# Patient Record
Sex: Female | Born: 1960 | Race: White | Hispanic: No | Marital: Married | Smoking: Never smoker
Health system: Southern US, Community
[De-identification: ages and names within clinical notes are randomized; demographics above are authoritative.]

## PROBLEM LIST (undated history)

## (undated) DIAGNOSIS — F419 Anxiety disorder, unspecified: Secondary | ICD-10-CM

## (undated) DIAGNOSIS — Z8249 Family history of ischemic heart disease and other diseases of the circulatory system: Secondary | ICD-10-CM

## (undated) DIAGNOSIS — M1990 Unspecified osteoarthritis, unspecified site: Secondary | ICD-10-CM

## (undated) DIAGNOSIS — E785 Hyperlipidemia, unspecified: Secondary | ICD-10-CM

## (undated) DIAGNOSIS — I10 Essential (primary) hypertension: Secondary | ICD-10-CM

## (undated) DIAGNOSIS — T7840XA Allergy, unspecified, initial encounter: Secondary | ICD-10-CM

## (undated) DIAGNOSIS — E119 Type 2 diabetes mellitus without complications: Secondary | ICD-10-CM

## (undated) DIAGNOSIS — J45909 Unspecified asthma, uncomplicated: Secondary | ICD-10-CM

## (undated) DIAGNOSIS — J302 Other seasonal allergic rhinitis: Secondary | ICD-10-CM

## (undated) HISTORY — DX: Essential (primary) hypertension: I10

## (undated) HISTORY — DX: Unspecified osteoarthritis, unspecified site: M19.90

## (undated) HISTORY — DX: Hyperlipidemia, unspecified: E78.5

## (undated) HISTORY — DX: Allergy, unspecified, initial encounter: T78.40XA

## (undated) HISTORY — DX: Family history of ischemic heart disease and other diseases of the circulatory system: Z82.49

## (undated) HISTORY — PX: CHOLECYSTECTOMY: SHX55

## (undated) HISTORY — PX: EYE SURGERY: SHX253

## (undated) HISTORY — DX: Type 2 diabetes mellitus without complications: E11.9

## (undated) HISTORY — DX: Unspecified asthma, uncomplicated: J45.909

## (undated) HISTORY — DX: Other seasonal allergic rhinitis: J30.2

## (undated) HISTORY — DX: Anxiety disorder, unspecified: F41.9

---

## 2001-09-22 ENCOUNTER — Encounter: Admission: RE | Admit: 2001-09-22 | Discharge: 2001-09-22 | Payer: Self-pay

## 2001-09-22 ENCOUNTER — Encounter: Payer: Self-pay | Admitting: Family Medicine

## 2003-10-22 ENCOUNTER — Encounter: Admission: RE | Admit: 2003-10-22 | Discharge: 2004-01-20 | Payer: Self-pay | Admitting: Family Medicine

## 2004-11-18 ENCOUNTER — Encounter: Admission: RE | Admit: 2004-11-18 | Discharge: 2004-11-18 | Payer: Self-pay | Admitting: Family Medicine

## 2005-12-07 ENCOUNTER — Encounter: Admission: RE | Admit: 2005-12-07 | Discharge: 2005-12-07 | Payer: Self-pay | Admitting: Family Medicine

## 2007-01-12 ENCOUNTER — Encounter: Admission: RE | Admit: 2007-01-12 | Discharge: 2007-01-12 | Payer: Self-pay | Admitting: Family Medicine

## 2008-01-18 ENCOUNTER — Ambulatory Visit: Payer: Self-pay | Admitting: Pulmonary Disease

## 2008-01-18 DIAGNOSIS — J309 Allergic rhinitis, unspecified: Secondary | ICD-10-CM | POA: Insufficient documentation

## 2008-01-18 DIAGNOSIS — J45909 Unspecified asthma, uncomplicated: Secondary | ICD-10-CM | POA: Insufficient documentation

## 2008-01-18 DIAGNOSIS — R05 Cough: Secondary | ICD-10-CM

## 2008-01-18 DIAGNOSIS — Z9089 Acquired absence of other organs: Secondary | ICD-10-CM

## 2008-02-06 ENCOUNTER — Telehealth (INDEPENDENT_AMBULATORY_CARE_PROVIDER_SITE_OTHER): Payer: Self-pay | Admitting: *Deleted

## 2008-02-09 ENCOUNTER — Telehealth: Payer: Self-pay | Admitting: Pulmonary Disease

## 2008-02-23 ENCOUNTER — Encounter: Admission: RE | Admit: 2008-02-23 | Discharge: 2008-02-23 | Payer: Self-pay | Admitting: Family Medicine

## 2012-10-04 HISTORY — PX: COLONOSCOPY: SHX174

## 2013-07-24 ENCOUNTER — Ambulatory Visit: Payer: Self-pay | Admitting: *Deleted

## 2014-10-28 ENCOUNTER — Other Ambulatory Visit: Payer: Self-pay

## 2014-10-28 DIAGNOSIS — Z1231 Encounter for screening mammogram for malignant neoplasm of breast: Secondary | ICD-10-CM

## 2014-11-05 ENCOUNTER — Ambulatory Visit
Admission: RE | Admit: 2014-11-05 | Discharge: 2014-11-05 | Disposition: A | Discharge status: Home / Self Care | Payer: 59 | Source: Ambulatory Visit

## 2014-11-05 DIAGNOSIS — Z1231 Encounter for screening mammogram for malignant neoplasm of breast: Secondary | ICD-10-CM

## 2016-01-02 ENCOUNTER — Other Ambulatory Visit: Payer: Self-pay | Admitting: Nurse Practitioner

## 2016-01-02 ENCOUNTER — Ambulatory Visit
Admission: RE | Admit: 2016-01-02 | Discharge: 2016-01-02 | Disposition: A | Discharge status: Home / Self Care | Payer: Self-pay | Source: Ambulatory Visit | Attending: Internal Medicine | Admitting: Internal Medicine

## 2016-01-02 DIAGNOSIS — R05 Cough: Secondary | ICD-10-CM

## 2016-01-02 DIAGNOSIS — R053 Chronic cough: Secondary | ICD-10-CM

## 2016-03-25 ENCOUNTER — Other Ambulatory Visit: Payer: Self-pay | Admitting: Internal Medicine

## 2016-03-25 DIAGNOSIS — Z1231 Encounter for screening mammogram for malignant neoplasm of breast: Secondary | ICD-10-CM

## 2016-03-29 ENCOUNTER — Ambulatory Visit
Admission: RE | Admit: 2016-03-29 | Discharge: 2016-03-29 | Disposition: A | Discharge status: Home / Self Care | Payer: 59 | Source: Ambulatory Visit | Attending: Internal Medicine | Admitting: Internal Medicine

## 2016-03-29 DIAGNOSIS — Z1231 Encounter for screening mammogram for malignant neoplasm of breast: Secondary | ICD-10-CM

## 2017-03-17 ENCOUNTER — Telehealth: Payer: Self-pay | Admitting: General Practice

## 2017-03-17 NOTE — Telephone Encounter (Signed)
Please call patient with paper referral for new patient with dr Elvera Lennoxgherghe.

## 2017-03-21 NOTE — Telephone Encounter (Signed)
[facility] called to check the status of the referral for the patient. Please call [facility] at [phone] to advise and ask for Katie (overhead page)

## 2017-03-21 NOTE — Telephone Encounter (Signed)
Please advise thank you

## 2017-03-30 NOTE — Telephone Encounter (Signed)
Katie (med sec) [facility] calling checking on [facility]o referral. When calling her, please request to have her Overhead Paged. On lunch 12pm to 1pm.  She is re-faxing the referral a 3rd time.  Thank you,  -LL

## 2017-03-31 NOTE — Telephone Encounter (Signed)
Pt has been scheduled.  °

## 2017-04-10 NOTE — Progress Notes (Signed)
Cardiology Office Note   Date:  04/11/2017   ID:  Charlotte Horn, DOB [birth year], MRN [id number]  PCP:  Charlotte RanchSchoenhoff, Deborah D, MD  Cardiologist:   Charlotte Siiffany Morley, MD   Chief Complaint  Patient presents with  . New Evaluation    HIGH CHOLESTEROL AND FAMILY HISTORY OF HEART DISEASE      History of Present Illness: Charlotte Horn is a 56 y.o. female with diabetes, asthma and hyperlipidemia who presents for an evaluation of CAD risk.  Charlotte Horn last saw Dr. Constance GoltzSchoenhoff 04/05/17 and was referred to cardiology due to hyperlipidemia with statin intolerance.She has tried simvastatin, atorvastatin, rosuvastatin, pravastatin, and pitavastatin and been unable to tolerate them due to myalgias. She has has a strong family history of CAD.  Her father Was first heart attack at age 56. He subsequently had coronary artery bypass grafting and multiple stents. Last year her brother also died of a cardiac arrest. Both her father and brother had hyperlipidemia and diabetes.  She denies chest pain or shortness of breath.  She has occasional mild lower extremity edema that improves with elevation.  She notes mild orthostasis but no syncope.  Charlotte Horn does some exercise on the treadmill and weight machine at home but is not faithful.   She has no exertional symptoms.  She reports that she was diagnosed with diabetes in 2004 and endorses numbness and tingling in bilateral hands and feet.  Past Medical History:  Diagnosis Date  . Family history of early CAD 04/11/2017  . Hyperlipidemia 04/11/2017    No past surgical history on file.   Current Outpatient Prescriptions  Medication Sig Dispense Refill  . aspirin EC 81 MG tablet Take 81 mg by mouth daily.    . Alpha-Lipoic Acid 100 MG CAPS Take 1 capsule by mouth daily.    Marland Kitchen. b complex vitamins capsule Take 1 capsule by mouth daily.    . benzonatate (TESSALON) 200 MG capsule Take 1 capsule by mouth daily as needed.  0  . cholecalciferol (VITAMIN Horn)  1000 units tablet Take 5,000 Units by mouth daily.    . Cranberry 125 MG TABS Take 1 tablet by mouth daily.    Marland Kitchen. ezetimibe (ZETIA) 10 MG tablet Take 1 tablet (10 mg total) by mouth daily. 30 tablet 5  . fexofenadine (ALLEGRA) 180 MG tablet Take 1 tablet by mouth daily.    Marland Kitchen. glipiZIDE (GLUCOTROL XL) 5 MG 24 hr tablet Take 1 tablet by mouth daily.    Marland Kitchen. HUMALOG 100 UNIT/ML injection See admin instructions. per sliding scale  0  . ipratropium (ATROVENT) 0.06 % nasal spray Place 1 spray into both nostrils.    Marland Kitchen. losartan (COZAAR) 100 MG tablet Take 1 tablet by mouth daily.    . metFORMIN (GLUCOPHAGE) 500 MG tablet Take 1,000 mg by mouth 2 (two) times daily.    . Multiple Vitamin (THERA) TABS Take 1 tablet by mouth daily.    . Omega-3 1000 MG CAPS Take 1 capsule by mouth daily.    . Omega-3 Fatty Acids (FISH OIL) 1000 MG CAPS Take 1 capsule by mouth daily.    Marland Kitchen. PROAIR HFA 108 (90 Base) MCG/ACT inhaler Inhale 2 puffs into the lungs as needed.  0  . sertraline (ZOLOFT) 100 MG tablet Take 1 tablet by mouth daily.    . traZODone (DESYREL) 100 MG tablet Take 1 tablet by mouth at bedtime.     No current facility-administered medications for this visit.  Allergies:   Prednisone    Social History:  The patient  reports that she has never smoked. She has never used smokeless tobacco.   Family History:  The patient's family history includes Heart attack in her father; Heart failure in her paternal grandmother; Kidney failure in her father; Ovarian cancer in her maternal grandmother and mother; Parkinson's disease in her maternal grandfather; Sudden Cardiac Death in her brother.    ROS:  Please see the history of present illness.   Otherwise, review of systems are positive for none.   All other systems are reviewed and negative.    PHYSICAL EXAM: VS:  BP 103/70 (BP Location: Right Arm)   Pulse 67   Ht 5\' 9"  (1.753 m)   Wt 77.1 kg (170 lb)   BMI 25.10 kg/m  , BMI Body mass index is 25.1  kg/m. GENERAL:  Well appearing HEENT:  Pupils equal round and reactive, fundi not visualized, oral mucosa unremarkable NECK:  No jugular venous distention, waveform within normal limits, carotid upstroke brisk and symmetric, no bruits, no thyromegaly LYMPHATICS:  No cervical adenopathy LUNGS:  Clear to auscultation bilaterally HEART:  RRR.  PMI not displaced or sustained,S1 and S2 within normal limits, no S3, no S4, no clicks, no rubs, no murmurs ABD:  Flat, positive bowel sounds normal in frequency in pitch, no bruits, no rebound, no guarding, no midline pulsatile mass, no hepatomegaly, no splenomegaly EXT:  2 plus pulses throughout, no edema, no cyanosis no clubbing SKIN:  No rashes no nodules NEURO:  Cranial nerves II through XII grossly intact, motor grossly intact throughout PSYCH:  Cognitively intact, oriented to person place and time   EKG:  EKG is ordered today. The ekg ordered today demonstrates sinus rhythm.  Rate 67 bpm.  First degree AV block    Recent Labs: No results found for requested labs within last 8760 hours.   02/01/17:  Sodium 141, potassium 4.8, BUN 14, creatinine 0.73 AST 16, ALT 17 Total cholesterol 253, triglycerides 166, HDL 59, LDL 161 Hemoglobin A1c 7.0% TSH 4.31 WBC 4.7, hemoglobin 11.5, hematocrit 34.9, platelets 248   Lipid Panel No results found for: CHOL, TRIG, HDL, CHOLHDL, VLDL, LDLCALC, LDLDIRECT    Wt Readings from Last 3 Encounters:  04/11/17 77.1 kg (170 lb)  01/18/08 85.8 kg (189 lb 4 oz)      ASSESSMENT AND PLAN:  # Hyperlipidemia: # Family history of CAD: Ms. Dot Been has Hyperlipidemia and has been intolerant to several statins. We will try Zetia 10 mg daily. She does not have any evidence of familial hyperlipidemia. If she does not tolerate Zetia or her lipids do not get to goal we will obtain a coronary calcium score to determine if she has any coronary artery disease.   # CV Risk prevention: Continue aspirin 81mg  daily.   Consider calcium score as above.   Current medicines are reviewed at length with the patient today.  The patient does not have concerns regarding medicines.  The following changes have been made:  Start Zetia 10 mg daily   Labs/ tests ordered today include:   Orders Placed This Encounter  Procedures  . Lipid panel     Disposition:   FU with Ronnette Rump C. Duke Salvia, MD, [facility] in 2 months.    This note was written with the assistance of speech recognition software.  Please excuse any transcriptional errors.  Signed, Toua Stites C. Duke Salvia, MD, [facility]  04/11/2017 4:38 PM    [facility]

## 2017-04-11 ENCOUNTER — Encounter: Payer: Self-pay | Admitting: Cardiovascular Disease

## 2017-04-11 ENCOUNTER — Encounter (INDEPENDENT_AMBULATORY_CARE_PROVIDER_SITE_OTHER): Payer: Self-pay

## 2017-04-11 ENCOUNTER — Ambulatory Visit (INDEPENDENT_AMBULATORY_CARE_PROVIDER_SITE_OTHER): Payer: 59 | Admitting: Cardiovascular Disease

## 2017-04-11 VITALS — BP 103/70 | HR 67 | Ht 69.0 in | Wt 170.0 lb

## 2017-04-11 DIAGNOSIS — E785 Hyperlipidemia, unspecified: Secondary | ICD-10-CM

## 2017-04-11 DIAGNOSIS — E1169 Type 2 diabetes mellitus with other specified complication: Secondary | ICD-10-CM | POA: Insufficient documentation

## 2017-04-11 DIAGNOSIS — Z8249 Family history of ischemic heart disease and other diseases of the circulatory system: Secondary | ICD-10-CM

## 2017-04-11 DIAGNOSIS — E7800 Pure hypercholesterolemia, unspecified: Secondary | ICD-10-CM

## 2017-04-11 DIAGNOSIS — E782 Mixed hyperlipidemia: Secondary | ICD-10-CM | POA: Insufficient documentation

## 2017-04-11 HISTORY — DX: Family history of ischemic heart disease and other diseases of the circulatory system: Z82.49

## 2017-04-11 HISTORY — DX: Hyperlipidemia, unspecified: E78.5

## 2017-04-11 MED ORDER — EZETIMIBE 10 MG PO TABS: 10.0000 mg | ORAL_TABLET | Freq: Every day | ORAL | 5 refills | Status: DC

## 2017-04-11 NOTE — Patient Instructions (Signed)
Medication Instructions:  START ZETIA 10 MG DAILY  Labwork: FASTING LIPID 6 WEEKS AFTER STARTING ZETIA   Testing/Procedures: NONE  Follow-Up: Your physician recommends that you schedule a follow-up appointment in: 2 MONTH OV  If you need a refill on your cardiac medications before your next appointment, please call your pharmacy.

## 2017-04-11 NOTE — Addendum Note (Signed)
Addended by: Regis BillPRATT, Allison Silva B on: 04/11/2017 06:21 PM   Modules accepted: Orders

## 2017-05-05 ENCOUNTER — Other Ambulatory Visit: Payer: Self-pay | Admitting: Internal Medicine

## 2017-05-05 DIAGNOSIS — Z1231 Encounter for screening mammogram for malignant neoplasm of breast: Secondary | ICD-10-CM

## 2017-05-11 ENCOUNTER — Ambulatory Visit: Payer: 59

## 2017-05-12 ENCOUNTER — Ambulatory Visit
Admission: RE | Admit: 2017-05-12 | Discharge: 2017-05-12 | Disposition: A | Discharge status: Home / Self Care | Payer: 59 | Source: Ambulatory Visit | Attending: Internal Medicine | Admitting: Internal Medicine

## 2017-05-12 DIAGNOSIS — Z1231 Encounter for screening mammogram for malignant neoplasm of breast: Secondary | ICD-10-CM

## 2017-06-01 LAB — LIPID PANEL
CHOL/HDL RATIO: 3.5 ratio (ref 0.0–4.4)
Cholesterol, Total: 195 mg/dL (ref 100–199)
HDL: 55 mg/dL (ref >39)
LDL Calculated: 105 mg/dL — ABNORMAL HIGH (ref 0–99)
Triglycerides: 173 mg/dL — ABNORMAL HIGH (ref 0–149)
VLDL Cholesterol Cal: 35 mg/dL (ref 5–40)

## 2017-06-10 ENCOUNTER — Ambulatory Visit (INDEPENDENT_AMBULATORY_CARE_PROVIDER_SITE_OTHER): Payer: 59 | Admitting: Internal Medicine

## 2017-06-10 ENCOUNTER — Encounter: Payer: Self-pay | Admitting: Internal Medicine

## 2017-06-10 VITALS — BP 118/64 | HR 74 | Ht 69.0 in | Wt 165.8 lb

## 2017-06-10 DIAGNOSIS — E1165 Type 2 diabetes mellitus with hyperglycemia: Secondary | ICD-10-CM | POA: Diagnosis not present

## 2017-06-10 DIAGNOSIS — E1142 Type 2 diabetes mellitus with diabetic polyneuropathy: Secondary | ICD-10-CM | POA: Diagnosis not present

## 2017-06-10 LAB — POCT GLYCOSYLATED HEMOGLOBIN (HGB A1C): Hemoglobin A1C: 7.8

## 2017-06-10 MED ORDER — FREESTYLE LIBRE SENSOR SYSTEM MISC
1.0000 | 11 refills | Status: DC
Start: 2017-06-10 — End: 2018-02-16

## 2017-06-10 MED ORDER — GLIPIZIDE ER 5 MG PO TB24: 5.0000 mg | ORAL_TABLET | Freq: Every day | ORAL | 3 refills | Status: DC

## 2017-06-10 MED ORDER — METFORMIN HCL ER 500 MG PO TB24: 1000.0000 mg | ORAL_TABLET | Freq: Two times a day (BID) | ORAL | 3 refills | Status: DC

## 2017-06-10 MED ORDER — FREESTYLE LIBRE READER DEVI: 1.0000 | Freq: Three times a day (TID) | 1 refills | Status: DC

## 2017-06-10 NOTE — Addendum Note (Signed)
Addended by: Lear NgMARTIN, MISTY L on: 06/10/2017 09:15 AM   Modules accepted: Orders

## 2017-06-10 NOTE — Patient Instructions (Addendum)
Please move all Metformin ER at dinnertime (2000 mg).  Please continue: - Glipizide 5 mg with dinner  Please let me know if the sugars are consistently <80 or >200.  Please return in 1.5 months with your sugar log.   PATIENT INSTRUCTIONS FOR TYPE 2 DIABETES:  **Please join MyChart!** - see attached instructions about how to join if you have not done so already.  DIET AND EXERCISE Diet and exercise is an important part of diabetic treatment.  We recommended aerobic exercise in the form of brisk walking (working between 40-60% of maximal aerobic capacity, similar to brisk walking) for 150 minutes per week (such as 30 minutes five days per week) along with 3 times per week performing 'resistance' training (using various gauge rubber tubes with handles) 5-10 exercises involving the major muscle groups (upper body, lower body and core) performing 10-15 repetitions (or near fatigue) each exercise. Start at half the above goal but build slowly to reach the above goals. If limited by weight, joint pain, or disability, we recommend daily walking in a swimming pool with water up to waist to reduce pressure from joints while allow for adequate exercise.    BLOOD GLUCOSES Monitoring your blood glucoses is important for continued management of your diabetes. Please check your blood glucoses 2-4 times a day: fasting, before meals and at bedtime (you can rotate these measurements - e.g. one day check before the 3 meals, the next day check before 2 of the meals and before bedtime, etc.).   HYPOGLYCEMIA (low blood sugar) Hypoglycemia is usually a reaction to not eating, exercising, or taking too much insulin/ other diabetes drugs.  Symptoms include tremors, sweating, hunger, confusion, headache, etc. Treat IMMEDIATELY with 15 grams of Carbs: . 4 glucose tablets .  cup regular juice/soda . 2 tablespoons raisins . 4 teaspoons sugar . 1 tablespoon honey Recheck blood glucose in 15 mins and repeat above if  still symptomatic/blood glucose <100.  RECOMMENDATIONS TO REDUCE YOUR RISK OF DIABETIC COMPLICATIONS: * Take your prescribed MEDICATION(S) * Follow a DIABETIC diet: Complex carbs, fiber rich foods, (monounsaturated and polyunsaturated) fats * AVOID saturated/trans fats, high fat foods, >2,300 mg salt per day. * EXERCISE at least 5 times a week for 30 minutes or preferably daily.  * DO NOT SMOKE OR DRINK more than 1 drink a day. * Check your FEET every day. Do not wear tightfitting shoes. Contact us if you develop an ulcer * See your EYE doctor once a year or more if needed * Get a FLU shot once a year * Get a PNEUMONIA vaccine once before and once after age 59 years  GOALS:  * Your Hemoglobin A1c of <7%  * fasting sugars need to be <130 * after meals sugars need to be <180 (2h after you start eating) * Your Systolic BP should be 140 or lower  * Your Diastolic BP should be 80 or lower  * Your HDL (Good Cholesterol) should be 40 or higher  * Your LDL (Bad Cholesterol) should be 100 or lower. * Your Triglycerides should be 150 or lower  * Your Urine microalbumin (kidney function) should be <30 * Your Body Mass Index should be 25 or lower    Please consider the following ways to cut down carbs and fat and increase fiber and micronutrients in your diet: - substitute whole grain for white bread or pasta - substitute brown rice for white rice - substitute 90-calorie flat bread pieces for slices of bread when possible -  substitute sweet potatoes or yams for white potatoes - substitute humus for margarine - substitute tofu for cheese when possible - substitute almond or rice milk for regular milk (would not drink soy milk daily due to concern for soy estrogen influence on breast cancer risk) - substitute dark chocolate for other sweets when possible - substitute water - can add lemon or orange slices for taste - for diet sodas (artificial sweeteners will trick your body that you can eat  sweets without getting calories and will lead you to overeating and weight gain in the long run) - do not skip breakfast or other meals (this will slow down the metabolism and will result in more weight gain over time)  - can try smoothies made from fruit and almond/rice milk in am instead of regular breakfast - can also try old-fashioned (not instant) oatmeal made with almond/rice milk in am - order the dressing on the side when eating salad at a restaurant (pour less than half of the dressing on the salad) - eat as little meat as possible - can try juicing, but should not forget that juicing will get rid of the fiber, so would alternate with eating raw veg./fruits or drinking smoothies - use as little oil as possible, even when using olive oil - can dress a salad with a mix of balsamic vinegar and lemon juice, for e.g. - use agave nectar, stevia sugar, or regular sugar rather than artificial sweateners - steam or broil/roast veggies  - snack on veggies/fruit/nuts (unsalted, preferably) when possible, rather than processed foods - reduce or eliminate aspartame in diet (it is in diet sodas, chewing gum, etc) Read the labels!  Try to read Dr. Katherina RightNeal Barnard's book: "Program for Reversing Diabetes" for other ideas for healthy eating.

## 2017-06-10 NOTE — Progress Notes (Signed)
Patient ID: Charlotte Horn, female   DOB: [birth year], 56 y.o.   MRN: [id number]   HPI: Charlotte Horn is a 56 y.o.-year-old female, referred by her PCP, Schoenhoff, Harrington Challengereborah D, MD for management of DM2, dx in 2004, non-insulin-dependent, uncontrolled, without complications.  In 02/2017 >> she had bronchitis >> Prednisone po and inhaled >> sugars higher in 05 and 03/2017.  Last hemoglobin A1c was: 05/12/2017: HbA1c 9.1% 02/01/2017: HbA1c 7.0% No results found for: HGBA1C  Pt is on a regimen of: - Metformin ER 1000 mg 2x a day, with meals - Glipizide XL 5 mg with dinner She tried Byetta, Bydureon (1 year) - but had diarrhea. She had very low CBGs with regular Glipizide.  Pt checks her sugars 1x a day and they are: - am: 139-180, 237 (sweets) - 2h after b'fast: n/c - before lunch: n/c - 2h after lunch: 150-160 - before dinner: n/c - 2h after dinner: n/c - bedtime: (late dinner): 150-160s - nighttime: n/c No lows. Lowest sugar was 45 (lowest ever) >> last 6 mo: 110 (Bydureon - stopped this in 12/2016); she has hypoglycemia awareness at 60-90s.  Highest sugar was 237 (but 385 with prednisone). Normally <200.  Glucometer: One Touch Ultra mini  Pt's meals are: - Breakfast: b'fast bar (<15g carbs), almonds, cereal - Lunch: sandwich, veggies - Dinner: meat + veggies + rarely starch - Snacks:  Diet sodas usually lower her sugar.  - no CKD, last BUN/creatinine:  02/01/2017: Glucose 185, BUN/creatinine 14/0.73, GFR 93/107. No results found for: BUN, CREATININE  On losartan.  - She does have hyperlipidemia; last set of lipids: Lab Results  Component Value Date   CHOL 195 06/01/2017   HDL 55 06/01/2017   LDLCALC 105 (H) 06/01/2017   TRIG 173 (H) 06/01/2017   CHOLHDL 3.5 06/01/2017  Previously: 02/01/2017: 253/166/59/161  On ezetimibe (started 04/2017), fish/flax oil, co-Q10. Managed by cardiology..   - last eye exam was in 07/2017 >> No DR. Dr. Audie BoxFontaine.  - + numbness  and tingling in her feet. On alpha lipoic acid 300 mg twice a day, B complex.   On ASA 81 mg daily.   Pt has FH of DM in father, PGM, 2 brothers, nephews  Latest TSH: 02/01/2017: 4.310   ROS: Constitutional: no weight gain/loss, no fatigue, no subjective hyperthermia/hypothermia Eyes: no blurry vision, no xerophthalmia ENT: no sore throat, no nodules palpated in throat, no dysphagia/odynophagia, no hoarseness Cardiovascular: no CP/SOB/palpitations/leg swelling Respiratory: no cough/SOB Gastrointestinal: no N/V/D/C Musculoskeletal: no muscle/joint aches Skin: no rashes Neurological: no tremors/+ numbness/+ tingling/dizziness Psychiatric: no depression/+ anxiety  Past Medical History:  Diagnosis Date  . Family history of early CAD 04/11/2017  . Hyperlipidemia 04/11/2017   PSx H: - multiple eye Sx's: [id number] - C-section 1996 - Gall Bladder Sx 1984  Social History   Social History  . Marital status: Married    Spouse name: N/A  . Number of children: 1   Occupational History  . Reimbursement analyst   Social History Main Topics  . Smoking status: Never Smoker  . Smokeless tobacco: Never Used  . Alcohol use No  . Drug use: No   Current Outpatient Prescriptions on File Prior to Visit  Medication Sig Dispense Refill  . Alpha-Lipoic Acid 100 MG CAPS Take 1 capsule by mouth daily.    Marland Kitchen. aspirin EC 81 MG tablet Take 81 mg by mouth daily.    Marland Kitchen. b complex vitamins capsule Take 1 capsule by mouth daily.    .Marland Kitchen  benzonatate (TESSALON) 200 MG capsule Take 1 capsule by mouth daily as needed.  0  . cholecalciferol (VITAMIN D) 1000 units tablet Take 5,000 Units by mouth daily.    . Cranberry 125 MG TABS Take 1 tablet by mouth daily.    Marland Kitchen ezetimibe (ZETIA) 10 MG tablet Take 1 tablet (10 mg total) by mouth daily. 30 tablet 5  . fexofenadine (ALLEGRA) 180 MG tablet Take 1 tablet by mouth daily.    Marland Kitchen glipiZIDE (GLUCOTROL XL) 5 MG 24 hr tablet Take 1 tablet by mouth daily.    Marland Kitchen ipratropium  (ATROVENT) 0.06 % nasal spray Place 1 spray into both nostrils.    Marland Kitchen losartan (COZAAR) 100 MG tablet Take 1 tablet by mouth daily.    . Multiple Vitamin (THERA) TABS Take 1 tablet by mouth daily.    . Omega-3 Fatty Acids (FISH OIL) 1000 MG CAPS Take 2 capsules by mouth daily.     . sertraline (ZOLOFT) 100 MG tablet Take 1 tablet by mouth daily.    . traZODone (DESYREL) 100 MG tablet Take 1 tablet by mouth at bedtime.    Marland Kitchen PROAIR HFA 108 (90 Base) MCG/ACT inhaler Inhale 2 puffs into the lungs as needed.  0   No current facility-administered medications on file prior to visit.    Allergies  Allergen Reactions  . Statins Other (See Comments)    MUSCLE ACHES  . Prednisone    Family History  Problem Relation Age of Onset  . Ovarian cancer Mother   . Heart attack Father   . Kidney failure Father   . Sudden Cardiac Death Brother   . Ovarian cancer Maternal Grandmother   . Parkinson's disease Maternal Grandfather   . Heart failure Paternal Grandmother   . Breast cancer Neg Hx     PE: BP 118/64   Pulse 74   Ht  (1.753 m)   Wt 165 lb 12.8 oz (75.2 kg)   SpO2 96%   BMI 24.48 kg/m  Wt Readings from Last 3 Encounters:  06/10/17 165 lb 12.8 oz (75.2 kg)  04/11/17 170 lb (77.1 kg)  01/18/08 189 lb 4 oz (85.8 kg)   Constitutional: normal weight, in NAD Eyes: PERRLA, EOMI, no exophthalmos, + B palpebral ptosis ENT: moist mucous membranes, no thyromegaly, no cervical lymphadenopathy Cardiovascular: RRR, No MRG Respiratory: CTA B Gastrointestinal: abdomen soft, NT, ND, BS+ Musculoskeletal: no deformities, strength intact in all 4 Skin: moist, warm, no rashes Neurological: no tremor with outstretched hands, DTR normal in all 4  ASSESSMENT: 1. 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 Patient with long-standing, uncontrolled diabetes, on oral antidiabetic regimen, with worse control in last 4 mo 2/2 steroids and coming  off GLP1 R agonist. After her steroids were stopped, she saw a decrease in CBGs but they are still quite high in am. Later in the day, she usually checks after meals, and they are in the 150-160s, his close to target.  - We discussed about improving her evening meals but also I advised her to move all the metformin ER to dinnertime since practice have been shown to be able to decrease fasting CBGs by up to 30 points - She is also telling me that she frequently forgets glipizide XL so we discussed about the importance of getting this in before dinner, also  - we also discussed about the possibility of adding Trulicity or an SGLT2 inhibitor. We may need to do  this at next visit, if sugars not improving. - I suggested to:  Patient Instructions  Please move all Metformin ER at dinnertime (2000 mg).  Please continue: - Glipizide 5 mg with dinner  Please let me know if the sugars are consistently <80 or >200.  Please return in 1.5 months with your sugar log.   - advised her to start checking sugars at different times of the day - check once a day, rotating checks - given sugar log and advised how to fill it and to bring it at next appt  - given foot care handout and explained the principles  - given instructions for hypoglycemia management "15-15 rule"  - advised for yearly eye exams >> she is UTD - Return to clinic in 1.5 mo with sugar l og   2. 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  - We will continue with the above supplements  Carlus Pavlov, MD PhD [facility]

## 2017-06-14 ENCOUNTER — Ambulatory Visit (INDEPENDENT_AMBULATORY_CARE_PROVIDER_SITE_OTHER): Payer: 59 | Admitting: Cardiovascular Disease

## 2017-06-14 ENCOUNTER — Encounter: Payer: Self-pay | Admitting: Cardiovascular Disease

## 2017-06-14 VITALS — BP 100/64 | HR 62 | Ht 69.0 in | Wt 167.0 lb

## 2017-06-14 DIAGNOSIS — E7800 Pure hypercholesterolemia, unspecified: Secondary | ICD-10-CM | POA: Diagnosis not present

## 2017-06-14 DIAGNOSIS — Z8249 Family history of ischemic heart disease and other diseases of the circulatory system: Secondary | ICD-10-CM | POA: Diagnosis not present

## 2017-06-14 NOTE — Progress Notes (Signed)
Cardiology Office Note   Date:  06/14/2017   ID:  Charlotte Horn, DOB May 07, 1961, MRN [id number]  PCP:  Kendrick Ranch, MD  Cardiologist:   Chilton Si, MD   Chief Complaint  Patient presents with  . Follow-up    foot tingling and numbness, cramping, wonders if side affect os zetia       History of Present Illness: Charlotte Horn is a 56 y.o. female with diabetes, asthma and hyperlipidemia who presents for follow up.  She was initially seen 04/2017 for an evaluation of CAD risk.  Charlotte Horn last saw Dr. Constance Goltz 04/05/17 and was referred to cardiology due to hyperlipidemia with statin intolerance.She has tried simvastatin, atorvastatin, rosuvastatin, pravastatin, and pitavastatin and been unable to tolerate them due to myalgias. She has has a strong family history of CAD.  Her father had an MI at age 66 and her brother died of cardiac arrest.  At her last appointment she was started on Zetia. Her LDL decreased from 161 to 469.  Since starting Zetia she has noted some muscle cramps.  She also notes increased numbness and tingling in her fingers and toes.  The symptoms are not as bad as when she was taking statins but still bothersome.  She has been working on her blood glucose levels and has been more physically active.  She notes some pain in her back but no chest pain or shortness of breath.  She has some mild lower extremity edema but no orthopnea or PND.    Past Medical History:  Diagnosis Date  . Family history of early CAD 04/11/2017  . Hyperlipidemia 04/11/2017    No past surgical history on file.   Current Outpatient Prescriptions  Medication Sig Dispense Refill  . Alpha-Lipoic Acid 100 MG CAPS Take 1 capsule by mouth daily.    Marland Kitchen aspirin EC 81 MG tablet Take 81 mg by mouth daily.    Marland Kitchen b complex vitamins capsule Take 1 capsule by mouth daily.    . benzonatate (TESSALON) 200 MG capsule Take 1 capsule by mouth daily as needed.  0  . cholecalciferol  (VITAMIN D) 1000 units tablet Take 5,000 Units by mouth daily.    Marland Kitchen co-enzyme Q-10 50 MG capsule Take 100 mg by mouth daily.    . Continuous Blood Gluc Receiver (FREESTYLE LIBRE READER) DEVI 1 Device by Does not apply route 3 (three) times daily. 1 Device 1  . Continuous Blood Gluc Sensor (FREESTYLE LIBRE SENSOR SYSTEM) MISC 1 Device by Does not apply route every 30 (thirty) days. 3 each 11  . Cranberry 125 MG TABS Take 1 tablet by mouth daily.    . cyclobenzaprine (FLEXERIL) 10 MG tablet Take 10 mg by mouth 3 (three) times daily as needed for muscle spasms.    Marland Kitchen ezetimibe (ZETIA) 10 MG tablet Take 1 tablet (10 mg total) by mouth daily. 30 tablet 5  . fexofenadine (ALLEGRA) 180 MG tablet Take 1 tablet by mouth daily.    . Flaxseed, Linseed, (FLAXSEED OIL) 1000 MG CAPS Take 1 capsule by mouth daily.    Marland Kitchen glipiZIDE (GLUCOTROL XL) 5 MG 24 hr tablet Take 1 tablet (5 mg total) by mouth daily. 90 tablet 3  . ipratropium (ATROVENT) 0.06 % nasal spray Place 1 spray into both nostrils.    Marland Kitchen levocetirizine (XYZAL) 5 MG tablet Take 5 mg by mouth every evening.    Marland Kitchen losartan (COZAAR) 100 MG tablet Take 1 tablet by mouth daily.    Marland Kitchen  Magnesium Oxide (MAG-200 PO) Take 1 tablet by mouth daily.    . metFORMIN (GLUCOPHAGE-XR) 500 MG 24 hr tablet Take 2 tablets (1,000 mg total) by mouth 2 (two) times daily. 360 tablet 3  . methocarbamol (ROBAXIN) 500 MG tablet Take 500 mg by mouth every 8 (eight) hours as needed for muscle spasms.    . Multiple Vitamin (THERA) TABS Take 1 tablet by mouth daily.    . Omega-3 Fatty Acids (FISH OIL) 1000 MG CAPS Take 2 capsules by mouth daily.     Marland Kitchen PROAIR HFA 108 (90 Base) MCG/ACT inhaler Inhale 2 puffs into the lungs as needed.  0  . sertraline (ZOLOFT) 100 MG tablet Take 1 tablet by mouth daily.    . traZODone (DESYREL) 100 MG tablet Take 1 tablet by mouth at bedtime.     No current facility-administered medications for this visit.     Allergies:   Statins and Prednisone     Social History:  The patient  reports that she has never smoked. She has never used smokeless tobacco.   Family History:  The patient's family history includes Heart attack in her father; Heart failure in her paternal grandmother; Kidney failure in her father; Ovarian cancer in her maternal grandmother and mother; Parkinson's disease in her maternal grandfather; Sudden Cardiac Death in her brother.    ROS:  Please see the history of present illness.   Otherwise, review of systems are positive for none.   All other systems are reviewed and negative.    PHYSICAL EXAM: VS:  BP 100/64   Pulse 62   Ht  (1.753 m)   Wt 75.8 kg (167 lb)   BMI 24.66 kg/m  , BMI Body mass index is 24.66 kg/m. GENERAL:  Well appearing.  No acute distress HEENT: Pupils equal round and reactive, fundi not visualized, oral mucosa unremarkable NECK:  No jugular venous distention, waveform within normal limits, carotid upstroke brisk and symmetric, no bruits, no thyromegaly LUNGS:  Clear to auscultation bilaterally HEART:  RRR.  PMI not displaced or sustained,S1 and S2 within normal limits, no S3, no S4, no clicks, no rubs, no murmurs ABD:  Flat, positive bowel sounds normal in frequency in pitch, no bruits, no rebound, no guarding, no midline pulsatile mass, no hepatomegaly, no splenomegaly EXT:  2 plus pulses throughout, no edema, no cyanosis no clubbing SKIN:  No rashes no nodules NEURO:  Cranial nerves II through XII grossly intact, motor grossly intact throughout PSYCH:  Cognitively intact, oriented to person place and time   EKG:  EKG is not ordered today. The ekg ordered today demonstrates sinus rhythm.  Rate 67 bpm.  First degree AV block    Recent Labs: No results found for requested labs within last 8760 hours.   02/01/17:  Sodium 141, potassium 4.8, BUN 14, creatinine 0.73 AST 16, ALT 17 Total cholesterol 253, triglycerides 166, HDL 59, LDL 161 Hemoglobin A1c 7.0% TSH 4.31 WBC 4.7,  hemoglobin 11.5, hematocrit 34.9, platelets 248   Lipid Panel    Component Value Date/Time   CHOL 195 06/01/2017 0804   TRIG 173 (H) 06/01/2017 0804   HDL 55 06/01/2017 0804   CHOLHDL 3.5 06/01/2017 0804   LDLCALC 105 (H) 06/01/2017 0804      Wt Readings from Last 3 Encounters:  06/14/17 75.8 kg (167 lb)  06/10/17 75.2 kg (165 lb 12.8 oz)  04/11/17 77.1 kg (170 lb)      ASSESSMENT AND PLAN:  # Hyperlipidemia: #  Family history of CAD: Charlotte Horn's LDL improved significantly. Zetia. However she has some mild muscle aches. She reports that these are tolerable for now. We will get a coronary calcium score to better understand her risk.  Based on this will determine whether she wants to continue on Zetia or consider a PCSK9 inhibitor.   # CV Risk prevention: Continue aspirin 81mg  daily.  Calcium score as above.   Current medicines are reviewed at length with the patient today.  The patient does not have concerns regarding medicines.  The following changes have been made:  None  Labs/ tests ordered today include:   Orders Placed This Encounter  Procedures  . CT CARDIAC SCORING     Disposition:   FU with Akela Pocius C. Duke Salviaandolph, MD, [facility] in 3 months.    This note was written with the assistance of speech recognition software.  Please excuse any transcriptional errors.  Signed, Adalai Perl C. Duke Salviaandolph, MD, [facility]  06/14/2017 8:17 AM     [facility]

## 2017-06-14 NOTE — Patient Instructions (Addendum)
Medication Instructions:  Your physician recommends that you continue on your current medications as directed. Please refer to the Current Medication list given to you today.  Labwork: NONE  Testing/Procedures: CORONARY CALCIUM SCORE, OUT OF POCKET COST $150  Follow-Up: Your physician recommends that you schedule a follow-up appointment in: 3 MONTH OV   If you need a refill on your cardiac medications before your next appointment, please call your pharmacy.

## 2017-06-28 ENCOUNTER — Ambulatory Visit (INDEPENDENT_AMBULATORY_CARE_PROVIDER_SITE_OTHER)
Admission: RE | Admit: 2017-06-28 | Discharge: 2017-06-28 | Disposition: A | Discharge status: Home / Self Care | Payer: Self-pay | Source: Ambulatory Visit | Attending: Cardiovascular Disease | Admitting: Cardiovascular Disease

## 2017-06-28 DIAGNOSIS — Z8249 Family history of ischemic heart disease and other diseases of the circulatory system: Secondary | ICD-10-CM

## 2017-07-01 ENCOUNTER — Telehealth: Payer: Self-pay | Admitting: Internal Medicine

## 2017-07-01 ENCOUNTER — Other Ambulatory Visit: Payer: Self-pay

## 2017-07-01 MED ORDER — FREESTYLE LIBRE READER DEVI: 1.0000 | Freq: Three times a day (TID) | 1 refills | Status: DC

## 2017-07-01 NOTE — Telephone Encounter (Signed)
Called patient and let her know that I sent over another script for the [place] device to read the sensors. She stated that she was able to get her sensors, just not the reader.

## 2017-07-01 NOTE — Telephone Encounter (Signed)
MEDICATION: Freestyle Libre reader/meter  PHARMACY: [id number] [place], [place] - [address] [phone] ([phone] (Fax)   IS THIS A 90 DAY SUPPLY : no  IS PATIENT OUT OF MEDICATION: yes  IF NOT; HOW MUCH IS LEFT:   LAST APPOINTMENT DATE: /14/2018  NEXT APPOINTMENT DATE:@Visit  date not found  OTHER COMMENTS: Patient already has the sensors   **Let patient know to contact pharmacy at the end of the day to make sure medication is ready. **  ** Please notify patient to allow 48-72 hours to process**  **Encourage patient to contact the pharmacy for refills or they can request refills through [facility]**

## 2017-07-01 NOTE — Telephone Encounter (Signed)
Please review

## 2017-07-05 ENCOUNTER — Telehealth: Payer: Self-pay | Admitting: *Deleted

## 2017-07-05 MED ORDER — EZETIMIBE 10 MG PO TABS: 10.0000 mg | ORAL_TABLET | Freq: Every day | ORAL | 3 refills | Status: DC

## 2017-07-05 NOTE — Telephone Encounter (Signed)
-----   Message from Chilton Si, MD sent at 06/30/2017  3:42 PM EDT ----- Coronary calcium score is 0. This is great news. She has no CAD.  Based on this I would not start a PCSK9 inhibitor.  Reasonable to start Zetia 10 mg daily.

## 2017-07-05 NOTE — Telephone Encounter (Signed)
Advised patient of results and refilled Zetia as requested

## 2017-07-25 ENCOUNTER — Other Ambulatory Visit: Payer: Self-pay | Admitting: Internal Medicine

## 2017-07-25 ENCOUNTER — Ambulatory Visit
Admission: RE | Admit: 2017-07-25 | Discharge: 2017-07-25 | Disposition: A | Discharge status: Home / Self Care | Payer: 59 | Source: Ambulatory Visit | Attending: Internal Medicine | Admitting: Internal Medicine

## 2017-07-25 DIAGNOSIS — R52 Pain, unspecified: Secondary | ICD-10-CM

## 2017-07-25 DIAGNOSIS — T1490XA Injury, unspecified, initial encounter: Secondary | ICD-10-CM

## 2017-08-08 ENCOUNTER — Telehealth: Payer: Self-pay | Admitting: Internal Medicine

## 2017-08-08 ENCOUNTER — Telehealth: Payer: Self-pay

## 2017-08-08 NOTE — Telephone Encounter (Signed)
Called to see if patient would agree to take Trulicity medication, will send in once she agrees. Left message and advised patient to call back to let me know.

## 2017-08-08 NOTE — Telephone Encounter (Signed)
Called patient she states that her blood sugars have been staying in the 200's throughout the day.  Today: 11:57 am- 194 (fasting) 1:35 pm- 263  Yesterday: 12:00 pm- 214 5:10 pm- 244 8:42 pm- 275  November 3rd: 11:37 am- 151 2:33 pm- 211 7:05 pm- 237 Patient fasting that morning was 219.  Please advise, patient had to move her appointment with you, and does not see you until December now. Thank you!

## 2017-08-08 NOTE — Telephone Encounter (Signed)
Called to see if patient would agree to take Trulicity medication, will send in once she agrees. Left message and advised patient to call back to let me know.  

## 2017-08-08 NOTE — Telephone Encounter (Signed)
Patient stated that she will take the trulicity, and to let her know when to send it.

## 2017-08-08 NOTE — Telephone Encounter (Signed)
Would she agree to try Trulicity 0.75 mg once a week?  This is similar to the [place] that she tried in the past, but usually better tolerated. If so, let send one box of 4 pens but with 1 refill, but let us know before she refills it as may be we can use the higher dose at that time.

## 2017-08-08 NOTE — Telephone Encounter (Signed)
Raynelle FanningJulie, can you please send this?

## 2017-08-08 NOTE — Telephone Encounter (Signed)
Patient asked you to give her a call, she stated her b/s has been very high. Call her on her work [phone]

## 2017-08-09 ENCOUNTER — Other Ambulatory Visit: Payer: Self-pay

## 2017-08-09 MED ORDER — DULAGLUTIDE 0.75 MG/0.5ML ~~LOC~~ SOAJ: SUBCUTANEOUS | 2 refills | Status: DC

## 2017-08-09 NOTE — Telephone Encounter (Signed)
Submitted

## 2017-08-11 ENCOUNTER — Ambulatory Visit: Payer: 59 | Admitting: Internal Medicine

## 2017-08-12 ENCOUNTER — Telehealth: Payer: Self-pay | Admitting: Internal Medicine

## 2017-08-12 NOTE — Telephone Encounter (Signed)
Charlotte Horn  [facility] [address] is calling on the status of PA for trulicity P# [phone]  Please advise

## 2017-08-12 NOTE — Telephone Encounter (Signed)
Patient's prescription for Trulicity needs pre-authorization for her insurance company. [facility] [address] told patient they sent pre-authorization over for Dr-but have not received a response yet. Please send pre-authorization to [facility].

## 2017-08-16 NOTE — Telephone Encounter (Signed)
OK for PA

## 2017-08-16 NOTE — Telephone Encounter (Signed)
Did we decide to do a PA? I can do one if needed, just did not know Thanks!

## 2017-08-17 NOTE — Telephone Encounter (Signed)
Submitted PA via Covermymeds today.

## 2017-08-18 ENCOUNTER — Other Ambulatory Visit: Payer: Self-pay

## 2017-08-18 MED ORDER — DULAGLUTIDE 0.75 MG/0.5ML ~~LOC~~ SOAJ: SUBCUTANEOUS | 2 refills | Status: DC

## 2017-09-20 ENCOUNTER — Encounter: Payer: Self-pay | Admitting: Cardiovascular Disease

## 2017-09-20 ENCOUNTER — Encounter (INDEPENDENT_AMBULATORY_CARE_PROVIDER_SITE_OTHER): Payer: Self-pay

## 2017-09-20 ENCOUNTER — Ambulatory Visit: Payer: 59 | Admitting: Cardiovascular Disease

## 2017-09-20 VITALS — BP 110/56 | HR 82 | Ht 69.0 in | Wt 168.0 lb

## 2017-09-20 DIAGNOSIS — Z8249 Family history of ischemic heart disease and other diseases of the circulatory system: Secondary | ICD-10-CM | POA: Diagnosis not present

## 2017-09-20 DIAGNOSIS — E7800 Pure hypercholesterolemia, unspecified: Secondary | ICD-10-CM

## 2017-09-20 NOTE — Progress Notes (Signed)
Cardiology Office Note   Date:  09/20/2017   ID:  MIONNA ADVINCULA, DOB 03-07-1961, MRN [id number]  PCP:  Kendrick Ranch, MD  Cardiologist:   Chilton Si, MD   Chief Complaint  Horn presents with  . Follow-up      History of Present Illness: Charlotte Horn is a 56 y.o. female with diabetes, hypertension, asthma and hyperlipidemia who presents for follow up.  Charlotte Horn was initially seen 04/2017 for an evaluation of CAD risk.  Charlotte Horn last saw Dr. Constance Goltz 04/05/17 and was referred to cardiology due to hyperlipidemia with statin intolerance.  Charlotte Horn has tried simvastatin, atorvastatin, rosuvastatin, pravastatin, and pitavastatin and been unable to tolerate them due to myalgias. Charlotte Horn has has a strong family history of CAD.  Her father had an MI at age 66 and her brother died of cardiac arrest.  Charlotte Horn was started on Zetia and her LDL decreased from 161 to 409.  However Charlotte Horn developed muscle cramps.  After her last appointment Charlotte Horn was referred for a coronary calcium score 06/28/17 that showed a score of 0.  Since her last appointment Charlotte Horn has been feeling well.  Charlotte Horn restarted Zetia and has mild cramps in Charlotte left leg but they are tolerable.  Charlotte Horn has not noted any chest pain or shortness of breath.  Charlotte Horn is been very physically active because Charlotte Horn is currently remodeling her home.  Charlotte Horn has no exertional symptoms but feels very fatigued at Charlotte end of Charlotte weekend after working on Charlotte home for several hours each day.  Charlotte Horn had a fall in October and has some swelling in her knee.  Charlotte Horn has been participating in physical therapy for this.  Charlotte Horn also continues to have neuropathy that bothers her.  Charlotte Horn is otherwise well and without complaint today.  Charlotte Horn has not noted any lower extremity edema, orthopnea, or PND.  Her blood pressure has been well-controlled at home and Charlotte Horn denies any lightheadedness or dizziness.   Past Medical History:  Diagnosis Date  . Family history of early  CAD 04/11/2017  . Hyperlipidemia 04/11/2017    History reviewed. No pertinent surgical history.   Current Outpatient Medications  Medication Sig Dispense Refill  . Alpha-Lipoic Acid 100 MG CAPS Take 1 capsule by mouth daily.    Marland Kitchen aspirin EC 81 MG tablet Take 81 mg by mouth daily.    Marland Kitchen b complex vitamins capsule Take 1 capsule by mouth daily.    . benzonatate (TESSALON) 200 MG capsule Take 1 capsule by mouth daily as needed.  0  . cholecalciferol (VITAMIN D) 1000 units tablet Take 5,000 Units by mouth daily.    Marland Kitchen co-enzyme Q-10 50 MG capsule Take 100 mg by mouth daily.    . Continuous Blood Gluc Receiver (FREESTYLE LIBRE READER) DEVI 1 Device by Does not apply route 3 (three) times daily. 1 Device 1  . Continuous Blood Gluc Sensor (FREESTYLE LIBRE SENSOR SYSTEM) MISC 1 Device by Does not apply route every 30 (thirty) days. 3 each 11  . Cranberry 125 MG TABS Take 1 tablet by mouth daily.    . cyclobenzaprine (FLEXERIL) 10 MG tablet Take 10 mg by mouth 3 (three) times daily as needed for muscle spasms.    . Dulaglutide (TRULICITY) 0.75 MG/0.5ML SOPN Inject 0.75 mg weekly 4 pen 2  . ezetimibe (ZETIA) 10 MG tablet Take 1 tablet (10 mg total) by mouth daily. 90 tablet 3  . fexofenadine (ALLEGRA) 180 MG tablet Take 1  tablet by mouth daily.    . Flaxseed, Linseed, (FLAXSEED OIL) 1000 MG CAPS Take 1 capsule by mouth daily.    Marland Kitchen. glipiZIDE (GLUCOTROL XL) 5 MG 24 hr tablet Take 1 tablet (5 mg total) by mouth daily. 90 tablet 3  . ipratropium (ATROVENT) 0.06 % nasal spray Place 1 spray into both nostrils.    Marland Kitchen. losartan (COZAAR) 100 MG tablet Take 1 tablet by mouth daily.    . Magnesium Oxide (MAG-200 PO) Take 1 tablet by mouth daily.    . metFORMIN (GLUCOPHAGE-XR) 500 MG 24 hr tablet Take 2 tablets (1,000 mg total) by mouth 2 (two) times daily. 360 tablet 3  . methocarbamol (ROBAXIN) 500 MG tablet Take 500 mg by mouth every 8 (eight) hours as needed for muscle spasms.    . Multiple Vitamin (THERA) TABS  Take 1 tablet by mouth daily.    . Omega-3 Fatty Acids (FISH OIL) 1000 MG CAPS Take 2 capsules by mouth daily.     Marland Kitchen. PROAIR HFA 108 (90 Base) MCG/ACT inhaler Inhale 2 puffs into Charlotte lungs as needed.  0  . sertraline (ZOLOFT) 100 MG tablet Take 1 tablet by mouth daily.    . traZODone (DESYREL) 100 MG tablet Take 1 tablet by mouth at bedtime.    Marland Kitchen. levocetirizine (XYZAL) 5 MG tablet Take 5 mg by mouth every evening.     No current facility-administered medications for this visit.     Allergies:   Statins and Prednisone    Social History:  Charlotte Horn  reports that  has never smoked. Charlotte Horn has never used smokeless tobacco.   Family History:  Charlotte Horn's family history includes Heart attack in her father; Heart failure in her paternal grandmother; Kidney failure in her father; Ovarian cancer in her maternal grandmother and mother; Parkinson's disease in her maternal grandfather; Sudden Cardiac Death in her brother.    ROS:  Please see Charlotte history of present illness.   Otherwise, review of systems are positive for none.   All other systems are reviewed and negative.    PHYSICAL EXAM: VS:  BP (!) 110/56   Pulse 82   Ht 5\' 9"  (1.753 m)   Wt 168 lb (76.2 kg)   BMI 24.81 kg/m  , BMI Body mass index is 24.81 kg/m. GENERAL:  Well appearing HEENT: Pupils equal round and reactive, fundi not visualized, oral mucosa unremarkable NECK:  No jugular venous distention, waveform within normal limits, carotid upstroke brisk and symmetric, no bruits, no thyromegaly LUNGS:  Clear to auscultation bilaterally HEART:  RRR.  PMI not displaced or sustained,S1 and S2 within normal limits, no S3, no S4, no clicks, no rubs, no murmurs ABD:  Flat, positive bowel sounds normal in frequency in pitch, no bruits, no rebound, no guarding, no midline pulsatile mass, no hepatomegaly, no splenomegaly EXT:  2 plus pulses throughout, no edema, no cyanosis no clubbing SKIN:  No rashes no nodules NEURO:  Cranial nerves II  through XII grossly intact, motor grossly intact throughout PSYCH:  Cognitively intact, oriented to person place and time    EKG:  EKG is not ordered today. Charlotte ekg ordered today demonstrates sinus rhythm.  Rate 67 bpm.  First degree AV block    Recent Labs: No results found for requested labs within last 8760 hours.   02/01/17:  Sodium 141, potassium 4.8, BUN 14, creatinine 0.73 AST 16, ALT 17 Total cholesterol 253, triglycerides 166, HDL 59, LDL 161 Hemoglobin A1c 7.0% TSH 4.31 WBC  4.7, hemoglobin 11.5, hematocrit 34.9, platelets 248   Lipid Panel    Component Value Date/Time   CHOL 195 06/01/2017 0804   TRIG 173 (H) 06/01/2017 0804   HDL 55 06/01/2017 0804   CHOLHDL 3.5 06/01/2017 0804   LDLCALC 105 (H) 06/01/2017 0804      Wt Readings from Last 3 Encounters:  09/20/17 168 lb (76.2 kg)  06/14/17 167 lb (75.8 kg)  06/10/17 165 lb 12.8 oz (75.2 kg)      ASSESSMENT AND PLAN:  # Hyperlipidemia: # Family history of CAD: Charlotte Horn's LDL improved significantly on Zetia.  Charlotte Horn initially stopped it but has been tolerating it after restarting.  Charlotte Horn had a coronary calcium score of 0.  Therefore, if Charlotte Horn develops worsening symptoms it would be reasonable to stop it.  # CV Risk prevention: Continue aspirin 81mg  daily.  Continue exercise.   Current medicines are reviewed at length with Charlotte Horn today.  Charlotte Horn does not have concerns regarding medicines.  Charlotte following changes have been made:  None  Labs/ tests ordered today include:   No orders of Charlotte defined types were placed in this encounter.    Disposition:   FU with Keiji Melland C. Duke Salviaandolph, MD, [facility] in 2 years.   This note was written with Charlotte assistance of speech recognition software.  Please excuse any transcriptional errors.  Signed, Yulisa Chirico C. Duke Salviaandolph, MD, [facility]  09/20/2017 8:16 AM    [place]

## 2017-09-20 NOTE — Patient Instructions (Signed)
Medication Instructions:  Your physician recommends that you continue on your current medications as directed. Please refer to the Current Medication list given to you today.  Labwork: none  Testing/Procedures: none  Follow-Up: Your physician wants you to follow-up in: 2 year ov  You will receive a reminder letter in the mail two months in advance. If you don't receive a letter, please call our office to schedule the follow-up appointment.  If you need a refill on your cardiac medications before your next appointment, please call your pharmacy.  

## 2017-09-29 ENCOUNTER — Ambulatory Visit: Payer: 59 | Admitting: Internal Medicine

## 2017-12-02 ENCOUNTER — Encounter: Payer: Self-pay | Admitting: Internal Medicine

## 2017-12-02 ENCOUNTER — Ambulatory Visit: Payer: 59 | Admitting: Internal Medicine

## 2017-12-02 VITALS — BP 105/59 | HR 81 | Temp 97.4°F | Resp 16 | Ht 70.0 in | Wt 170.0 lb

## 2017-12-02 DIAGNOSIS — E1165 Type 2 diabetes mellitus with hyperglycemia: Secondary | ICD-10-CM

## 2017-12-02 DIAGNOSIS — E1142 Type 2 diabetes mellitus with diabetic polyneuropathy: Secondary | ICD-10-CM

## 2017-12-02 LAB — POCT GLYCOSYLATED HEMOGLOBIN (HGB A1C): Hemoglobin A1C: 7.6

## 2017-12-02 MED ORDER — LOSARTAN POTASSIUM 100 MG PO TABS: 100.0000 mg | ORAL_TABLET | Freq: Every day | ORAL | 3 refills | Status: DC

## 2017-12-02 MED ORDER — DULAGLUTIDE 1.5 MG/0.5ML ~~LOC~~ SOAJ: SUBCUTANEOUS | 11 refills | Status: DC

## 2017-12-02 NOTE — Progress Notes (Signed)
Patient ID: Charlotte Horn, female   DOB: Oct 23, 1960, 57 y.o.   MRN: [id number]   HPI: Charlotte Horn is a 57 y.o.-year-old female,  returning for follow-up for DM2, dx in 2004, non-insulin-dependent, uncontrolled, without long-term complications.  Last visit 6 months ago.  She had a URI >> steroids few weeks ago.  Last hemoglobin A1c was: Lab Results  Component Value Date   HGBA1C 7.8 06/10/2017  05/12/2017: HbA1c 9.1% 02/01/2017: HbA1c 7.0%  Pt is on a regimen of: - Metformin ER 1000 mg 2x a day, with meals >> 2000 mg with dinner - Glipizide 5 XL mg with dinner  - moved from bedtime - Trulicity 0.75 mg weekly - started 08/2017 She tried Byetta, Bydureon (1 year) - but had diarrhea. She had very low CBGs with regular Glipizide.  Pt checks her sugars many times a day - on Freestyle Libre CGM 10 days: - am: 139-180, 237 (sweets)  >> 120-180 (more 150-180s), 250 - 2h after b'fast: n/c - before lunch: n/c >> 150s - 2h after lunch: 150-160 >> n/c - before dinner: n/c >> 150s - 2h after dinner: n/c - bedtime: (late dinner): 150-160s >> 150-180 - nighttime: n/c Lowest sugar was 45 (lowest ever) >> 110 >> 70; she has hypoglycemia awareness in the 60s-90s. Highest sugar was 237 (but 385 with prednisone) >> 337.  Glucometer: One Touch Ultra mini  Pt's meals are: - Breakfast: b'fast bar (<15g carbs), almonds, cereal - Lunch: sandwich, veggies - Dinner: meat + veggies + rarely starch - Snacks:  Diet sodas usually lower her sugar.  -No CKD, last BUN/creatinine:  02/01/2017: Glucose 185, BUN/creatinine 14/0.73, GFR 93/107. No results found for: BUN, CREATININE  On losartan.  -+ Hyperlipidemia; last set of lipids: Lab Results  Component Value Date   CHOL 195 06/01/2017   HDL 55 06/01/2017   LDLCALC 105 (H) 06/01/2017   TRIG 173 (H) 06/01/2017   CHOLHDL 3.5 06/01/2017  Previously: 02/01/2017: 253/166/59/161  On ezetimibe (started 04/2017), fish/flax oil, co-Q10.   Managed by cardiology.  - last eye exam was in 07/2017: No DR R. Dr. Audie Box.  -+ Numbness and tingling in her feet.  On alpha lipoic acid, B complex.  On ASA 81  Pt has FH of DM in father, PGM, 2 brothers, nephews  Latest TSH: 02/01/2017: 4.310   ROS: Constitutional: no weight gain/no weight loss, no fatigue, no subjective hyperthermia, no subjective hypothermia Eyes: no blurry vision, no xerophthalmia ENT: no sore throat, no nodules palpated in throat, no dysphagia, no odynophagia, no hoarseness Cardiovascular: no CP/no SOB/no palpitations/no leg swelling Respiratory: no cough/no SOB/no wheezing Gastrointestinal: no N/no V/no D/no C/no acid reflux Musculoskeletal: no muscle aches/no joint aches Skin: no rashes, no hair loss Neurological: no tremors/+ numbness/+ tingling/no dizziness  I reviewed pt's medications, allergies, PMH, social hx, family hx, and changes were documented in the history of present illness. Otherwise, unchanged from my initial visit note.  Past Medical History:  Diagnosis Date  . Family history of early CAD 04/11/2017  . Hyperlipidemia 04/11/2017   PSx H: - multiple eye Sx's: [id number] - C-section 1996 - Gall Bladder Sx 1984  Social History   Social History  . Marital status: Married    Spouse name: N/A  . Number of children: 1   Occupational History  . Reimbursement analyst   Social History Main Topics  . Smoking status: Never Smoker  . Smokeless tobacco: Never Used  . Alcohol use No  . Drug use: No  Current Outpatient Medications on File Prior to Visit  Medication Sig Dispense Refill  . Alpha-Lipoic Acid 100 MG CAPS Take 1 capsule by mouth daily.    Marland Kitchen. aspirin EC 81 MG tablet Take 81 mg by mouth daily.    Marland Kitchen. b complex vitamins capsule Take 1 capsule by mouth daily.    . benzonatate (TESSALON) 200 MG capsule Take 1 capsule by mouth daily as needed.  0  . cholecalciferol (VITAMIN D) 1000 units tablet Take 5,000 Units by mouth daily.    Marland Kitchen.  co-enzyme Q-10 50 MG capsule Take 100 mg by mouth daily.    . Continuous Blood Gluc Receiver (FREESTYLE LIBRE READER) DEVI 1 Device by Does not apply route 3 (three) times daily. 1 Device 1  . Continuous Blood Gluc Sensor (FREESTYLE LIBRE SENSOR SYSTEM) MISC 1 Device by Does not apply route every 30 (thirty) days. 3 each 11  . Cranberry 125 MG TABS Take 1 tablet by mouth daily.    . cyclobenzaprine (FLEXERIL) 10 MG tablet Take 10 mg by mouth 3 (three) times daily as needed for muscle spasms.    . Dulaglutide (TRULICITY) 0.75 MG/0.5ML SOPN Inject 0.75 mg weekly 4 pen 2  . fexofenadine (ALLEGRA) 180 MG tablet Take 1 tablet by mouth daily.    . Flaxseed, Linseed, (FLAXSEED OIL) 1000 MG CAPS Take 1 capsule by mouth daily.    Marland Kitchen. glipiZIDE (GLUCOTROL XL) 5 MG 24 hr tablet Take 1 tablet (5 mg total) by mouth daily. 90 tablet 3  . ipratropium (ATROVENT) 0.06 % nasal spray Place 1 spray into both nostrils.    Marland Kitchen. losartan (COZAAR) 100 MG tablet Take 1 tablet by mouth daily.    . Magnesium Oxide (MAG-200 PO) Take 1 tablet by mouth daily.    . metFORMIN (GLUCOPHAGE-XR) 500 MG 24 hr tablet Take 2 tablets (1,000 mg total) by mouth 2 (two) times daily. 360 tablet 3  . methocarbamol (ROBAXIN) 500 MG tablet Take 500 mg by mouth every 8 (eight) hours as needed for muscle spasms.    . Multiple Vitamin (THERA) TABS Take 1 tablet by mouth daily.    . Omega-3 Fatty Acids (FISH OIL) 1000 MG CAPS Take 2 capsules by mouth daily.     Marland Kitchen. PROAIR HFA 108 (90 Base) MCG/ACT inhaler Inhale 2 puffs into the lungs as needed.  0  . sertraline (ZOLOFT) 100 MG tablet Take 1 tablet by mouth daily.    . traZODone (DESYREL) 100 MG tablet Take 1 tablet by mouth at bedtime.    Marland Kitchen. ezetimibe (ZETIA) 10 MG tablet Take 1 tablet (10 mg total) by mouth daily. 90 tablet 3   No current facility-administered medications on file prior to visit.    Allergies  Allergen Reactions  . Statins Other (See Comments)    MUSCLE ACHES  . Prednisone     Family History  Problem Relation Age of Onset  . Ovarian cancer Mother   . Heart attack Father   . Kidney failure Father   . Sudden Cardiac Death Brother   . Ovarian cancer Maternal Grandmother   . Parkinson's disease Maternal Grandfather   . Heart failure Paternal Grandmother   . Breast cancer Neg Hx     PE: BP (!) 105/59 (BP Location: Left Arm, Patient Position: Sitting, Cuff Size: Small)   Pulse 81   Temp (!) 97.4 F (36.3 C)   Resp 16   Ht 5\' 10"  (1.778 m)   Wt 170 lb (77.1 kg)  SpO2 97%   BMI 24.39 kg/m  Wt Readings from Last 3 Encounters:  12/02/17 170 lb (77.1 kg)  09/20/17 168 lb (76.2 kg)  06/14/17 167 lb (75.8 kg)   Constitutional: overweight, in NAD Eyes: PERRLA, EOMI, no exophthalmos, + B palpebral ptosis ENT: moist mucous membranes, no thyromegaly, no cervical lymphadenopathy Cardiovascular: RRR, No MRG Respiratory: CTA B Gastrointestinal: abdomen soft, NT, ND, BS+ Musculoskeletal: no deformities, strength intact in all 4 Skin: moist, warm, no rashes Neurological: no tremor with outstretched hands, DTR normal in all 4  ASSESSMENT: 1. 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 Patient with long-standing, uncontrolled, diabetes, on oral antidiabetic regimen, with worse control at last visit due to steroids and coming off GLP-1 receptor agonist.  At last visit, we discussed about improving her evening meals, and I also advised her to move all the metformin to dinnertime s to decrease her a.m. sugars.  She was forgetting the glipizide XL frequently, so we discussed about the need to try to be consistent with taking this. Since last visit, as her sugars worsened, we started Trulicity >> she is still on the low dose.  - sugars are almost all above goal >> will need to increase Trulicity to 1.5 mg weekly - I suggested to:  Patient Instructions  Please continue: - Metformin ER 2000 mg with dinner  -  Glipizide XL 5 mg before dinner  Please increase Trulicity to 1.5 mg weekly.  Please return in 3 months with your sugar log.   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

## 2017-12-02 NOTE — Patient Instructions (Addendum)
Please continue: - Metformin ER 2000 mg with dinner  - Glipizide XL 5 mg before dinner  Please increase Trulicity to 1.5 mg weekly.  Please return in 3 months with your sugar log.

## 2018-02-09 ENCOUNTER — Other Ambulatory Visit: Payer: Self-pay | Admitting: *Deleted

## 2018-02-09 MED ORDER — DULAGLUTIDE 1.5 MG/0.5ML ~~LOC~~ SOAJ: SUBCUTANEOUS | 11 refills | Status: DC

## 2018-02-16 ENCOUNTER — Telehealth: Payer: Self-pay | Admitting: Internal Medicine

## 2018-02-16 MED ORDER — FREESTYLE LIBRE 14 DAY READER DEVI: 1.0000 | 0 refills | Status: DC

## 2018-02-16 MED ORDER — FREESTYLE LIBRE 14 DAY SENSOR MISC: 1.0000 | 5 refills | Status: DC

## 2018-02-16 NOTE — Telephone Encounter (Signed)
Dulaglutide (TRULICITY) 1.5 MG/0.5ML SOPN  Patient needs new prescription sent into [facility] stating 90 day supply  Freestyle Libre meter She is currently on 10 day prescription and needs it sent as a 14 day  [id number] - [place], [place] - [address]  Please advise

## 2018-02-16 NOTE — Telephone Encounter (Signed)
Trulicity refill was sent to [facility] on 02/09/18. See meds. New Rxs for [facility] 14 day reader and sensors sent. See meds.

## 2018-02-20 ENCOUNTER — Encounter: Payer: Self-pay | Admitting: Internal Medicine

## 2018-02-20 NOTE — Progress Notes (Signed)
Received labs from PCP from 02/15/2018: HbA1c 7.0%, improved from: Lab Results  Component Value Date   HGBA1C 7.6 12/02/2017  Normal CBC with differential, except slightly high eosinophiles CMP normal with the exception of glucose 162, BUN/creatinine 10/0.66, GFR 99 Vitamin D 74 Lipids: 207/174/58/114 Hep C virus antibody less than 0.1 we will scan the results

## 2018-03-10 ENCOUNTER — Encounter: Payer: Self-pay | Admitting: Internal Medicine

## 2018-03-10 ENCOUNTER — Ambulatory Visit: Payer: Managed Care, Other (non HMO) | Admitting: Internal Medicine

## 2018-03-10 VITALS — BP 100/58 | Ht 69.0 in | Wt 168.7 lb

## 2018-03-10 DIAGNOSIS — E1165 Type 2 diabetes mellitus with hyperglycemia: Secondary | ICD-10-CM

## 2018-03-10 DIAGNOSIS — E7800 Pure hypercholesterolemia, unspecified: Secondary | ICD-10-CM | POA: Diagnosis not present

## 2018-03-10 DIAGNOSIS — E1142 Type 2 diabetes mellitus with diabetic polyneuropathy: Secondary | ICD-10-CM

## 2018-03-10 MED ORDER — GLIPIZIDE 5 MG PO TABS: 5.0000 mg | ORAL_TABLET | Freq: Two times a day (BID) | ORAL | 3 refills | Status: DC

## 2018-03-10 NOTE — Progress Notes (Signed)
Patient ID: Charlotte Horn, female   DOB: [birth year], 57 y.o.   MRN: [id number]   HPI: Charlotte Horn is a 57 y.o.-year-old female,  returning for follow-up for DM2, dx in 2004, non-insulin-dependent, uncontrolled, without long-term complications.  Last visit 3 months ago.  Last hemoglobin A1c was:  02/15/2018: HbA1c 7.0% Lab Results  Component Value Date   HGBA1C 7.6 12/02/2017   HGBA1C 7.8 06/10/2017  05/12/2017: HbA1c 9.1% 02/01/2017: HbA1c 7.0%  Pt is on a regimen of: - Metformin ER 1000 mg 2x a day, with meals >> 2000 mg with dinner - Glipizide 5 XL mg before dinner >> at bedtime (tried different timings >> this worked best) - Trulicity 1.5 mg weekly- started 08/2017 She tried Byetta, Bydureon (1 year) - but had diarrhea. She had very low CBGs with regular Glipizide.  Pt checks her sugars many times a day - on Freestyle Libre CGM 10 days: - am: 139-180, 237 (sweets)  >> 120-180 (more 150-180s), 250 - 2h after b'fast: n/c - before lunch: n/c >> 150s - 2h after lunch: 150-160 >> n/c - before dinner: n/c >> 150s - 2h after dinner: n/c - bedtime: (late dinner): 150-160s >> 150-180 - nighttime: n/c Lowest sugar was 45 (lowest ever) >> 110 >> 70 >> 94; she has hypoglycemia awareness in the 60s-90s. Highest sugar was 337 >> 300.  Freestyle Libre CGM parameters: - average: 143 +/- 38.6  - coefficient of variation: 27% (19-25%) - time in range:  - low (<70): 1% - normal (70-180): 85% - high (>180): 14%  Glucometer: One Touch Ultra mini  Pt's meals are: - Breakfast: b'fast bar (<15g carbs), almonds, cereal - Lunch:  veggies, popcorn - Dinner: meat + veggies + rarely starch - Snacks:  Diet sodas usually lower her sugar.  Received labs from PCP from 02/15/2018: Normal CBC with differential, except slightly high eosinophiles CMP normal with the exception of glucose 162, BUN/creatinine 10/0.66, GFR 99 Vitamin D 74 Lipids: 207/174/58/114 Hep C virus antibody less than  0.1 we will scan the results  -No CKD, last BUN/creatinine:  02/01/2017: Glucose 185, BUN/creatinine 14/0.73, GFR 93/107. No results found for: BUN, CREATININE  On losartan.  -+ HL a; last set of lipids: Lab Results  Component Value Date   CHOL 195 06/01/2017   HDL 55 06/01/2017   LDLCALC 105 (H) 06/01/2017   TRIG 173 (H) 06/01/2017   CHOLHDL 3.5 06/01/2017  Previously: 02/01/2017: 253/166/59/161  She is off ezetimibe due to muscle aches, but continues fish oil, CoQ10.  This is managed by cardiology.  - last eye exam was in 07/2017: No DR . Dr. Audie Box.  - + Numbness and tingling in her feet.  On alpha lipoic acid and B complex  On ASA 81.  Pt has FH of DM in father, PGM, 2 brothers, nephews  Latest TSH: 02/01/2017: Normal, 4.3  ROS: Constitutional: no weight gain/no weight loss, no fatigue, no subjective hyperthermia, no subjective hypothermia Eyes: no blurry vision, no xerophthalmia ENT: no sore throat, no nodules palpated in throat, no dysphagia, no odynophagia, no hoarseness Cardiovascular: no CP/no SOB/no palpitations/no leg swelling Respiratory: no cough/no SOB/no wheezing Gastrointestinal: no N/no V/no D/no C/no acid reflux Musculoskeletal: no muscle aches/no joint aches Skin: no rashes, no hair loss Neurological: no tremors/+ numbness/+ tingling/no dizziness  I reviewed pt's medications, allergies, PMH, social hx, family hx, and changes were documented in the history of present illness. Otherwise, unchanged from my initial visit note.  Past Medical History:  Diagnosis Date  . Family history of early CAD 04/11/2017  . Hyperlipidemia 04/11/2017   PSx H: - multiple eye Sx's: 1610-9604 - C-section 1996 - Gall Bladder Sx 1984  Social History   Social History  . Marital status: Married    Spouse name: N/A  . Number of children: 1   Occupational History  . Reimbursement analyst   Social History Main Topics  . Smoking status: Never Smoker  . Smokeless  tobacco: Never Used  . Alcohol use No  . Drug use: No   Current Outpatient Medications on File Prior to Visit  Medication Sig Dispense Refill  . Alpha-Lipoic Acid 100 MG CAPS Take 1 capsule by mouth daily.    Marland Kitchen aspirin EC 81 MG tablet Take 81 mg by mouth daily.    Marland Kitchen b complex vitamins capsule Take 1 capsule by mouth daily.    . benzonatate (TESSALON) 200 MG capsule Take 1 capsule by mouth daily as needed.  0  . cholecalciferol (VITAMIN D) 1000 units tablet Take 5,000 Units by mouth daily.    Marland Kitchen co-enzyme Q-10 50 MG capsule Take 100 mg by mouth daily.    . Continuous Blood Gluc Receiver (FREESTYLE LIBRE 14 DAY READER) DEVI 1 each by Does not apply route as directed. 1 Device 0  . Continuous Blood Gluc Sensor (FREESTYLE LIBRE 14 DAY SENSOR) MISC 1 each by Does not apply route as directed. 3 each 5  . Cranberry 125 MG TABS Take 1 tablet by mouth daily.    . cyclobenzaprine (FLEXERIL) 10 MG tablet Take 10 mg by mouth 3 (three) times daily as needed for muscle spasms.    . Dulaglutide (TRULICITY) 1.5 MG/0.5ML SOPN Inject 1.5 mg weekly under skin 4 pen 11  . ezetimibe (ZETIA) 10 MG tablet Take 1 tablet (10 mg total) by mouth daily. 90 tablet 3  . fexofenadine (ALLEGRA) 180 MG tablet Take 1 tablet by mouth daily.    . Flaxseed, Linseed, (FLAXSEED OIL) 1000 MG CAPS Take 1 capsule by mouth daily.    Marland Kitchen glipiZIDE (GLUCOTROL XL) 5 MG 24 hr tablet Take 1 tablet (5 mg total) by mouth daily. 90 tablet 3  . ipratropium (ATROVENT) 0.06 % nasal spray Place 1 spray into both nostrils.    Marland Kitchen losartan (COZAAR) 100 MG tablet Take 1 tablet (100 mg total) by mouth daily. 90 tablet 3  . Magnesium Oxide (MAG-200 PO) Take 1 tablet by mouth daily.    . metFORMIN (GLUCOPHAGE-XR) 500 MG 24 hr tablet Take 2 tablets (1,000 mg total) by mouth 2 (two) times daily. 360 tablet 3  . methocarbamol (ROBAXIN) 500 MG tablet Take 500 mg by mouth every 8 (eight) hours as needed for muscle spasms.    . Multiple Vitamin (THERA) TABS  Take 1 tablet by mouth daily.    . Omega-3 Fatty Acids (FISH OIL) 1000 MG CAPS Take 2 capsules by mouth daily.     Marland Kitchen PROAIR HFA 108 (90 Base) MCG/ACT inhaler Inhale 2 puffs into the lungs as needed.  0  . sertraline (ZOLOFT) 100 MG tablet Take 1 tablet by mouth daily.    . traZODone (DESYREL) 100 MG tablet Take 1 tablet by mouth at bedtime.     No current facility-administered medications on file prior to visit.    Allergies  Allergen Reactions  . Statins Other (See Comments)    MUSCLE ACHES  . Prednisone    Family History  Problem Relation Age of Onset  . Ovarian cancer  Mother   . Heart attack Father   . Kidney failure Father   . Sudden Cardiac Death Brother   . Ovarian cancer Maternal Grandmother   . Parkinson's disease Maternal Grandfather   . Heart failure Paternal Grandmother   . Breast cancer Neg Hx     PE: BP (!) 100/58   Ht 5\' 9"  (1.753 m)   Wt 168 lb 11.2 oz (76.5 kg)   BMI 24.91 kg/m  O2 Sat 98% Wt Readings from Last 3 Encounters:  03/10/18 168 lb 11.2 oz (76.5 kg)  12/02/17 170 lb (77.1 kg)  09/20/17 168 lb (76.2 kg)   Constitutional: overweight, in NAD Eyes: PERRLA, EOMI, no exophthalmos, + bilateral palpebral ptosis ENT: moist mucous membranes, no thyromegaly, no cervical lymphadenopathy Cardiovascular: RRR, No MRG Respiratory: CTA B Gastrointestinal: abdomen soft, NT, ND, BS+ Musculoskeletal: no deformities, strength intact in all 4 Skin: moist, warm, no rashes Neurological: no tremor with outstretched hands, DTR normal in all 4  ASSESSMENT: 1. 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 HL  PLAN:  1. Patient with long-standing, uncontrolled, type 2 diabetes, on oral antidiabetic regimen to which we added GLP-1 receptor agonist  last year.  At last visit, she was doing slightly better, but sugars were still above goal so we increased Trulicity to 1.5 mg weekly.  She was also missing doses of glipizide,  so I strongly advised her to not forget this. - We reviewed her latest HbA1c from last month and this has improved, to a target of 7%. -At this visit, reviewing her CGM download, her sugars are increasing after almost every meal especially around dinnertime.  She would like to experiment with glipizide but I advised her to try regular glipizide instead of the extended release.  I will send the prescription to her pharmacy and she will try 2.5 to 5 mg glipizide before a more carb loaded meal.  We will continue with the glipizide XL 5 mg at bedtime for now since she does not have any lows and she feels best on this regimen.  We will also continue the metformin and Trulicity. - I suggested to:  Patient Instructions  Please continue: - Metformin ER 2000 mg with dinner  - Glipizide XL 5 mg at bedtime - Trulicity 1.5 mg weekly   Try to add 2.5-5 mg Glipizide before a carb-loaded meal.  Please return in 3-4 months with your sugar log.   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  She is on alpha lipoic acid and B complex.   - Symptoms are uncontrolled, but she is not taking enough alpha lipoic acid.  I advised her to increase to 600 mg twice a day  3. HL - Reviewed latest lipid panel from 05/2017: LDL close to goal and triglycerides elevated - Continues fish oil, CoQ10 - without side effects. Off Zetia b/cmm aches  Carlus Pavlovristina Trejon Duford, MD PhD [facility]

## 2018-03-10 NOTE — Patient Instructions (Signed)
Please continue: - Metformin ER 2000 mg with dinner  - Glipizide XL 5 mg at bedtime - Trulicity 1.5 mg weekly   Try to add 2.5-5 mg Glipizide before a carb-loaded meal.  Please return in 3-4 months with your sugar log.

## 2018-03-21 ENCOUNTER — Other Ambulatory Visit: Payer: Self-pay | Admitting: Internal Medicine

## 2018-03-21 DIAGNOSIS — R7989 Other specified abnormal findings of blood chemistry: Secondary | ICD-10-CM

## 2018-03-30 ENCOUNTER — Ambulatory Visit
Admission: RE | Admit: 2018-03-30 | Discharge: 2018-03-30 | Disposition: A | Discharge status: Home / Self Care | Payer: Managed Care, Other (non HMO) | Source: Ambulatory Visit | Attending: Internal Medicine | Admitting: Internal Medicine

## 2018-03-30 DIAGNOSIS — R7989 Other specified abnormal findings of blood chemistry: Secondary | ICD-10-CM

## 2018-04-22 ENCOUNTER — Other Ambulatory Visit: Payer: Self-pay | Admitting: Internal Medicine

## 2018-04-29 ENCOUNTER — Other Ambulatory Visit: Payer: Self-pay | Admitting: Internal Medicine

## 2018-05-15 ENCOUNTER — Other Ambulatory Visit: Payer: Self-pay

## 2018-05-15 MED ORDER — GLIPIZIDE 5 MG PO TABS: 5.0000 mg | ORAL_TABLET | Freq: Two times a day (BID) | ORAL | 3 refills | Status: DC

## 2018-07-09 IMAGING — CT CT HEART SCORING
2 series · 16 of 20 positions shown, 18 images · non-contrast
Comparison: Chest radiograph 01/02/2016

CLINICAL DATA: Risk stratification

EXAM:
Coronary Calcium Score
TECHNIQUE: The patient was scanned on a Siemens Somatom 64 slice scanner. Axial
non-contrast 3 mm slices were carried out through the heart. The
data set was analyzed on a dedicated work station and scored using
the Agatson method.

[Series 3: casc 3.0 i36f 2 bestdiast 71 % · axial · 0.32mm/px · z∈[-238,-133]mm · 8 of 46 slices shown, 10 images]
[im 6/46  vessel]
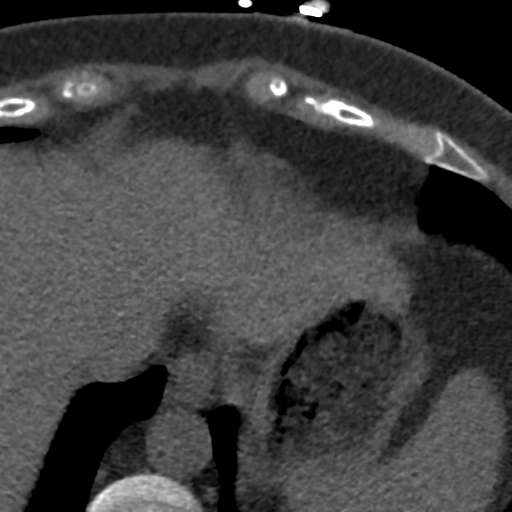
[im 6/46  lung]
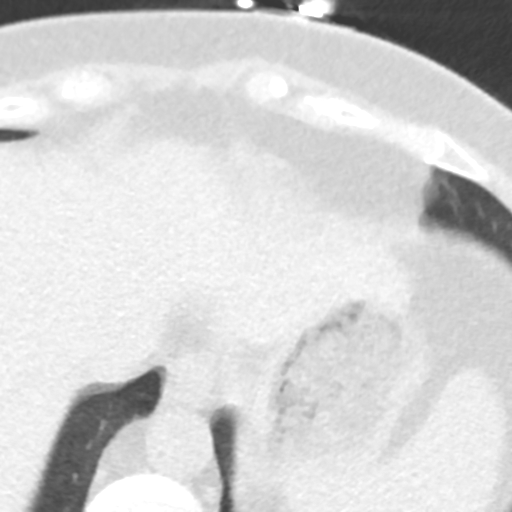
[im 11/46  vessel]
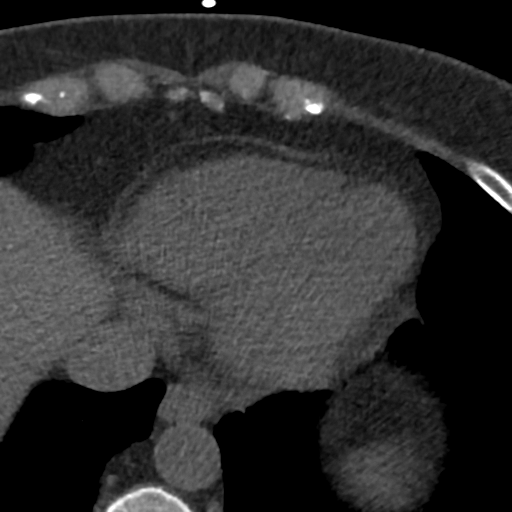
[im 16/46  vessel]
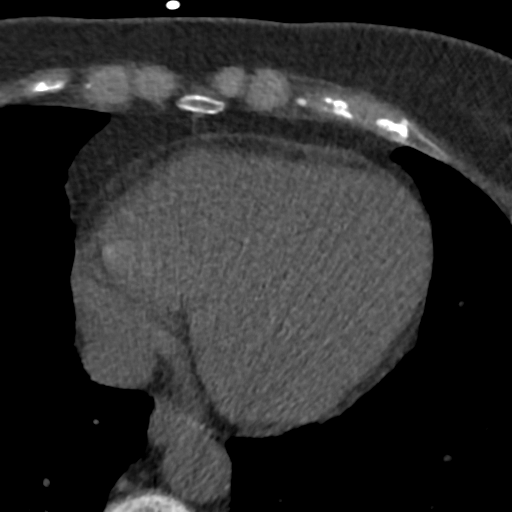
[im 21/46  vessel]
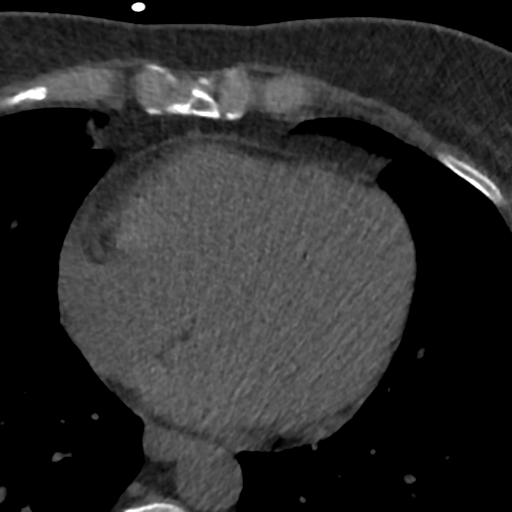
[im 26/46  vessel]
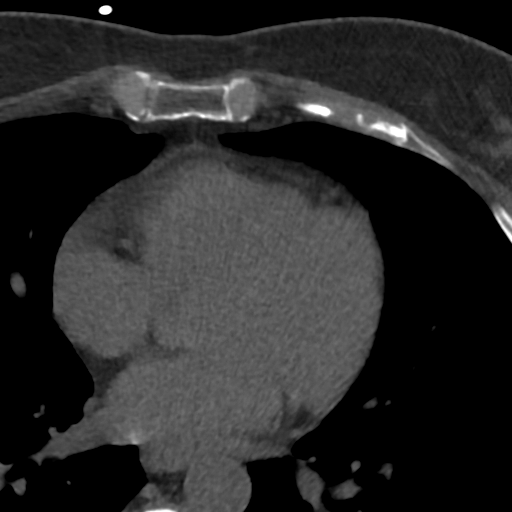
[im 26/46  lung]
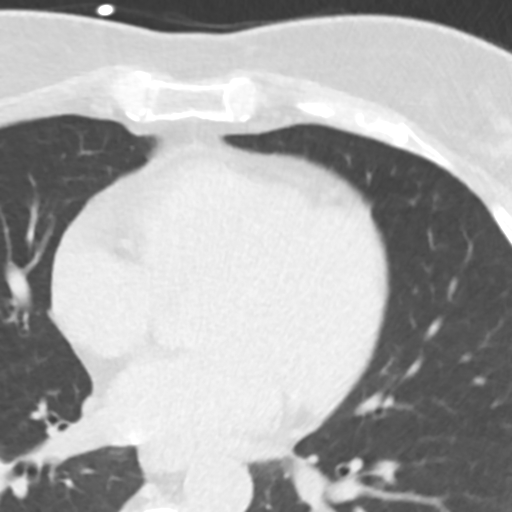
[im 31/46  vessel]
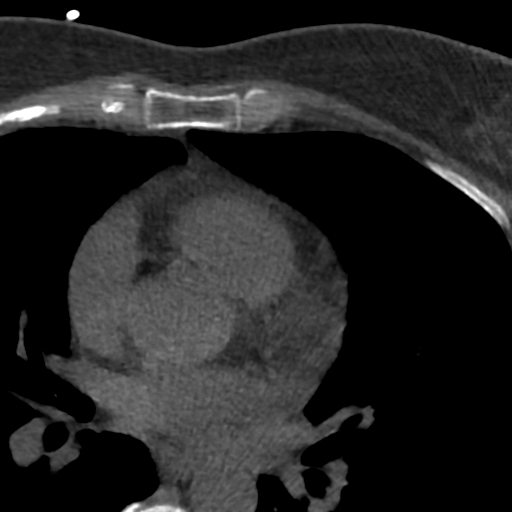
[im 36/46  vessel]
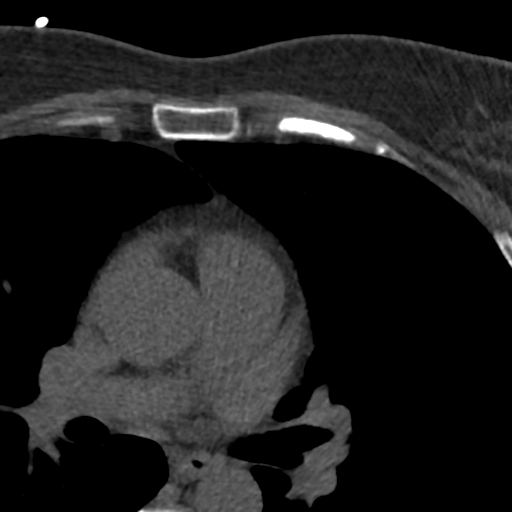
[im 41/46  vessel]
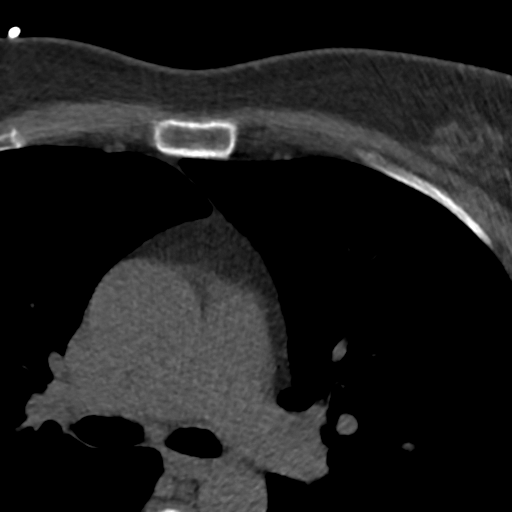

[Series 5: lung st 71 % · axial · 0.61mm/px · z∈[-238,-133]mm · 8 of 46 slices shown]
[im 6/46  lung]
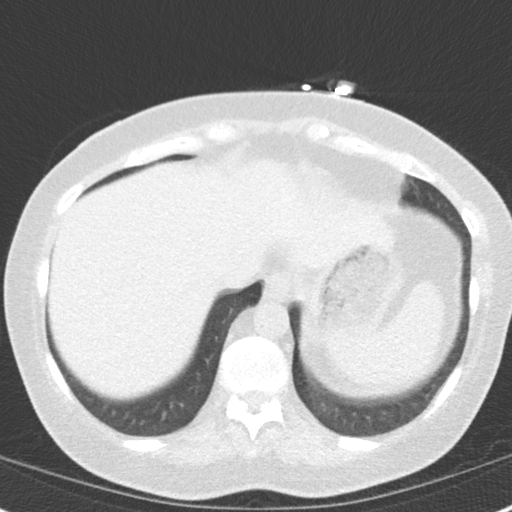
[im 11/46  lung]
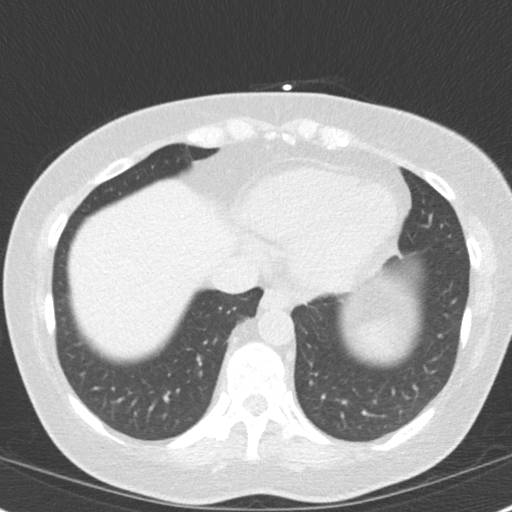
[im 16/46  lung]
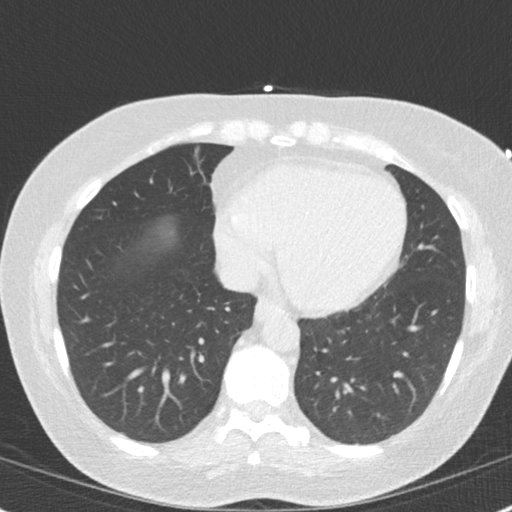
[im 21/46  lung]
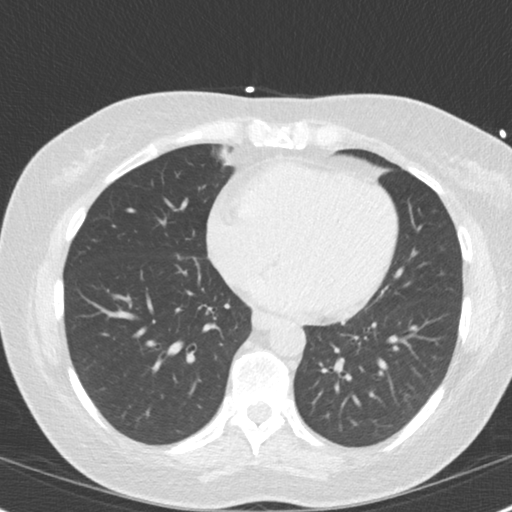
[im 26/46  lung]
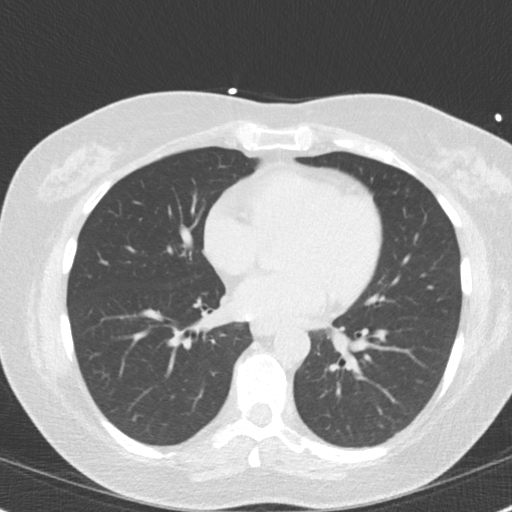
[im 31/46  lung]
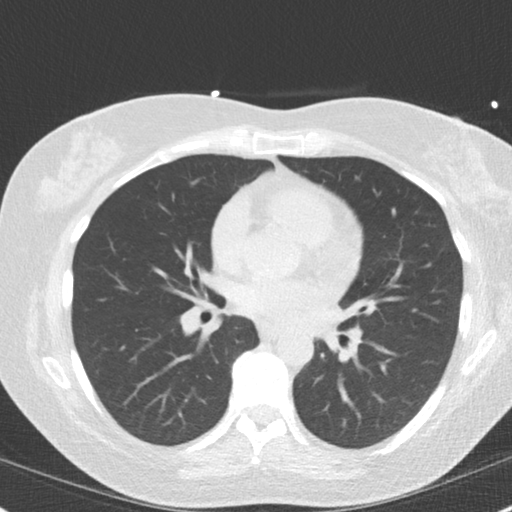
[im 36/46  lung]
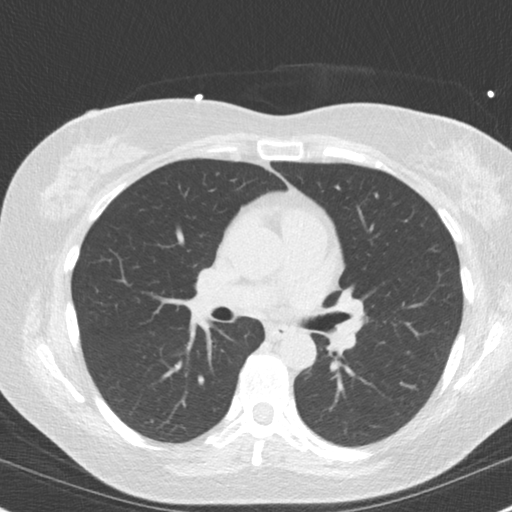
[im 41/46  lung]
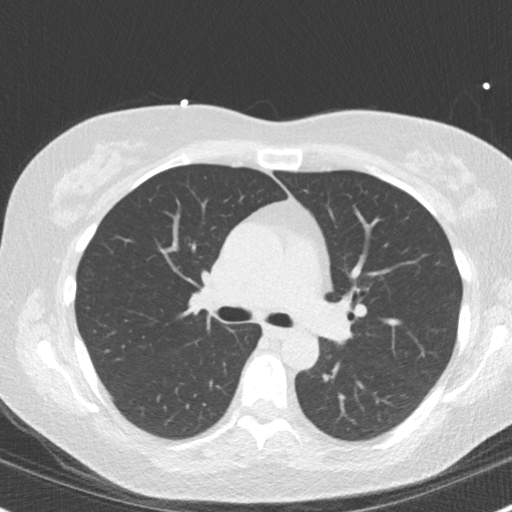

[16 of 20 positions shown; findings below may reference images not displayed]

FINDINGS: Non-cardiac: See separate report from [REDACTED].

Ascending Aorta:  3.7 cm

Pericardium: Normal

Coronary arteries:  No calcium detected
IMPRESSION: Coronary calcium score of 0.

Celedonio Fleck

EXAM:
OVER-READ INTERPRETATION  CT CHEST

The following report is an over-read performed by radiologist Dr.
over-read does not include interpretation of cardiac or coronary
anatomy or pathology. The coronary calcium score coronary calcium
score interpretation by the cardiologist is attached.
FINDINGS: Limited view of the lung parenchyma demonstrates no suspicious
nodularity. Airways are normal.

Limited view of the mediastinum demonstrates no adenopathy.
Esophagus normal.

Limited view of the upper abdomen unremarkable.

Limited view of the skeleton and chest wall is unremarkable.
IMPRESSION: No significant extracardiac findings.

## 2018-07-11 ENCOUNTER — Ambulatory Visit (INDEPENDENT_AMBULATORY_CARE_PROVIDER_SITE_OTHER): Payer: Managed Care, Other (non HMO) | Admitting: Internal Medicine

## 2018-07-11 ENCOUNTER — Encounter: Payer: Self-pay | Admitting: Internal Medicine

## 2018-07-11 VITALS — BP 118/68 | HR 77 | Ht 69.0 in | Wt 161.0 lb

## 2018-07-11 DIAGNOSIS — Z23 Encounter for immunization: Secondary | ICD-10-CM

## 2018-07-11 DIAGNOSIS — E7800 Pure hypercholesterolemia, unspecified: Secondary | ICD-10-CM

## 2018-07-11 DIAGNOSIS — E1165 Type 2 diabetes mellitus with hyperglycemia: Secondary | ICD-10-CM | POA: Diagnosis not present

## 2018-07-11 DIAGNOSIS — E1142 Type 2 diabetes mellitus with diabetic polyneuropathy: Secondary | ICD-10-CM | POA: Diagnosis not present

## 2018-07-11 LAB — POCT GLYCOSYLATED HEMOGLOBIN (HGB A1C): HEMOGLOBIN A1C: 6.1 % — AB (ref 4.0–5.6)

## 2018-07-11 MED ORDER — GLUCOSE BLOOD VI STRP: ORAL_STRIP | 3 refills | Status: AC

## 2018-07-11 NOTE — Patient Instructions (Addendum)
Please continue: - Metformin ER 1000 mg 2x a day with meals. May try to decrease the dose to 500 mg 2x a day. - Glipizide XL 5 mg before b'fast  - Glipizide IR 5 mg glipizide at waking up (only in work days) - Trulicity 1.5 mg weekly   Please return in 4 months with your sugar log.

## 2018-07-11 NOTE — Progress Notes (Signed)
Patient ID: Charlotte Horn, female   DOB: 12/24/1960, 57 y.o.   MRN: [id number]   HPI: Charlotte Horn is a 57 y.o.-year-old female,  returning for follow-up for DM2, dx in 2004, non-insulin-dependent, uncontrolled, without long-term complications.  Last visit 4 months ago.  Last hemoglobin A1c was:  02/15/2018: HbA1c 7.0% Lab Results  Component Value Date   HGBA1C 7.6 12/02/2017   HGBA1C 7.8 06/10/2017  05/12/2017: HbA1c 9.1% 02/01/2017: HbA1c 7.0%  Pt is on a regimen of: - Metformin ER 1000 mg 2x a day, with meals - Glipizide 5 XL mg before dinner >> at bedtime >> before b'fast - Glipizide 5 mg in am when her alarm goes off in am (try different timings and this worked best) - Trulicity 1.5 mg weekly She tried Public house manager, Bydureon (1 year) - but had diarrhea. She had very low CBGs with regular Glipizide.  She checks her sugars many times a day with her freestyle libre CGM: - am: 139-180, 237 (sweets)  >> 120-180 (more 150-180s), 250 >> 65-100 - 2h after b'fast: n/c >>150 - before lunch: n/c >> 150s  >> 100-140 - 2h after lunch: 150-160 >> n/c >> 110-170 - before dinner: n/c >> 150s >> 110-130 - 2h after dinner: n/c >> 120-150 - bedtime: (late dinner): 150-160s >> 150-180 - nighttime: n/c Lowest sugar was 45 (lowest ever) ... 94 >> 65; she has hypoglycemia awareness in the 60s to 90s Highest sugar was 337 >> 300 >> 253.  Sugars highest when her alarm goes off in am.  Freestyle libre CGM parameters: - average: 143 +/- 38.6 >> 117 +/- 34.8 - coefficient of variation: 27% (19-25%) >>  29.7% - time in range:  - low (<70): 1% >> 3% - normal (70-180): 85% >> 92% - high (>180): 14% >> 5%  Glucometer: One Touch Ultra mini  Pt's meals are: - Breakfast: b'fast bar (<15g carbs), almonds, cereal - Lunch:  veggies, popcorn - Dinner: meat + veggies + rarely starch - Snacks:  Diet sodas usually lower her sugar.  Labs from PCP from 02/15/2018: Normal CBC with differential,  except slightly high eosinophiles CMP normal with the exception of glucose 162, BUN/creatinine 10/0.66, GFR 99 Vitamin D 74 Lipids: 207/174/58/114 Hep C virus antibody less than 0.1   -No CKD, last BUN/creatinine:  02/01/2017: Glucose 185, BUN/creatinine 14/0.73, GFR 93/107. No results found for: BUN, CREATININE  On losartan.  -+ HL;lipids: Lab Results  Component Value Date   CHOL 195 06/01/2017   HDL 55 06/01/2017   LDLCALC 105 (H) 06/01/2017   TRIG 173 (H) 06/01/2017   CHOLHDL 3.5 06/01/2017  Previously: 02/01/2017: 253/166/59/161  She is off visit he might be due to muscle aches, but continues on fish oil and co-Q10.  She sees cardiology.  - last eye exam was in 07/2017: No DR.  Dr. Audie Box.  -+ Numbness and tingling in her feet.  On alpha lipoic acid and B complex.  On ASA 81.  Pt has FH of DM in father, PGM, 2 brothers, nephews  Latest TSH: 02/01/2017: Normal, 4.3  ROS: Constitutional: no weight gain/no weight loss, no fatigue, no subjective hyperthermia, no subjective hypothermia Eyes: no blurry vision, no xerophthalmia ENT: no sore throat, no nodules palpated in throat, no dysphagia, no odynophagia, no hoarseness Cardiovascular: no CP/no SOB/no palpitations/no leg swelling Respiratory: no cough/no SOB/no wheezing Gastrointestinal: no N/no V/no D/no C/no acid reflux Musculoskeletal: no muscle aches/no joint aches Skin: no rashes, no hair loss Neurological: no tremors/+ numbness/+  tingling/no dizziness  I reviewed pt's medications, allergies, PMH, social hx, family hx, and changes were documented in the history of present illness. Otherwise, unchanged from my initial visit note.  Past Medical History:  Diagnosis Date  . Family history of early CAD 04/11/2017  . Hyperlipidemia 04/11/2017   PSx H: - multiple eye Sx's: [id number] - C-section 1996 - Gall Bladder Sx 1984  Social History   Social History  . Marital status: Married    Spouse name: N/A  . Number of  children: 1   Occupational History  . Reimbursement analyst   Social History Main Topics  . Smoking status: Never Smoker  . Smokeless tobacco: Never Used  . Alcohol use No  . Drug use: No   Current Outpatient Medications on File Prior to Visit  Medication Sig Dispense Refill  . Alpha-Lipoic Acid 100 MG CAPS Take 1 capsule by mouth daily.    Marland Kitchen aspirin EC 81 MG tablet Take 81 mg by mouth daily.    Marland Kitchen b complex vitamins capsule Take 1 capsule by mouth daily.    . benzonatate (TESSALON) 200 MG capsule Take 1 capsule by mouth daily as needed.  0  . cholecalciferol (VITAMIN D) 1000 units tablet Take 5,000 Units by mouth daily.    Marland Kitchen co-enzyme Q-10 50 MG capsule Take 100 mg by mouth daily.    . Continuous Blood Gluc Receiver (FREESTYLE LIBRE 14 DAY READER) DEVI 1 each by Does not apply route as directed. 1 Device 0  . Continuous Blood Gluc Sensor (FREESTYLE LIBRE 14 DAY SENSOR) MISC 1 each by Does not apply route as directed. 3 each 5  . Cranberry 125 MG TABS Take 1 tablet by mouth daily.    . cyclobenzaprine (FLEXERIL) 10 MG tablet Take 10 mg by mouth 3 (three) times daily as needed for muscle spasms.    . Dulaglutide (TRULICITY) 1.5 MG/0.5ML SOPN Inject 1.5 mg weekly under skin 4 pen 11  . fexofenadine (ALLEGRA) 180 MG tablet Take 1 tablet by mouth daily.    Marland Kitchen glipiZIDE (GLUCOTROL XL) 5 MG 24 hr tablet TAKE 1 TABLET BY MOUTH  DAILY 90 tablet 3  . glipiZIDE (GLUCOTROL) 5 MG tablet Take 1 tablet (5 mg total) by mouth 2 (two) times daily before a meal. 60 tablet 3  . ipratropium (ATROVENT) 0.06 % nasal spray Place 1 spray into both nostrils.    Marland Kitchen losartan (COZAAR) 100 MG tablet Take 1 tablet (100 mg total) by mouth daily. 90 tablet 3  . Magnesium Oxide (MAG-200 PO) Take 1 tablet by mouth daily.    . metFORMIN (GLUCOPHAGE-XR) 500 MG 24 hr tablet TAKE 2 TABLETS BY MOUTH TWO TIMES DAILY 360 tablet 3  . methocarbamol (ROBAXIN) 500 MG tablet Take 500 mg by mouth every 8 (eight) hours as needed for  muscle spasms.    . Multiple Vitamin (THERA) TABS Take 1 tablet by mouth daily.    . Omega-3 Fatty Acids (FISH OIL) 1000 MG CAPS Take 2 capsules by mouth daily.     Marland Kitchen PROAIR HFA 108 (90 Base) MCG/ACT inhaler Inhale 2 puffs into the lungs as needed.  0  . sertraline (ZOLOFT) 100 MG tablet Take 1 tablet by mouth daily.    . traZODone (DESYREL) 100 MG tablet Take 1 tablet by mouth at bedtime.     No current facility-administered medications on file prior to visit.    Allergies  Allergen Reactions  . Statins Other (See Comments)    MUSCLE ACHES  .  Prednisone    Family History  Problem Relation Age of Onset  . Ovarian cancer Mother   . Heart attack Father   . Kidney failure Father   . Sudden Cardiac Death Brother   . Ovarian cancer Maternal Grandmother   . Parkinson's disease Maternal Grandfather   . Heart failure Paternal Grandmother   . Breast cancer Neg Hx     PE: BP 118/68   Pulse 77   Ht 5\' 9"  (1.753 m) Comment: measured  Wt 161 lb (73 kg)   SpO2 99%   BMI 23.78 kg/m   Wt Readings from Last 3 Encounters:  07/11/18 161 lb (73 kg)  03/10/18 168 lb 11.2 oz (76.5 kg)  12/02/17 170 lb (77.1 kg)   Constitutional: overweight, in NAD Eyes: PERRLA, EOMI, no exophthalmos ENT: moist mucous membranes, no thyromegaly, no cervical lymphadenopathy Cardiovascular: RRR, No MRG Respiratory: CTA B Gastrointestinal: abdomen soft, NT, ND, BS+ Musculoskeletal: no deformities, strength intact in all 4 Skin: moist, warm, no rashes Neurological: no tremor with outstretched hands, DTR normal in all 4  ASSESSMENT: 1. 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 HL  PLAN:  1. Patient with long-standing, uncontrolled, type 2 diabetes, on oral antidiabetic regimen, and also GLP-1 receptor agonist.  At last visit, HbA1c was better, at 7.0% (02/20/2018). -At last visit, reviewing her CGM download, sugars were increasing after almost every meal,  especially around dinnertime.  We discussed about taking 2.5 to 5 mg glipizide before a more carb rich meal.  We continued her glipizide XL 5 mg at bedtime since she did not have any lows. -At this visit, she tells me that she moved her glipizide XL before first meal in the morning and she takes 5 mg of glipizide IR when her alarm goes off in the morning since she noticed that her sugars are higher afterwards.  Sugars have better since she started to do so.  During the weekend, she may still take the regular glipizide when she wakes up around 6 AM but may go back to bed for another hour.  I advised her not to take the glipizide and go back to bed. -At this visit, she describes diarrhea, which she had for almost 15 years.  I advised her to try to decrease the metformin dose to half for a few days to see if diarrhea improves.  If it does, she may need to come off metformin to see how she feels.  If no changes with decreasing the metformin dose, Trulicity may be the culprit and we may need to stop this. - I suggested to:  Patient Instructions  Please continue: - Metformin ER 1000 mg 2x a day with meals. May try to decrease the dose to 500 mg 2x a day. - Glipizide XL 5 mg before b'fast  - Glipizide IR 5 mg glipizide at waking up (only in work days) - Trulicity 1.5 mg weekly   Please return in 4 months with your sugar log.   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  She is on alpha lipoic acid and B complex.  At last visit, we increased the alpha lipoic acid to 600 mg twice a day.  She tells me she feels better on this dose.  3. HL - Reviewed latest lipid panel from 02/2018: LDL higher and  triglycerides elevated, stable Lab Results  Component Value Date   CHOL 195 06/01/2017   HDL 55 06/01/2017   LDLCALC 105 (H) 06/01/2017   TRIG 173 (H) 06/01/2017   CHOLHDL 3.5 06/01/2017  - Continues fish oil, co-Q10 without side effects.  Off Zetia because of muscle aches.  Carlus Pavlov, MD PhD [facility]

## 2018-07-25 ENCOUNTER — Other Ambulatory Visit: Payer: Self-pay

## 2018-07-25 ENCOUNTER — Other Ambulatory Visit: Payer: Self-pay | Admitting: Internal Medicine

## 2018-07-25 ENCOUNTER — Telehealth: Payer: Self-pay | Admitting: Internal Medicine

## 2018-07-25 DIAGNOSIS — Z1231 Encounter for screening mammogram for malignant neoplasm of breast: Secondary | ICD-10-CM

## 2018-07-25 MED ORDER — GLIPIZIDE 5 MG PO TABS: 5.0000 mg | ORAL_TABLET | Freq: Two times a day (BID) | ORAL | 3 refills | Status: DC

## 2018-07-25 NOTE — Telephone Encounter (Signed)
RX sent

## 2018-07-25 NOTE — Telephone Encounter (Signed)
Patient requests a 90 day RX for Glipizide 5 mg NON Extended Release sent to [facility] on [address] in [place]

## 2018-08-01 ENCOUNTER — Telehealth: Payer: Self-pay | Admitting: Internal Medicine

## 2018-08-01 ENCOUNTER — Other Ambulatory Visit: Payer: Self-pay

## 2018-08-01 MED ORDER — GLIPIZIDE ER 5 MG PO TB24: 5.0000 mg | ORAL_TABLET | Freq: Every day | ORAL | 3 refills | Status: DC

## 2018-08-01 NOTE — Telephone Encounter (Signed)
I have sent glipizide XL to the patients preferred local pharmacy per patient request

## 2018-08-01 NOTE — Telephone Encounter (Signed)
PLEASE SEE PHONE NOTE:   07/25/18 12:40 PM    [place], Benito Mccreedy contacted Gloris [place] Jacqulynn Cadet       07/25/18 12:40 PM  Note    Patient requests a 90 day RX for Glipizide 5 mg NON Extended Release sent to [facility] on [address] in [place]     This was filled, I have left message for patient to call us back to clarify which one she did indeed want refilled.

## 2018-08-01 NOTE — Telephone Encounter (Signed)
Patient came in to request RX for  glipiZIDE (GLUCOTROL XL) 5 MG 24 hr tablet 90 tablet   With 4 Refills sent to [facility] on [address] in [place] asap. This is patient's 3rd request for a refill. Patient is now out of Glipizide XL.

## 2018-08-09 ENCOUNTER — Other Ambulatory Visit: Payer: Self-pay | Admitting: Internal Medicine

## 2018-08-10 ENCOUNTER — Other Ambulatory Visit: Payer: Self-pay | Admitting: Internal Medicine

## 2018-08-11 ENCOUNTER — Telehealth: Payer: Self-pay | Admitting: Internal Medicine

## 2018-08-11 NOTE — Telephone Encounter (Signed)
Patient is stating she would like her TRULICITY 1.5 MG/0.5ML SOPN written to a 90 day supply refill 4 times. Patient states that's how her insurance will cover it. Also states the prior auth. only last till Nov. 14th and wants to make sure this will get done to last for another year. Please Advice, thanks Ph # [phone] [id number] - [place], [place]

## 2018-08-14 MED ORDER — DULAGLUTIDE 1.5 MG/0.5ML ~~LOC~~ SOAJ: SUBCUTANEOUS | 4 refills | Status: DC

## 2018-08-14 NOTE — Telephone Encounter (Signed)
RX sent

## 2018-08-16 DIAGNOSIS — J343 Hypertrophy of nasal turbinates: Secondary | ICD-10-CM | POA: Insufficient documentation

## 2018-08-16 DIAGNOSIS — J329 Chronic sinusitis, unspecified: Secondary | ICD-10-CM | POA: Insufficient documentation

## 2018-08-16 DIAGNOSIS — J342 Deviated nasal septum: Secondary | ICD-10-CM | POA: Insufficient documentation

## 2018-08-16 DIAGNOSIS — J302 Other seasonal allergic rhinitis: Secondary | ICD-10-CM | POA: Insufficient documentation

## 2018-08-16 HISTORY — DX: Deviated nasal septum: J34.2

## 2018-08-21 ENCOUNTER — Ambulatory Visit
Admission: RE | Admit: 2018-08-21 | Discharge: 2018-08-21 | Disposition: A | Discharge status: Home / Self Care | Payer: Managed Care, Other (non HMO) | Source: Ambulatory Visit | Attending: Internal Medicine | Admitting: Internal Medicine

## 2018-08-21 DIAGNOSIS — Z1231 Encounter for screening mammogram for malignant neoplasm of breast: Secondary | ICD-10-CM

## 2018-08-30 ENCOUNTER — Ambulatory Visit: Payer: Managed Care, Other (non HMO)

## 2018-09-08 ENCOUNTER — Other Ambulatory Visit: Payer: Self-pay | Admitting: Internal Medicine

## 2018-11-01 ENCOUNTER — Other Ambulatory Visit: Payer: Self-pay

## 2018-11-01 MED ORDER — GLIPIZIDE 5 MG PO TABS: 5.0000 mg | ORAL_TABLET | Freq: Two times a day (BID) | ORAL | 3 refills | Status: DC

## 2018-11-14 ENCOUNTER — Ambulatory Visit: Payer: Managed Care, Other (non HMO) | Admitting: Internal Medicine

## 2018-11-14 ENCOUNTER — Encounter: Payer: Self-pay | Admitting: Internal Medicine

## 2018-11-14 VITALS — BP 110/62 | HR 88 | Ht 69.0 in | Wt 160.0 lb

## 2018-11-14 DIAGNOSIS — E1165 Type 2 diabetes mellitus with hyperglycemia: Secondary | ICD-10-CM | POA: Diagnosis not present

## 2018-11-14 DIAGNOSIS — E1142 Type 2 diabetes mellitus with diabetic polyneuropathy: Secondary | ICD-10-CM

## 2018-11-14 DIAGNOSIS — E7800 Pure hypercholesterolemia, unspecified: Secondary | ICD-10-CM

## 2018-11-14 LAB — POCT GLYCOSYLATED HEMOGLOBIN (HGB A1C): Hemoglobin A1C: 6.2 % — AB (ref 4.0–5.6)

## 2018-11-14 NOTE — Progress Notes (Signed)
Patient ID: Charlotte Horn, female   DOB: March 07, 1961, 58 y.o.   MRN: [id number]   HPI: Charlotte Horn is a 58 y.o.-year-old female,  returning for follow-up for DM2, dx in 2004, non-insulin-dependent, uncontrolled, without long-term complications.  Last visit 4 months ago  Last hemoglobin A1c was:  Lab Results  Component Value Date   HGBA1C 6.1 (A) 07/11/2018   HGBA1C 7.6 12/02/2017   HGBA1C 7.8 06/10/2017  02/15/2018: HbA1c 7.0% 05/12/2017: HbA1c 9.1% 02/01/2017: HbA1c 7.0%  Pt is on a regimen of: - Metformin ER 1000  mg 2x a day, with meals  - Glipizide 5 XL mg before dinner >> at bedtime >> before b'fast - Glipizide 5 mg in am when her alarm goes off in am (try different timings and this worked best) - Trulicity 1.5 mg weekly She tried Public house manager, Bydureon (1 year) - but had diarrhea. She had very low CBGs with regular Glipizide.  She checks her sugars >4 times a day with her freestyle libre CGM: - am: 120-180 (more 150-180s), 250 >> 65-100 >> 80-100 - after alarm: 100-150 - 2h after b'fast: n/c >>150 >> 110-140 - before lunch: n/c >> 150s  >> 100-140 >> 100-140 - 2h after lunch: 150-160 >> n/c >> 110-170 >> 110-155 - before dinner: n/c >> 150s >> 110-130 >> 100-140 - 2h after dinner: n/c >> 120-150 >> 120-170 - bedtime: (late dinner): 150-160s >> 150-180 >> 100-125 - nighttime: n/c Lowest sugar was 45 (lowest ever) ... 94 >> 65 >> 75; she has hypoglycemia awareness in the 60s to 90s Highest sugar was 337 >> 300 >> 253 >> 130.  Sugars are highest when her alarm goes off in the morning.  Freestyle libre CGM parameters: - average: 143 +/- 38.6 >> 117 +/- 34.8 >> 116 - coefficient of variation: 27% (19-25%) >>  29.7% >> 28.3% - time in range:  - low (<70): 1% >> 3% >> 2% - normal (70-180): 85% >> 92% >> 94% - high (>180): 14% >> 5% >> 4%  Glucometer: One Touch Ultra mini  Pt's meals are: - Breakfast: b'fast bar (<15g carbs), almonds, cereal - Lunch:   veggies, popcorn - Dinner: meat + veggies + rarely starch - Snacks:  Diet sodas usually lower her sugar.  Labs from PCP from 02/15/2018: Normal CBC with differential, except slightly high eosinophiles CMP normal with the exception of glucose 162, BUN/creatinine 10/0.66, GFR 99 Vitamin D 74 Lipids: 207/174/58/114 Hep C virus antibody less than 0.1   -No CKD: 02/01/2017: Glucose 185, BUN/creatinine 14/0.73, GFR 93/107. No results found for: BUN, CREATININE  On losartan.  -+ HL: Lab Results  Component Value Date   CHOL 195 06/01/2017   HDL 55 06/01/2017   LDLCALC 105 (H) 06/01/2017   TRIG 173 (H) 06/01/2017   CHOLHDL 3.5 06/01/2017  Previously: 02/01/2017: 253/166/59/161  She is off ezetimibe due to muscle aches, but continues fish oil and co-Q10.  - last eye exam was in 07/2018: No DR Dr. Audie Box.  -+ Numbness and tingling in her feet.  Takes B complex and alpha lipoic acid  On ASA 81.  Pt has FH of DM in father, PGM, 2 brothers, nephews  ROS: Constitutional: no weight gain/no weight loss, no fatigue, no subjective hyperthermia, no subjective hypothermia Eyes: no blurry vision, no xerophthalmia ENT: no sore throat, no nodules palpated in neck, no dysphagia, no odynophagia, no hoarseness Cardiovascular: no CP/no SOB/no palpitations/no leg swelling Respiratory: no cough/no SOB/no wheezing Gastrointestinal: no N/no V/no D/no  C/no acid reflux Musculoskeletal: no muscle aches/no joint aches Skin: no rashes, no hair loss Neurological: no tremors/+ numbness/+ tingling/no dizziness  I reviewed pt's medications, allergies, PMH, social hx, family hx, and changes were documented in the history of present illness. Otherwise, unchanged from my initial visit note.  Past Medical History:  Diagnosis Date  . Family history of early CAD 04/11/2017  . Hyperlipidemia 04/11/2017   PSx H: - multiple eye Sx's: [id number] - C-section 1996 - Gall Bladder Sx 1984  Social History   Social  History  . Marital status: Married    Spouse name: N/A  . Number of children: 1   Occupational History  . Reimbursement analyst   Social History Main Topics  . Smoking status: Never Smoker  . Smokeless tobacco: Never Used  . Alcohol use No  . Drug use: No   Current Outpatient Medications on File Prior to Visit  Medication Sig Dispense Refill  . Alpha-Lipoic Acid 100 MG CAPS Take 6 capsules by mouth 2 (two) times daily.    Marland Kitchen. aspirin EC 81 MG tablet Take 81 mg by mouth daily.    Marland Kitchen. b complex vitamins capsule Take 1 capsule by mouth daily.    . benzonatate (TESSALON) 200 MG capsule Take 1 capsule by mouth daily as needed.  0  . cholecalciferol (VITAMIN D) 1000 units tablet Take 5,000 Units by mouth daily.    Marland Kitchen. co-enzyme Q-10 50 MG capsule Take 100 mg by mouth daily.    . Continuous Blood Gluc Receiver (FREESTYLE LIBRE 14 DAY READER) DEVI 1 each by Does not apply route as directed. 1 Device 0  . Continuous Blood Gluc Sensor (FREESTYLE LIBRE 14 DAY SENSOR) MISC 1 each by Does not apply route as directed. 3 each 5  . Cranberry 125 MG TABS Take 1 tablet by mouth daily.    . cyclobenzaprine (FLEXERIL) 10 MG tablet Take 10 mg by mouth 3 (three) times daily as needed for muscle spasms.    . Dulaglutide (TRULICITY) 1.5 MG/0.5ML SOPN INJECT THE CONTENTS OF 1 PEN SUBCUTANEOUSLY WEEKLY AS DIRECTED 12 pen 4  . fexofenadine (ALLEGRA) 180 MG tablet Take 1 tablet by mouth daily.    Marland Kitchen. glipiZIDE (GLUCOTROL XL) 5 MG 24 hr tablet Take 1 tablet (5 mg total) by mouth daily. 90 tablet 3  . glipiZIDE (GLUCOTROL) 5 MG tablet Take 1 tablet (5 mg total) by mouth 2 (two) times daily before a meal. 180 tablet 3  . glucose blood (FREESTYLE TEST STRIPS) test strip Use once a day - for freestyle libre 100 each 3  . ipratropium (ATROVENT) 0.06 % nasal spray Place 1 spray into both nostrils.    Marland Kitchen. losartan (COZAAR) 100 MG tablet Take 1 tablet (100 mg total) by mouth daily. 90 tablet 3  . Magnesium Oxide (MAG-200 PO) Take  1 tablet by mouth daily.    . metFORMIN (GLUCOPHAGE-XR) 500 MG 24 hr tablet TAKE 2 TABLETS BY MOUTH TWO TIMES DAILY 360 tablet 3  . methocarbamol (ROBAXIN) 500 MG tablet Take 500 mg by mouth every 8 (eight) hours as needed for muscle spasms.    . Multiple Vitamin (THERA) TABS Take 1 tablet by mouth daily.    . Omega-3 Fatty Acids (FISH OIL) 1000 MG CAPS Take 2 capsules by mouth daily.     Marland Kitchen. PROAIR HFA 108 (90 Base) MCG/ACT inhaler Inhale 2 puffs into the lungs as needed.  0  . sertraline (ZOLOFT) 100 MG tablet Take 1 tablet by mouth  daily.    . traZODone (DESYREL) 100 MG tablet Take 1 tablet by mouth at bedtime.     No current facility-administered medications on file prior to visit.    Allergies  Allergen Reactions  . Statins Other (See Comments)    MUSCLE ACHES  . Prednisone    Family History  Problem Relation Age of Onset  . Ovarian cancer Mother   . Heart attack Father   . Kidney failure Father   . Sudden Cardiac Death Brother   . Ovarian cancer Maternal Grandmother   . Parkinson's disease Maternal Grandfather   . Heart failure Paternal Grandmother   . Breast cancer Neg Hx     PE: BP 110/62   Pulse 88   Ht 5\' 9"  (1.753 m)   Wt 160 lb (72.6 kg)   SpO2 96%   BMI 23.63 kg/m   Wt Readings from Last 3 Encounters:  11/14/18 160 lb (72.6 kg)  07/11/18 161 lb (73 kg)  03/10/18 168 lb 11.2 oz (76.5 kg)   Constitutional: overweight, in NAD Eyes: PERRLA, EOMI, no exophthalmos ENT: moist mucous membranes, no thyromegaly, no cervical lymphadenopathy Cardiovascular: RRR, No MRG Respiratory: CTA B Gastrointestinal: abdomen soft, NT, ND, BS+ Musculoskeletal: no deformities, strength intact in all 4 Skin: moist, warm, no rashes Neurological: no tremor with outstretched hands, DTR normal in all 4  ASSESSMENT: 1. 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 HL  PLAN:  1. Patient with longstanding, uncontrolled, type 2  diabetes, improved lately, on a regimen of oral antidiabetic regimen and GLP-1 receptor agonist.  Her HbA1c improved significantly, being 6.1% at last visit. -We reviewed her CGM downloads at today's visit (we will scan these) and sugars are again increasing after her alarm sounds in the morning.  She does not have a significant spike in the morning during the weekend when she does not have to go to work.  She is taking glipizide instant release before getting out of bed, after her alarm rings, in an effort to decrease her sugars during this spike.  She also adjusted her breakfast to a small meal to avoid further increase in blood sugars (she needs a small portion of popcorn).  This usually holds her until lunch.  She has a small increase in blood sugars after lunch and a slightly more significant one after dinner, but these are not abnormal.  I do not feel that she needs adjustment of the regimen based on these.  However, we discussed that her a.m. increasing sugars after her alarm rings is usually due to stress.  I did recommend to take her apple cider vinegar tablets when she takes her glipizide instant release, before she gets out of bed, rather than before breakfast.  She is also planning to experiment with metformin at night, by taking 1 tablet with dinner and 1 at bedtime. -We also discussed about her diarrhea, which continues and appears to be related to certain vegetables and certain meals.  She does not feel that her symptoms are related to metformin.  We discussed about the possibility of SIBO.  I advised her to look this up and see if she needs to be tested for it. - I suggested to:  Patient Instructions  Please continue: - Metformin ER 1000 mg 2x a day - Glipizide XL 5 mg before b'fast  - Glipizide IR  mg glipizide at waking up -only in workdays - Trulicity 1.5 mg weekly   Move the  Apple Cider Vinegar when the alarm sounds.  Look up: SIBO.  Please return in 4 months with your sugar log.    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 -Continues alpha-lipoic acid and B complex.  At the previous visit, we increased her alpha-lipoic acid dose to 600 mg twice a day and she feels better on this dose  3. HL - Reviewed  lipid panel from 02/2018: 207/174/58/114 -LDL was higher - Continues fish oil and CoQ10 without side effects. Off statins and Zetia b/c mm aches.  She also had bone pain with statins.  Carlus Pavlovristina Edeline Greening, MD PhD [facility]

## 2018-11-14 NOTE — Patient Instructions (Addendum)
Please continue: - Metformin ER 1000 mg 2x a day - Glipizide XL 5 mg before b'fast  - Glipizide IR  mg glipizide at waking up -only in workdays - Trulicity 1.5 mg weekly   Move the [employer] when the alarm sounds.  Look up: SIBO.  Please return in 4 months with your sugar log.

## 2018-11-15 NOTE — Addendum Note (Signed)
Addended by: Darliss Ridgel I on: 11/15/2018 07:46 AM   Modules accepted: Orders

## 2019-01-22 ENCOUNTER — Other Ambulatory Visit: Payer: Self-pay

## 2019-01-22 ENCOUNTER — Telehealth: Payer: Self-pay | Admitting: Internal Medicine

## 2019-01-22 MED ORDER — DULAGLUTIDE 1.5 MG/0.5ML ~~LOC~~ SOAJ: SUBCUTANEOUS | 4 refills | Status: DC

## 2019-01-22 NOTE — Telephone Encounter (Signed)
MEDICATION:   Dulaglutide (TRULICITY) 1.5 MG/0.5ML SOPN    PHARMACY:  [facility]  IS THIS A 90 DAY SUPPLY : Yes  IS PATIENT OUT OF MEDICATION: Yes  IF NOT; HOW MUCH IS LEFT:   LAST APPOINTMENT DATE: @2 /08/2019  NEXT APPOINTMENT DATE:@6 /08/2019  DO WE HAVE YOUR PERMISSION TO LEAVE A DETAILED MESSAGE:  OTHER COMMENTS:    **Let patient know to contact pharmacy at the end of the day to make sure medication is ready. **  ** Please notify patient to allow 48-72 hours to process**  **Encourage patient to contact the pharmacy for refills or they can request refills through [facility]**

## 2019-01-22 NOTE — Telephone Encounter (Signed)
RX sent

## 2019-03-15 ENCOUNTER — Other Ambulatory Visit: Payer: Self-pay

## 2019-03-15 ENCOUNTER — Ambulatory Visit (INDEPENDENT_AMBULATORY_CARE_PROVIDER_SITE_OTHER): Payer: Managed Care, Other (non HMO) | Admitting: Internal Medicine

## 2019-03-15 DIAGNOSIS — E7800 Pure hypercholesterolemia, unspecified: Secondary | ICD-10-CM

## 2019-03-15 DIAGNOSIS — E1142 Type 2 diabetes mellitus with diabetic polyneuropathy: Secondary | ICD-10-CM | POA: Diagnosis not present

## 2019-03-15 DIAGNOSIS — E1165 Type 2 diabetes mellitus with hyperglycemia: Secondary | ICD-10-CM | POA: Diagnosis not present

## 2019-03-15 NOTE — Progress Notes (Signed)
Patient ID: Charlotte Horn, female   DOB: 1961-08-29, 58 y.o.   MRN: [id number]   Patient location: Home My location: Office  Referring Provider: Kendrick RanchSchoenhoff, Deborah D, MD  I connected with the patient on 03/15/19 at  4:00 PM EDT by a video enabled telemedicine application and verified that I am speaking with the correct person.   I discussed the limitations of evaluation and management by telemedicine and the availability of in person appointments. The patient expressed understanding and agreed to proceed.   Details of the encounter are shown below.  HPI: Charlotte Horn is a 58 y.o.-year-old female,  returning for follow-up for DM2, dx in 2004, non-insulin-dependent, uncontrolled, without long-term complications.  Last visit 4 months ago  Last hemoglobin A1c was:  Lab Results  Component Value Date   HGBA1C 6.2 (A) 11/14/2018   HGBA1C 6.1 (A) 07/11/2018   HGBA1C 7.6 12/02/2017  02/15/2018: HbA1c 7.0% 05/12/2017: HbA1c 9.1% 02/01/2017: HbA1c 7.0%  Pt is on a regimen of: - Metformin ER 1000 mg 2x a day - Glipizide XL 5 mg before b'fast  - Glipizide IR 5 mg glipizide at waking - at waking up in workdays - Trulicity 1.5 mg weekly   She tried Byetta, Bydureon (1 year) - but had diarrhea. She had very low CBGs with regular Glipizide.  She checks her sugars more than 4 times a day with her freestyle libre CGM.   Lowest sugar was 45 (lowest ever) ... 94 >> 65 >> 75 >> 70; she has hypoglycemia awareness in the 60s. Highest sugar was 337 >> 300 >> 253 >> 130 >> 223.  Her sugars are highest when her alarm goes off in the morning.  Freestyle libre CGM parameters: - average: 143 +/- 38.6 >> 117 +/- 34.8 >> 116 >> 135  - coefficient of variation: 27% (19-25%) >>  29.7% >> 28.3% >> 29.5% - time in range:  - low (<70): 1% >> 3% >> 2% >> 1% - normal (70-180): 85% >> 92% >> 94% >> 85% - high (>180): 14% >> 5% >> 4% >> 14 %  Glucometer: One Touch Ultra mini  Pt's  meals are: - Breakfast: b'fast bar (<15g carbs), almonds, cereal - Lunch:  veggies, popcorn - Dinner: meat + veggies + rarely starch - Snacks:  Diet sodas usually lower her sugars.  Labs from PCP from 02/15/2018: Normal CBC with differential, except slightly high eosinophiles CMP normal with the exception of glucose 162, BUN/creatinine 10/0.66, GFR 99 Vitamin D 74 Lipids: 207/174/58/114 Hep C virus antibody less than 0.1   -No CKD: 02/01/2017: Glucose 185, BUN/creatinine 14/0.73, GFR 93. No results found for: BUN, CREATININE  On losartan.  -+ HL: Lab Results  Component Value Date   CHOL 195 06/01/2017   HDL 55 06/01/2017   LDLCALC 105 (H) 06/01/2017   TRIG 173 (H) 06/01/2017   CHOLHDL 3.5 06/01/2017  Previously: 02/01/2017: 253/166/59/161  She is off of ezetimibe and statins due to side effects but continues fish oil and co-Q10.  - last eye exam was in 07/2018: No DR.  Dr. Audie BoxFontaine.  -+ Numbness and tingling in her feet.  Takes B complex and alpha lipoic acid  On ASA 81.  Pt has FH of DM in father, PGM, 2 brothers, nephews  Works from [employer].  ROS: Constitutional: no weight gain/+ weight loss, no fatigue, no subjective hyperthermia, no subjective hypothermia Eyes: no blurry vision, no xerophthalmia ENT: no sore throat, no nodules palpated in neck, no dysphagia, no odynophagia,  no hoarseness Cardiovascular: no CP/no SOB/no palpitations/no leg swelling Respiratory: no cough/no SOB/no wheezing Gastrointestinal: no N/no V/no D/no C/no acid reflux Musculoskeletal: no muscle aches/no joint aches Skin: no rashes, no hair loss Neurological: no tremors/+ numbness/+ tingling/no dizziness  I reviewed pt's medications, allergies, PMH, social hx, family hx, and changes were documented in the history of present illness. Otherwise, unchanged from my initial visit note.  Past Medical History:  Diagnosis Date   Family history of early CAD 04/11/2017   Hyperlipidemia 04/11/2017    PSx H: - multiple eye Sx's: [id number] - C-section 1996 - Gall Bladder Sx 1984  Social History   Social History   Marital status: Married    Spouse name: N/A   Number of children: 1   Occupational History   Management consultant   Social History Main Topics   Smoking status: Never Smoker   Smokeless tobacco: Never Used   Alcohol use No   Drug use: No   Current Outpatient Medications on File Prior to Visit  Medication Sig Dispense Refill   Alpha-Lipoic Acid 100 MG CAPS Take 6 capsules by mouth 2 (two) times daily.     aspirin EC 81 MG tablet Take 81 mg by mouth daily.     b complex vitamins capsule Take 1 capsule by mouth daily.     benzonatate (TESSALON) 200 MG capsule Take 1 capsule by mouth daily as needed.  0   cholecalciferol (VITAMIN D) 1000 units tablet Take 5,000 Units by mouth daily.     co-enzyme Q-10 50 MG capsule Take 100 mg by mouth daily.     Continuous Blood Gluc Receiver (FREESTYLE LIBRE 14 DAY READER) DEVI 1 each by Does not apply route as directed. 1 Device 0   Continuous Blood Gluc Sensor (FREESTYLE LIBRE 14 DAY SENSOR) MISC 1 each by Does not apply route as directed. 3 each 5   Cranberry 125 MG TABS Take 1 tablet by mouth daily.     cyclobenzaprine (FLEXERIL) 10 MG tablet Take 10 mg by mouth 3 (three) times daily as needed for muscle spasms.     Dulaglutide (TRULICITY) 1.5 WN/0.2VO SOPN INJECT THE CONTENTS OF 1 PEN SUBCUTANEOUSLY WEEKLY AS DIRECTED 12 pen 4   fexofenadine (ALLEGRA) 180 MG tablet Take 1 tablet by mouth daily.     glipiZIDE (GLUCOTROL XL) 5 MG 24 hr tablet Take 1 tablet (5 mg total) by mouth daily. 90 tablet 3   glipiZIDE (GLUCOTROL) 5 MG tablet Take 1 tablet (5 mg total) by mouth 2 (two) times daily before a meal. 180 tablet 3   glucose blood (FREESTYLE TEST STRIPS) test strip Use once a day - for freestyle libre 100 each 3   ipratropium (ATROVENT) 0.06 % nasal spray Place 1 spray into both nostrils.     losartan  (COZAAR) 100 MG tablet Take 1 tablet (100 mg total) by mouth daily. 90 tablet 3   Magnesium Oxide (MAG-200 PO) Take 1 tablet by mouth daily.     metFORMIN (GLUCOPHAGE-XR) 500 MG 24 hr tablet TAKE 2 TABLETS BY MOUTH TWO TIMES DAILY 360 tablet 3   methocarbamol (ROBAXIN) 500 MG tablet Take 500 mg by mouth every 8 (eight) hours as needed for muscle spasms.     Multiple Vitamin (THERA) TABS Take 1 tablet by mouth daily.     Omega-3 Fatty Acids (FISH OIL) 1000 MG CAPS Take 2 capsules by mouth daily.      PROAIR HFA 108 (90 Base) MCG/ACT inhaler Inhale 2 puffs  into the lungs as needed.  0   sertraline (ZOLOFT) 100 MG tablet Take 1 tablet by mouth daily.     traZODone (DESYREL) 100 MG tablet Take 1 tablet by mouth at bedtime.     No current facility-administered medications on file prior to visit.    Allergies  Allergen Reactions   Statins Other (See Comments)    MUSCLE ACHES   Prednisone    Family History  Problem Relation Age of Onset   Ovarian cancer Mother    Heart attack Father    Kidney failure Father    Sudden Cardiac Death Brother    Ovarian cancer Maternal Grandmother    Parkinson's disease Maternal Grandfather    Heart failure Paternal Grandmother    Breast cancer Neg Hx     PE: Last week: 155 lbs There were no vitals taken for this visit.  Wt Readings from Last 3 Encounters:  11/14/18 160 lb (72.6 kg)  07/11/18 161 lb (73 kg)  03/10/18 168 lb 11.2 oz (76.5 kg)   Constitutional:  in NAD  The physical exam was not performed (virtual visit).  ASSESSMENT: 1. 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 HL  PLAN:  1. Patient with longstanding, uncontrolled type 2 diabetes, improved lately, on an oral antidiabetic regimen + GLP-1 receptor agonist.  Her HbA1c improved and it was 6.2% at last visit. -She continues to use a CGM -At last visit, her sugars were increasing right after her alarm sounded in the  morning even without eating or drinking anything in that period of time.  She is taking her glipizide instant release immediately after her alarm sounds. -At this visit, sugars are slightly higher most likely due to the stress of the coronavirus pandemic.  Reviewing the CGM downloads (we will scan these), her calculated HbA1c should be 6.5% based on her average of 135.  Reviewing the pattern (please see HPI) it appears that her sugars are excellent overnight they increase in the morning when her alarm sounds and they increase even more after lunch.  For now, I would suggest to try to decrease the amount of carbs and may be the size of her lunch but I do not feel other changes are needed for now. She did notice that a.m. last week they started to improve.  -I did advise her to let me know if the sugars start increasing again, in which case, we can try an SGLT 2 inhibitor - I suggested to:  Patient Instructions  Please  continue: - Metformin ER 1000 mg 2x a day - Glipizide XL 5 mg before b'fast  - Glipizide IR 5 mg glipizide at wakingat waking up in workdays - Trulicity 1.5 mg weekly   Please return in 4 months with your sugar log.   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 HL - Reviewed latest lipid panel from 02/2018: LDL higher Lab Results  Component Value Date   CHOL 195 06/01/2017   HDL 55 06/01/2017   LDLCALC 105 (H) 06/01/2017   TRIG 173 (H) 06/01/2017   CHOLHDL 3.5  06/01/2017  - Continues fish oil without side effects.  She muscle pain from statins and Zetia and also bone pain with statins.  Carlus Pavlovristina Alphonse Asbridge, MD PhD [facility]

## 2019-03-15 NOTE — Patient Instructions (Addendum)
Please  continue: - Metformin ER 1000 mg 2x a day - Glipizide XL 5 mg before b'fast  - Glipizide IR 5 mg glipizide at wakingat waking up in workdays - Trulicity 1.5 mg weekly   Please return in 3-4 months with your sugar log.

## 2019-03-16 ENCOUNTER — Encounter: Payer: Self-pay | Admitting: Internal Medicine

## 2019-04-16 ENCOUNTER — Other Ambulatory Visit: Payer: Self-pay

## 2019-04-16 MED ORDER — LOSARTAN POTASSIUM 100 MG PO TABS: 100.0000 mg | ORAL_TABLET | Freq: Every day | ORAL | 3 refills | Status: DC

## 2019-07-30 ENCOUNTER — Other Ambulatory Visit: Payer: Self-pay

## 2019-07-30 MED ORDER — GLIPIZIDE ER 5 MG PO TB24: 5.0000 mg | ORAL_TABLET | Freq: Every day | ORAL | 3 refills | Status: DC

## 2019-08-02 ENCOUNTER — Other Ambulatory Visit: Payer: Self-pay

## 2019-08-06 ENCOUNTER — Ambulatory Visit (INDEPENDENT_AMBULATORY_CARE_PROVIDER_SITE_OTHER): Payer: Managed Care, Other (non HMO) | Admitting: Internal Medicine

## 2019-08-06 ENCOUNTER — Other Ambulatory Visit: Payer: Self-pay

## 2019-08-06 ENCOUNTER — Encounter: Payer: Self-pay | Admitting: Internal Medicine

## 2019-08-06 VITALS — BP 120/70 | HR 79 | Ht 69.0 in | Wt 165.0 lb

## 2019-08-06 DIAGNOSIS — E1142 Type 2 diabetes mellitus with diabetic polyneuropathy: Secondary | ICD-10-CM | POA: Diagnosis not present

## 2019-08-06 DIAGNOSIS — E1165 Type 2 diabetes mellitus with hyperglycemia: Secondary | ICD-10-CM | POA: Diagnosis not present

## 2019-08-06 DIAGNOSIS — E7800 Pure hypercholesterolemia, unspecified: Secondary | ICD-10-CM

## 2019-08-06 LAB — POCT GLYCOSYLATED HEMOGLOBIN (HGB A1C): Hemoglobin A1C: 6.3 % — AB (ref 4.0–5.6)

## 2019-08-06 MED ORDER — GLIPIZIDE 5 MG PO TABS: 5.0000 mg | ORAL_TABLET | Freq: Two times a day (BID) | ORAL | 3 refills | Status: DC

## 2019-08-06 MED ORDER — METFORMIN HCL ER 500 MG PO TB24: ORAL_TABLET | ORAL | 3 refills | Status: DC

## 2019-08-06 NOTE — Patient Instructions (Addendum)
Please continue: - Metformin ER 1000 mg 2x a day - Glipizide XL 5 mg before b'fast  - Glipizide IR 5 mg glipizide at waking up in workdays - Trulicity 1.5 mg weekly   Please ask Dr. Oval Linsey if SGLT2 inhibitors would be a good option for you.  Please return in 4 months with your sugar log.

## 2019-08-06 NOTE — Addendum Note (Signed)
Addended by: Cardell Peach I on: 08/06/2019 03:36 PM   Modules accepted: Orders

## 2019-08-06 NOTE — Progress Notes (Signed)
Patient ID: Charlotte Horn Micke, female   DOB: [birth year], 58 y.o.   MRN: [id number]   HPI: Charlotte Horn Man is a 58 y.o.-year-old female,  returning for follow-up for DM2, dx in 2004, non-insulin-dependent, uncontrolled, without long-term complications.  Last visit 4 months ago (virtual).  Reviewed HbA1c levels: Lab Results  Component Value Date   HGBA1C 6.2 (A) 11/14/2018   HGBA1C 6.1 (A) 07/11/2018   HGBA1C 7.6 12/02/2017  02/15/2018: HbA1c 7.0% 05/12/2017: HbA1c 9.1% 02/01/2017: HbA1c 7.0%  She is on: - Metformin ER 1000 mg 2x a day - Glipizide XL 5 mg before b'fast  - Glipizide IR 5 mg glipizide at waking up in workdays - Trulicity 1.5 mg weekly  She tried Byetta, Bydureon (1 year) - but had diarrhea. She had very low CBGs with regular Glipizide.  She checks her sugars many times a day (more than 4) with her freestyle libre CGM:   Previously:  Lowest sugar was 45 (lowest ever) ... 70 >> 56; she has hypoglycemia awareness in the 60s. Highest sugar was 337 >>...223 >> 255.  Her sugars are highest when her alarm goes off in the morning.  Freestyle libre CGM parameters: - average: 117 +/- 34.8 >> 116 >> 135 >> 117 - coefficient of variation: 29.7% >> 28.3% >> 29.5% >> 31.8% - time in range:  - very low (<54): 2% - low (54-69): 4% - normal range (70-180): 87% - high sugars (181-250): 7% - very high sugars (>250): 0%  Glucometer: One Touch Ultra mini  Pt's meals are: - Breakfast: b'fast bar (<15g carbs), almonds, cereal - Lunch:  veggies, popcorn - Dinner: meat + veggies + rarely starch - Snacks:  Diet sodas usually lower her sugars.  Labs from PCP from 02/15/2018: Normal CBC with differential, except slightly high eosinophiles CMP normal with the exception of glucose 162, BUN/creatinine 10/0.66, GFR 99 Vitamin D 74 Lipids: 207/174/58/114 Hep C virus antibody less than 0.1   -No CKD: 02/01/2017: Glucose 185, BUN/creatinine 14/0.73, GFR 93. No results  found for: BUN, CREATININE  On losartan.  -+ HL Lab Results  Component Value Date   CHOL 195 06/01/2017   HDL 55 06/01/2017   LDLCALC 105 (H) 06/01/2017   TRIG 173 (H) 06/01/2017   CHOLHDL 3.5 06/01/2017  Previously: 02/01/2017: 253/166/59/161  She continues fish oil and co-Q10 but off ezetimibe and statins due to side effects  - last eye exam was in 07/2018: No DR.  Dr. Audie BoxFontaine.  -She has numbness and tingling in her feet.  On B complex and alpha-lipoic acid  On ASA 81.  Pt has FH of DM in father, PGM, 2 brothers, nephews  Works from [employer].  ROS: Constitutional: no weight gain/no weight loss, no fatigue, no subjective hyperthermia, no subjective hypothermia Eyes: no blurry vision, no xerophthalmia ENT: no sore throat, no nodules palpated in neck, no dysphagia, no odynophagia, no hoarseness Cardiovascular: no CP/no SOB/no palpitations/no leg swelling Respiratory: no cough/no SOB/no wheezing Gastrointestinal: no N/no V/no D/no C/no acid reflux Musculoskeletal: no muscle aches/no joint aches Skin: no rashes, no hair loss Neurological: no tremors/+ numbness/+ tingling/no dizziness  I reviewed pt's medications, allergies, PMH, social hx, family hx, and changes were documented in the history of present illness. Otherwise, unchanged from my initial visit note.  Past Medical History:  Diagnosis Date  . Family history of early CAD 04/11/2017  . Hyperlipidemia 04/11/2017   PSx H: - multiple eye Sx's: [id number] - C-section 1996 - Gall Bladder Sx 1984  Social  History   Social History  . Marital status: Married    Spouse name: N/A  . Number of children: 1   Occupational History  . Reimbursement analyst   Social History Main Topics  . Smoking status: Never Smoker  . Smokeless tobacco: Never Used  . Alcohol use No  . Drug use: No   Current Outpatient Medications on File Prior to Visit  Medication Sig Dispense Refill  . Alpha-Lipoic Acid 100 MG CAPS Take 6 capsules by  mouth 2 (two) times daily.    Marland Kitchen aspirin EC 81 MG tablet Take 81 mg by mouth daily.    Marland Kitchen b complex vitamins capsule Take 1 capsule by mouth daily.    . benzonatate (TESSALON) 200 MG capsule Take 1 capsule by mouth daily as needed.  0  . cholecalciferol (VITAMIN D) 1000 units tablet Take 5,000 Units by mouth daily.    Marland Kitchen co-enzyme Q-10 50 MG capsule Take 100 mg by mouth daily.    . Continuous Blood Gluc Receiver (FREESTYLE LIBRE 14 DAY READER) DEVI 1 each by Does not apply route as directed. 1 Device 0  . Continuous Blood Gluc Sensor (FREESTYLE LIBRE 14 DAY SENSOR) MISC 1 each by Does not apply route as directed. 3 each 5  . Cranberry 125 MG TABS Take 1 tablet by mouth daily.    . cyclobenzaprine (FLEXERIL) 10 MG tablet Take 10 mg by mouth 3 (three) times daily as needed for muscle spasms.    . Dulaglutide (TRULICITY) 1.5 MG/0.5ML SOPN INJECT THE CONTENTS OF 1 PEN SUBCUTANEOUSLY WEEKLY AS DIRECTED 12 pen 4  . fexofenadine (ALLEGRA) 180 MG tablet Take 1 tablet by mouth daily.    Marland Kitchen glipiZIDE (GLUCOTROL XL) 5 MG 24 hr tablet Take 1 tablet (5 mg total) by mouth daily. 90 tablet 3  . glipiZIDE (GLUCOTROL) 5 MG tablet Take 1 tablet (5 mg total) by mouth 2 (two) times daily before a meal. 180 tablet 3  . glucose blood (FREESTYLE TEST STRIPS) test strip Use once a day - for freestyle libre 100 each 3  . ipratropium (ATROVENT) 0.06 % nasal spray Place 1 spray into both nostrils.    Marland Kitchen losartan (COZAAR) 100 MG tablet Take 1 tablet (100 mg total) by mouth daily. 90 tablet 3  . Magnesium Oxide (MAG-200 PO) Take 1 tablet by mouth daily.    . metFORMIN (GLUCOPHAGE-XR) 500 MG 24 hr tablet TAKE 2 TABLETS BY MOUTH TWO TIMES DAILY 360 tablet 3  . methocarbamol (ROBAXIN) 500 MG tablet Take 500 mg by mouth every 8 (eight) hours as needed for muscle spasms.    . Multiple Vitamin (THERA) TABS Take 1 tablet by mouth daily.    . Omega-3 Fatty Acids (FISH OIL) 1000 MG CAPS Take 2 capsules by mouth daily.     Marland Kitchen PROAIR HFA  108 (90 Base) MCG/ACT inhaler Inhale 2 puffs into the lungs as needed.  0  . sertraline (ZOLOFT) 100 MG tablet Take 1 tablet by mouth daily.    . traZODone (DESYREL) 100 MG tablet Take 1 tablet by mouth at bedtime.     No current facility-administered medications on file prior to visit.    Allergies  Allergen Reactions  . Statins Other (See Comments)    MUSCLE ACHES  . Prednisone    Family History  Problem Relation Age of Onset  . Ovarian cancer Mother   . Heart attack Father   . Kidney failure Father   . Sudden Cardiac Death Brother   .  Ovarian cancer Maternal Grandmother   . Parkinson's disease Maternal Grandfather   . Heart failure Paternal Grandmother   . Breast cancer Neg Hx     PE: BP 120/70   Pulse 79   Ht 5\' 9"  (1.753 m)   Wt 165 lb (74.8 kg)   SpO2 99%   BMI 24.37 kg/m   Wt Readings from Last 3 Encounters:  08/06/19 165 lb (74.8 kg)  11/14/18 160 lb (72.6 kg)  07/11/18 161 lb (73 kg)   Constitutional: overweight, in NAD Eyes: PERRLA, EOMI, no exophthalmos ENT: moist mucous membranes, no thyromegaly, no cervical lymphadenopathy Cardiovascular: RRR, No MRG Respiratory: CTA B Gastrointestinal: abdomen soft, NT, ND, BS+ Musculoskeletal: no deformities, strength intact in all 4 Skin: moist, warm, no rashes Neurological: no tremor with outstretched hands, DTR normal in all 4  ASSESSMENT: 1. 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 HL  PLAN:  1. Patient with longstanding, uncontrolled, type 2 diabetes, improved before last visit, on oral antidiabetic regimen with GLP-1 receptor agonist.  Latest HbA1c was better, at 6.2% in 11/2018.  At last visit was virtual so we could not check an HbA1c.   -She continues to use a CGM.  In the past, sugars were increasing right after her alarm sounded in the morning even without eating or drinking anything so we moved her glipizide instant release immediately after her alarm  sounds.  At last visit, sugars were slightly higher most likely due to stress during the coronavirus pandemic.  Her sugars were excellent overnight and they were increasing in the morning when her alarm sounded and were increasing even more after lunch.  We discussed about improving her meals but we did not change her regimen at that time as she noticed that her sugar started to improve in the week prior to our visit.  We did discuss that we may need to try an SGLT2 inhibitor if the sugars remain high in the morning.  I advised her to check with her cardiologist, whom she will see soon if these are permitted in her case.  We can change from sulfonylurea to the SGLT2 inhibitor at next visit. -At this visit, reviewing her CGM download, it appears that her sugars have improved, with an average of 117.  She had higher blood sugars earlier in the summer though as her mother died in 06-Jun-2019.  However, they have now improved significantly. -She is also tells me that she feels that her sugars are increasing in the morning after her hot shower.  She started to take her shower at night. -For now, I will continue her current regimen. - I suggested to:  Patient Instructions  Please continue: - Metformin ER 1000 mg 2x a day - Glipizide XL 5 mg before b'fast  - Glipizide IR 5 mg glipizide at waking up in workdays - Trulicity 1.5 mg weekly   Please ask Dr. Oval Linsey if SGLT2 inhibitors would be a good option for you.  Please return in 4 months with your sugar log.   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 HL -Reviewed latest lipid panel from  02/2018: LDL higher -She is due for another lipid panel -She continues fish oil without side effects.  She had muscle pain and bone pain from statins and also muscle pain from Zetia.  Carlus Pavlov, MD PhD [facility]

## 2019-08-20 LAB — HM DIABETES EYE EXAM

## 2019-08-28 DIAGNOSIS — E1159 Type 2 diabetes mellitus with other circulatory complications: Secondary | ICD-10-CM | POA: Insufficient documentation

## 2019-08-28 DIAGNOSIS — I152 Hypertension secondary to endocrine disorders: Secondary | ICD-10-CM | POA: Insufficient documentation

## 2019-09-07 ENCOUNTER — Encounter: Payer: Self-pay | Admitting: Cardiovascular Disease

## 2019-09-07 ENCOUNTER — Encounter: Payer: Self-pay | Admitting: Internal Medicine

## 2019-09-07 ENCOUNTER — Other Ambulatory Visit: Payer: Self-pay

## 2019-09-07 ENCOUNTER — Ambulatory Visit: Payer: Managed Care, Other (non HMO) | Admitting: Cardiovascular Disease

## 2019-09-07 VITALS — BP 112/69 | HR 75 | Temp 97.0°F | Ht 69.0 in | Wt 164.0 lb

## 2019-09-07 DIAGNOSIS — Z8249 Family history of ischemic heart disease and other diseases of the circulatory system: Secondary | ICD-10-CM | POA: Diagnosis not present

## 2019-09-07 DIAGNOSIS — E7800 Pure hypercholesterolemia, unspecified: Secondary | ICD-10-CM

## 2019-09-07 NOTE — Progress Notes (Signed)
Patient sent me recent labs obtained by PCP on 08/28/2019: Covid 19 IgG negative CMP with slightly elevated glucose at 115, BUN/creatinine 10/0.61, GFR 101 Lipids: 240/159/57/154 ACR <7

## 2019-09-07 NOTE — Patient Instructions (Signed)
Medication Instructions:  Your physician recommends that you continue on your current medications as directed. Please refer to the Current Medication list given to you today.  *If you need a refill on your cardiac medications before your next appointment, please call your pharmacy*  Lab Work: FASTING LP/CMET IN [place]  If you have labs (blood work) drawn today and your tests are completely normal, you will receive your results only by: Marland Kitchen MyChart Message (if you have MyChart) OR . A paper copy in the mail If you have any lab test that is abnormal or we need to change your treatment, we will call you to review the results.  Testing/Procedures: NONE   Follow-Up: At [facility], you and your health needs are our priority.  As part of our continuing mission to provide you with exceptional heart care, we have created designated Provider Care Teams.  These Care Teams include your primary Cardiologist (physician) and Advanced Practice Providers (APPs -  Physician Assistants and Nurse Practitioners) who all work together to provide you with the care you need, when you need it.  Your next appointment:   12 month(s)  You will receive a reminder letter in the mail two months in advance. If you don't receive a letter, please call our office to schedule the follow-up appointment.  The format for your next appointment:   Either In Person or Virtual  Provider:   You may see DR [facility]  or one of the following Advanced Practice Providers on your designated Care Team:    Kerin Ransom, PA-C  [place], [place]  Coletta Memos, [place]  You have been referred to DR HILTY FOR LIPIDS

## 2019-09-07 NOTE — Progress Notes (Signed)
Cardiology Office Note   Date:  09/07/2019   ID:  Charlotte Horn, DOB [birth year], MRN [id number]  PCP:  Kendrick Ranch, MD  Cardiologist:   Chilton Si, MD   No chief complaint on file.     History of Present Illness: Charlotte Horn is a 58 y.o. female with diabetes, hypertension, asthma and hyperlipidemia who presents for follow up.  She was initially seen 04/2017 for an evaluation of CAD risk.  Charlotte Horn and was referred to cardiology due to hyperlipidemia with statin intolerance.  She has tried simvastatin, atorvastatin, rosuvastatin, pravastatin, and pitavastatin and Horn unable to tolerate them due to myalgias.  She was started on Zetia but had muscle aches with this as well.  Her LDL did decrease from 1 61-1 05 on Zetia.  In the past she has also taken niacin and WelChol.  She did not tolerate niacin and WelChol did not affect her cholesterol.  She has has a strong family history of CAD.  Her father had an MI at age 47 and her brother died of cardiac arrest.  Charlotte Horn was referred for a coronary calcium score 06/28/17 that showed a score of 0.  Since her last appointment Charlotte Horn has Horn doing well.  She has Horn trying to work on her diet and she and her husband are limiting fried/fatty foods.  She has also Horn trying to walk for exercise.  She has no exertional chest pain or shortness of breath.  She denies lower extremity edema, orthopnea or PND.   Past Medical History:  Diagnosis Date  . Family history of early CAD 04/11/2017  . Hyperlipidemia 04/11/2017    History reviewed. No pertinent surgical history.   Current Outpatient Medications  Medication Sig Dispense Refill  . Alpha-Lipoic Acid 100 MG CAPS Take 6 capsules by mouth 2 (two) times daily.    Marland Kitchen aspirin EC 81 MG tablet Take 81 mg by mouth daily.    Marland Kitchen b complex vitamins capsule Take 1 capsule by mouth daily.    . benzonatate (TESSALON) 200 MG capsule  Take 1 capsule by mouth daily as needed.  0  . cholecalciferol (VITAMIN D) 1000 units tablet Take 5,000 Units by mouth daily.    Marland Kitchen co-enzyme Q-10 50 MG capsule Take 100 mg by mouth daily.    . Continuous Blood Gluc Receiver (FREESTYLE LIBRE 14 DAY READER) DEVI 1 each by Does not apply route as directed. 1 Device 0  . Continuous Blood Gluc Sensor (FREESTYLE LIBRE 14 DAY SENSOR) MISC 1 each by Does not apply route as directed. 3 each 5  . Cranberry 125 MG TABS Take 1 tablet by mouth daily.    . cyclobenzaprine (FLEXERIL) 10 MG tablet Take 10 mg by mouth 3 (three) times daily as needed for muscle spasms.    . Dulaglutide (TRULICITY) 1.5 MG/0.5ML SOPN INJECT THE CONTENTS OF 1 PEN SUBCUTANEOUSLY WEEKLY AS DIRECTED 12 pen 4  . fexofenadine (ALLEGRA) 180 MG tablet Take 1 tablet by mouth daily.    Marland Kitchen glipiZIDE (GLUCOTROL XL) 5 MG 24 hr tablet Take 1 tablet (5 mg total) by mouth daily. 90 tablet 3  . glipiZIDE (GLUCOTROL) 5 MG tablet Take 1 tablet (5 mg total) by mouth 2 (two) times daily before a meal. 180 tablet 3  . glucose blood (FREESTYLE TEST STRIPS) test strip Use once a day - for freestyle libre 100 each 3  . ipratropium (ATROVENT) 0.06 % nasal  spray Place 1 spray into both nostrils.    Marland Kitchen. losartan (COZAAR) 100 MG tablet Take 1 tablet (100 mg total) by mouth daily. 90 tablet 3  . Magnesium Oxide (MAG-200 PO) Take 1 tablet by mouth daily.    . metFORMIN (GLUCOPHAGE-XR) 500 MG 24 hr tablet TAKE 2 TABLETS BY MOUTH TWO TIMES DAILY 360 tablet 3  . methocarbamol (ROBAXIN) 500 MG tablet Take 500 mg by mouth every 8 (eight) hours as needed for muscle spasms.    . Multiple Vitamin (THERA) TABS Take 1 tablet by mouth daily.    . Omega-3 Fatty Acids (FISH OIL) 1000 MG CAPS Take 2 capsules by mouth daily.     Marland Kitchen. PROAIR HFA 108 (90 Base) MCG/ACT inhaler Inhale 2 puffs into the lungs as needed.  0  . sertraline (ZOLOFT) 100 MG tablet Take 1 tablet by mouth daily.    . traZODone (DESYREL) 100 MG tablet Take 1  tablet by mouth at bedtime.     No current facility-administered medications for this visit.     Allergies:   Statins and Prednisone    Social History:  The patient  reports that she has never smoked. She has never used smokeless tobacco.   Family History:  The patient's family history includes Heart attack in her father; Heart failure in her paternal grandmother; Kidney failure in her father; Ovarian cancer in her maternal grandmother and mother; Parkinson's disease in her maternal grandfather; Sudden Cardiac Death in her brother.    ROS:  Please see the history of present illness.   Otherwise, review of systems are positive for none.   All other systems are reviewed and negative.    PHYSICAL EXAM: VS:  BP 112/69   Pulse 75   Temp (!) 97 F (36.1 C)   Ht 5\' 9"  (1.753 m)   Wt 164 lb (74.4 kg)   SpO2 99%   BMI 24.22 kg/m  , BMI Body mass index is 24.22 kg/m. GENERAL:  Well appearing HEENT: Pupils equal round and reactive, fundi not visualized, oral mucosa unremarkable NECK:  No jugular venous distention, waveform within normal limits, carotid upstroke brisk and symmetric, no bruits LUNGS:  Clear to auscultation bilaterally HEART:  RRR.  PMI not displaced or sustained,S1 and S2 within normal limits, no S3, no S4, no clicks, no rubs, no murmurs ABD:  Flat, positive bowel sounds normal in frequency in pitch, no bruits, no rebound, no guarding, no midline pulsatile mass, no hepatomegaly, no splenomegaly EXT:  2 plus pulses throughout, no edema, no cyanosis no clubbing SKIN:  No rashes no nodules NEURO:  Cranial nerves II through XII grossly intact, motor grossly intact throughout PSYCH:  Cognitively intact, oriented to person place and time   EKG:  EKG is ordered today. The ekg ordered 04/11/17 demonstrates sinus rhythm.  Rate 67 bpm.  First degree AV block 09/07/19: Sinus rhythm.  Rate 75 bpm.  First degree AV block.    Recent Labs: No results found for requested labs within  last 8760 hours.   02/01/17:  Sodium 141, potassium 4.8, BUN 14, creatinine 0.73 AST 16, ALT 17 Total cholesterol 253, triglycerides 166, HDL 59, LDL 161 Hemoglobin A1c 7.0% TSH 4.31 WBC 4.7, hemoglobin 11.5, hematocrit 34.9, platelets 248   Lipid Panel    Component Value Date/Time   CHOL 195 06/01/2017 0804   TRIG 173 (H) 06/01/2017 0804   HDL 55 06/01/2017 0804   CHOLHDL 3.5 06/01/2017 0804   LDLCALC 105 (H) 06/01/2017 11910804  Wt Readings from Last 3 Encounters:  09/07/19 164 lb (74.4 kg)  08/06/19 165 lb (74.8 kg)  11/14/18 160 lb (72.6 kg)      ASSESSMENT AND PLAN:  # Hyperlipidemia: # Family history of CAD: Charlotte Horn LDL improved significantly on Zetia.  However she did not tolerate it due to myalgias.  She has tried several statins and niacin.  Welchol was not effective.  She is working on diet and exercise.  We will refer her to Dr. Debara Pickett to consider other options.  I doubt that bempidoic acid or PCSK9 inhibitor will be covered by insurance.  She was given information regarding dietary changes.  # CV Risk prevention: Continue aspirin 81mg  daily.  Continue exercise.   Current medicines are reviewed at length with the patient today.  The patient does not have concerns regarding medicines.  The following changes have Horn made:  None  Labs/ tests ordered today include:   Orders Placed This Encounter  Procedures  . Lipid Profile  . Comprehensive Metabolic Panel (CMET)  . EKG 12-Lead     Disposition:   FU with Charlotte Frary C. Oval Linsey, MD, [facility] in 1 year.   This note was written with the assistance of speech recognition software.  Please excuse any transcriptional errors.  Signed, Morell Mears C. Oval Linsey, MD, [facility]  09/07/2019 4:42 PM    [place]

## 2019-09-18 ENCOUNTER — Other Ambulatory Visit: Payer: Self-pay | Admitting: Internal Medicine

## 2019-10-29 ENCOUNTER — Other Ambulatory Visit: Payer: Self-pay | Admitting: Internal Medicine

## 2019-10-29 ENCOUNTER — Encounter: Payer: Self-pay | Admitting: Internal Medicine

## 2019-10-29 ENCOUNTER — Other Ambulatory Visit: Payer: Self-pay

## 2019-10-29 ENCOUNTER — Ambulatory Visit: Payer: Managed Care, Other (non HMO) | Admitting: Internal Medicine

## 2019-10-29 VITALS — BP 109/64 | HR 76 | Ht 69.0 in | Wt 165.0 lb

## 2019-10-29 DIAGNOSIS — E119 Type 2 diabetes mellitus without complications: Secondary | ICD-10-CM

## 2019-10-29 DIAGNOSIS — E785 Hyperlipidemia, unspecified: Secondary | ICD-10-CM

## 2019-10-29 DIAGNOSIS — Z8249 Family history of ischemic heart disease and other diseases of the circulatory system: Secondary | ICD-10-CM | POA: Diagnosis not present

## 2019-10-29 DIAGNOSIS — M791 Myalgia, unspecified site: Secondary | ICD-10-CM

## 2019-10-29 DIAGNOSIS — T466X5A Adverse effect of antihyperlipidemic and antiarteriosclerotic drugs, initial encounter: Secondary | ICD-10-CM

## 2019-10-29 LAB — LIPID PANEL
Chol/HDL Ratio: 3.4 ratio (ref 0.0–4.4)
Cholesterol, Total: 205 mg/dL — ABNORMAL HIGH (ref 100–199)
HDL: 60 mg/dL (ref >39)
LDL Chol Calc (NIH): 119 mg/dL — ABNORMAL HIGH (ref 0–99)
Triglycerides: 151 mg/dL — ABNORMAL HIGH (ref 0–149)
VLDL Cholesterol Cal: 26 mg/dL (ref 5–40)

## 2019-10-29 NOTE — Progress Notes (Signed)
LIPID CLINIC CONSULT NOTE  Chief Complaint:  Manage dyslipidemia  Primary Care Physician: Kendrick Ranch, MD  Primary Cardiologist:  No primary care provider on file.  HPI:  Charlotte Horn is a 59 y.o. female who is being seen today for the evaluation of dyslipidemia at the request of Dr. Duke Salvia.  This is a pleasant 59 year old female kindly referred by Dr. Duke Salvia for evaluation management of dyslipidemia.  Past medical history significant for early onset heart disease in her brother and father as well as dyslipidemia.  She also has type 2 diabetes managed on oral medications with an A1c of 6.3.  She is on losartan however she said this is for renal protection and denies a history of hypertension although the chart does have this listed as a diagnosis.  Blood pressure is low normal today.  Unfortunately, she has a history of statin intolerance.  She has tried numerous statins including simvastatin, atorvastatin, rosuvastatin, pravastatin and Livalo, as well as WelChol, niacin and ezetimibe, all which cause myalgias.  She had coronary calcium scoring in September 2018 which was 0 and is reassuring.  She has been trying to optimize her diet but felt that she could benefit from some more dietary information given her diabetes as well as the need to lower cholesterol.  Her most recent lipids were in February 2020 which showed total cholesterol 230, HDL 47, LDL 147 triglycerides 181.  PMHx:  Past Medical History:  Diagnosis Date  . Family history of early CAD 04/11/2017  . Hyperlipidemia 04/11/2017    No past surgical history on file.  FAMHx:  Family History  Problem Relation Age of Onset  . Ovarian cancer Mother   . Heart attack Father   . Kidney failure Father   . Sudden Cardiac Death Brother   . Ovarian cancer Maternal Grandmother   . Parkinson's disease Maternal Grandfather   . Heart failure Paternal Grandmother   . Breast cancer Neg Hx     SOCHx:   reports  that she has never smoked. She has never used smokeless tobacco. No history on file for alcohol and drug.  ALLERGIES:  Allergies  Allergen Reactions  . Statins Other (See Comments)    MUSCLE ACHES  . Prednisone     ROS: Pertinent items noted in HPI and remainder of comprehensive ROS otherwise negative.  HOME MEDS: Current Outpatient Medications on File Prior to Visit  Medication Sig Dispense Refill  . Alpha-Lipoic Acid 100 MG CAPS Take 6 capsules by mouth 2 (two) times daily.    Marland Kitchen aspirin EC 81 MG tablet Take 81 mg by mouth daily.    Marland Kitchen b complex vitamins capsule Take 1 capsule by mouth daily.    . benzonatate (TESSALON) 200 MG capsule Take 1 capsule by mouth daily as needed.  0  . cholecalciferol (VITAMIN D) 1000 units tablet Take 5,000 Units by mouth daily.    Marland Kitchen co-enzyme Q-10 50 MG capsule Take 100 mg by mouth daily.    . Continuous Blood Gluc Receiver (FREESTYLE LIBRE 14 DAY READER) DEVI 1 each by Does not apply route as directed. 1 Device 0  . Continuous Blood Gluc Sensor (FREESTYLE LIBRE 14 DAY SENSOR) MISC USE DEVICE AS DIRECTED 2 each 17  . Cranberry 125 MG TABS Take 1 tablet by mouth daily.    . cyclobenzaprine (FLEXERIL) 10 MG tablet Take 10 mg by mouth 3 (three) times daily as needed for muscle spasms.    . Dulaglutide (TRULICITY) 1.5 MG/0.5ML  SOPN INJECT THE CONTENTS OF 1 PEN SUBCUTANEOUSLY WEEKLY AS DIRECTED 12 pen 4  . fexofenadine (ALLEGRA) 180 MG tablet Take 1 tablet by mouth daily.    Marland Kitchen glipiZIDE (GLUCOTROL XL) 5 MG 24 hr tablet Take 1 tablet (5 mg total) by mouth daily. 90 tablet 3  . glipiZIDE (GLUCOTROL) 5 MG tablet Take 1 tablet (5 mg total) by mouth 2 (two) times daily before a meal. 180 tablet 3  . glucose blood (FREESTYLE TEST STRIPS) test strip Use once a day - for freestyle libre 100 each 3  . ipratropium (ATROVENT) 0.06 % nasal spray Place 1 spray into both nostrils.    Marland Kitchen losartan (COZAAR) 100 MG tablet Take 1 tablet (100 mg total) by mouth daily. 90 tablet 3   . Magnesium Oxide (MAG-200 PO) Take 1 tablet by mouth daily.    . metFORMIN (GLUCOPHAGE-XR) 500 MG 24 hr tablet TAKE 2 TABLETS BY MOUTH TWO TIMES DAILY 360 tablet 3  . methocarbamol (ROBAXIN) 500 MG tablet Take 500 mg by mouth every 8 (eight) hours as needed for muscle spasms.    . Multiple Vitamin (THERA) TABS Take 1 tablet by mouth daily.    . Omega-3 Fatty Acids (FISH OIL) 1000 MG CAPS Take 2 capsules by mouth daily.     Marland Kitchen PROAIR HFA 108 (90 Base) MCG/ACT inhaler Inhale 2 puffs into the lungs as needed.  0  . sertraline (ZOLOFT) 100 MG tablet Take 1 tablet by mouth daily.    . traZODone (DESYREL) 100 MG tablet Take 1 tablet by mouth at bedtime.     No current facility-administered medications on file prior to visit.    LABS/IMAGING: No results found for this or any previous visit (from the past 48 hour(s)). No results found.  LIPID PANEL:    Component Value Date/Time   CHOL 195 06/01/2017 0804   TRIG 173 (H) 06/01/2017 0804   HDL 55 06/01/2017 0804   CHOLHDL 3.5 06/01/2017 0804   LDLCALC 105 (H) 06/01/2017 0804    WEIGHTS: Wt Readings from Last 3 Encounters:  10/29/19 165 lb (74.8 kg)  09/07/19 164 lb (74.4 kg)  08/06/19 165 lb (74.8 kg)    VITALS: BP 109/64   Pulse 76   Ht 5\' 9"  (1.753 m)   Wt 165 lb (74.8 kg)   SpO2 100%   BMI 24.37 kg/m   EXAM: General appearance: alert and no distress Lungs: clear to auscultation bilaterally Heart: regular rate and rhythm Extremities: extremities normal, atraumatic, no cyanosis or edema Neurologic: Grossly normal Psych: Pleasant  EKG: Deferred  ASSESSMENT: 1. Mixed dyslipidemia, goal LDL less than 100 2. Statin intolerance-myalgias 3. Strong family history of premature coronary disease 4. Type 2 diabetes, not on insulin-A1c 6.3 5. 0 CAC score (2018)  PLAN: 1.   Charlotte Horn has a mixed dyslipidemia with a target LDL less than 100 based on diabetes which is low risk as well as family history of early onset heart  disease.  Fortunately she had a 0 calcium score.  She has been intolerant of all statins, ezetimibe, niacin and WelChol, all causing myalgias.  We discussed several therapeutic options including bempedoic acid for which she may qualify, PCSK9 inhibitors which I doubt she would qualify or Vascepa, given elevated triglycerides and diabetes with 2 risk factors.  There is limited data for Vascepa monotherapy without statin and LDL above target therefore I would focus on LDL lowering first.  After discussing the options we also discussed dietary management.  Although I think it is unlikely for her to reach a target LDL less than 100, her labs were about a year ago therefore we will repeat them today, refer to a dietitian per her request and try to optimize her numbers.  If she is not able to reach target in 6 months, then would consider prior authorization for bempedoic acid.  Thanks again for the kind referral.  Pixie Casino, MD, FACC, [place] Director of the [facility]  [facility] Diplomate of the American Board of Clinical Lipidology Attending Cardiologist  Direct Dial: [phone]  Fax: [phone]  Website:  [email or web address] 10/29/2019, 8:56 AM

## 2019-10-29 NOTE — Patient Instructions (Signed)
Medication Instructions:  Your physician recommends that you continue on your current medications as directed. Please refer to the Current Medication list given to you today.  Nexletol   *If you need a refill on your cardiac medications before your next appointment, please call your pharmacy*  Lab Work: FASTING lab work today   If you have labs (blood work) drawn today and your tests are completely normal, you will receive your results only by: Marland Kitchen MyChart Message (if you have MyChart) OR . A paper copy in the mail If you have any lab test that is abnormal or we need to change your treatment, we will call you to review the results.  Follow-Up: At [facility], you and your health needs are our priority.  As part of our continuing mission to provide you with exceptional heart care, we have created designated Provider Care Teams.  These Care Teams include your primary Cardiologist (physician) and Advanced Practice Providers (APPs -  Physician Assistants and Nurse Practitioners) who all work together to provide you with the care you need, when you need it.  Your next appointment:   6 month(s) - LIPID CLINIC  The format for your next appointment:   Either In Person or Virtual  Provider:   Kirtland Bouchard [place] Hilty, MD  Other Instructions  You have been referred to [facility] Nutrition Services

## 2019-11-07 ENCOUNTER — Other Ambulatory Visit: Payer: Self-pay

## 2019-11-07 ENCOUNTER — Encounter: Payer: Managed Care, Other (non HMO) | Attending: Internal Medicine | Admitting: Nutrition

## 2019-11-07 DIAGNOSIS — E785 Hyperlipidemia, unspecified: Secondary | ICD-10-CM | POA: Diagnosis present

## 2019-11-07 DIAGNOSIS — E1142 Type 2 diabetes mellitus with diabetic polyneuropathy: Secondary | ICD-10-CM

## 2019-11-09 NOTE — Patient Instructions (Signed)
Do not eat cookie at HS, and see if blood sugar drops below 70. Call if this happens

## 2019-11-09 NOTE — Progress Notes (Signed)
Paitent is here because she does not want to go on additional lipid lowering medications.  She can not take other medications for this due to muscle pain.  She was told that diet control can low levels only about 10%, and that better blood sugar control can have about another 10-25% lowering effect.  She reports that her readings were high, but that for the last several weeks, the average is about 120-130.  She is wearing a Libre sensor with this data available to her. Typical day: 6:45: up 7:15 work.  Drinks water all day long -32oz                           q4 hours 8AM:  Handful of nuts (1/4-1/3 cup)  10:30-11: Bfast:  1 piece of toast (whole wheat)  With pumpkin spread, or sugar free ganola, with almond milk and berries, or small bag of Skinny POP popcorn 1-2PM: lunch:  ususally left overs from supper:  Protein (3 ounces), with veg. (1 starch), or 1/2 sandwich if [place], or chicken slices- 2-3 ounces) witer to dirnk, or 1 baby bell cheese with 3-4 crackers 3:30-4 Snack: small apple or broccoli iwht light ranch dsg. Or cheese=1 ounce. 5-6PM: Supper: 4-5 ounces protein (usually skinless boneless chicken), or pork shop or very rarely red meat. 1-2 non starchy veg., and one starch, either bread or veg. (1/2cup corn, or potato).   8PM: small bag of skinny pop popcorn, or cookie she makes with oats, bran and dark chocolate.  This cookie appears to run her blood sugar up to or over 200, but it comes back down within 1-2 hours.  Settings changed in her [place] reader with goals for blood sugars instead of 90-120, to 70-180. She admits that she is snacking on this cookie at HS (8:30) because she is afraid of dropping her blood sugar low.  Discussed the importance of knowing what will happen without the cookie, and that she should not be eating up to her medication dose- which will make her gain weight. She will check tonight, and let me know if she drops below 70.   I told her that it appears that her meals are  balanced, but am unsure of quanties.  She is very vague on this. Discussed the different fats: saturate, non saturated, etc,,and their effects on cholesterol  Handout given on this and suggested she not watch carbs on her label, but saturated fat.  She will try to limit them to less than 22grams per day. Discussed what foods are high in saturated fat, and she took notes on this.  List also given to her on this.   Also suggested the need for exercise to raise HDL, and suggested she try walking briskly, of ok with her MD, for 30-40 min. 5 days per week, to help with her HDL.  Also discussed the differences between HDL, and LDL and goals for these levels.   Gave her praise for her diet, and her better blood sugar control and told there that this, plus exercise will help to curb her LDL, but may not be enough.  We discussed other pharmaceutical options, but she was not wanting this at this time.  She had no final questions.

## 2019-12-11 ENCOUNTER — Ambulatory Visit: Payer: Managed Care, Other (non HMO) | Admitting: Internal Medicine

## 2019-12-13 ENCOUNTER — Ambulatory Visit: Payer: Managed Care, Other (non HMO) | Admitting: Internal Medicine

## 2020-01-03 ENCOUNTER — Other Ambulatory Visit: Payer: Self-pay

## 2020-01-07 ENCOUNTER — Other Ambulatory Visit: Payer: Self-pay

## 2020-01-07 ENCOUNTER — Ambulatory Visit (INDEPENDENT_AMBULATORY_CARE_PROVIDER_SITE_OTHER): Payer: Managed Care, Other (non HMO) | Admitting: Internal Medicine

## 2020-01-07 ENCOUNTER — Encounter: Payer: Self-pay | Admitting: Internal Medicine

## 2020-01-07 VITALS — BP 102/60 | HR 80 | Ht 69.0 in | Wt 153.0 lb

## 2020-01-07 DIAGNOSIS — E1165 Type 2 diabetes mellitus with hyperglycemia: Secondary | ICD-10-CM

## 2020-01-07 DIAGNOSIS — E1142 Type 2 diabetes mellitus with diabetic polyneuropathy: Secondary | ICD-10-CM | POA: Diagnosis not present

## 2020-01-07 DIAGNOSIS — E7800 Pure hypercholesterolemia, unspecified: Secondary | ICD-10-CM | POA: Diagnosis not present

## 2020-01-07 LAB — POCT GLYCOSYLATED HEMOGLOBIN (HGB A1C): Hemoglobin A1C: 6.3 % — AB (ref 4.0–5.6)

## 2020-01-07 MED ORDER — TRULICITY 3 MG/0.5ML ~~LOC~~ SOAJ: 3.0000 mg | SUBCUTANEOUS | 3 refills | Status: DC

## 2020-01-07 NOTE — Progress Notes (Signed)
Patient ID: Charlotte Horn, female   DOB: 09-Jan-1961, 59 y.o.   MRN: [id number]   This visit occurred during the SARS-CoV-2 public health emergency.  Safety protocols were in place, including screening questions prior to the visit, additional usage of staff PPE, and extensive cleaning of exam room while observing appropriate contact time as indicated for disinfecting solutions.   HPI: Charlotte Horn is a 59 y.o.-year-old female,  returning for follow-up for DM2, dx in 2004, non-insulin-dependent, uncontrolled, without long-term complications.  Last visit 5 months ago.  Since last OV, she changed her diet as her husband is also on a diet (Optivia) and eliminated chips, cookies.  Reviewed HbA1c levels: Lab Results  Component Value Date   HGBA1C 6.3 (A) 08/06/2019   HGBA1C 6.2 (A) 11/14/2018   HGBA1C 6.1 (A) 07/11/2018  02/15/2018: HbA1c 7.0% 05/12/2017: HbA1c 9.1% 02/01/2017: HbA1c 7.0%  She is on: - Metformin ER 1000 mg 2x a day - Glipizide XL 5 mg before b'fast  - Glipizide IR 5 mg glipizide after her am hot shower or if getting hot  - Trulicity 1.5 mg weekly  She tried Byetta, Bydureon (1 year) - but had diarrhea. She had very low CBGs with regular Glipizide.  She discussed with her cardiologist about SGLT2 inh >> not absolutely needed for now..  Pt.checks her sugars more than 4 times a day with his CGM.  Freestyle libre CGM parameters: - Average: 125 - % active CGM time: 89% of the time - Glucose variability 32% (target < or = to 36%) - time in range:  - very low (<54): 0% - low (54-69): 3% - normal range (70-180): 87% - high sugars (181-250): 10% - very high sugars (>250): 0%    Previously:   Previously:  Lowest sugar was 45 (lowest ever) ... 70 >> 56 >> 74; she has hypoglycemia awareness in the 60s. Highest sugar was 337 >>...223 >> 255 >> 246 (vacation, Covid vaccine).  Her sugars are highest when her alarm goes off in the morning.  Freestyle  libre CGM parameters: - average: 117 +/- 34.8 >> 116 >> 135 >> 117 - coefficient of variation: 29.7% >> 28.3% >> 29.5% >> 31.8% - time in range:  - very low (<54): 2% - low (54-69): 4% - normal range (70-180): 87% - high sugars (181-250): 7% - very high sugars (>250): 0%  Glucometer: One Touch Ultra mini  Pt's meals are: - Breakfast: b'fast bar (<15g carbs), almonds, cereal - Lunch:  veggies, popcorn - Dinner: meat + veggies + rarely starch Diet sodas usually lower her sugars.  No CKD: 08/28/2019: CMP with slightly elevated glucose at 115, BUN/creatinine 10/0.61, GFR 101, ACR <7 02/01/2017: Glucose 185, BUN/creatinine 14/0.73, GFR 93. No results found for: BUN, CREATININE  On losartan.  -+ HL: Lab Results  Component Value Date   CHOL 205 (H) 10/29/2019   HDL 60 10/29/2019   LDLCALC 119 (H) 10/29/2019   TRIG 151 (H) 10/29/2019   CHOLHDL 3.4 10/29/2019  Previously:  08/28/2019: 240/159/57/154 02/01/2017: 253/166/59/161  She continues on fish oil and co-Q10 but off this that he might and statins due to side effects. She may need to add a PCSK9 inh.  - sees Dr. Debara Pickett.  - last eye exam was in 08/2019: No DR. Dr. Phineas Real.  -She has numbness and tingling in her feet.  On B complex and alpha-lipoic acid  On ASA 81.  Pt has FH of DM in father, PGM, 2 brothers, nephews  Works for  [employer].  ROS: Constitutional: no weight gain/no weight loss, no fatigue, no subjective hyperthermia, no subjective hypothermia Eyes: no blurry vision, no xerophthalmia ENT: no sore throat, no nodules palpated in neck, no dysphagia, no odynophagia, no hoarseness Cardiovascular: no CP/no SOB/no palpitations/no leg swelling Respiratory: no cough/no SOB/no wheezing Gastrointestinal: no N/no V/no D/no C/no acid reflux Musculoskeletal: no muscle aches/no joint aches Skin: no rashes, no hair loss Neurological: no tremors/+ numbness/+ tingling/no dizziness  I reviewed pt's medications, allergies,  PMH, social hx, family hx, and changes were documented in the history of present illness. Otherwise, unchanged from my initial visit note.  Past Medical History:  Diagnosis Date  . Family history of early CAD 04/11/2017  . Hyperlipidemia 04/11/2017   PSx H: - multiple eye Sx's: [id number] - C-section 1996 - Gall Bladder Sx 1984  Social History   Social History  . Marital status: Married    Spouse name: N/A  . Number of children: 1   Occupational History  . Reimbursement analyst   Social History Main Topics  . Smoking status: Never Smoker  . Smokeless tobacco: Never Used  . Alcohol use No  . Drug use: No   Current Outpatient Medications on File Prior to Visit  Medication Sig Dispense Refill  . Alpha-Lipoic Acid 100 MG CAPS Take 6 capsules by mouth 2 (two) times daily.    Marland Kitchen aspirin EC 81 MG tablet Take 81 mg by mouth daily.    Marland Kitchen b complex vitamins capsule Take 1 capsule by mouth daily.    . benzonatate (TESSALON) 200 MG capsule Take 1 capsule by mouth daily as needed.  0  . cholecalciferol (VITAMIN D) 1000 units tablet Take 5,000 Units by mouth daily.    Marland Kitchen co-enzyme Q-10 50 MG capsule Take 100 mg by mouth daily.    . Continuous Blood Gluc Receiver (FREESTYLE LIBRE 14 DAY READER) DEVI 1 each by Does not apply route as directed. 1 Device 0  . Continuous Blood Gluc Sensor (FREESTYLE LIBRE 14 DAY SENSOR) MISC USE DEVICE AS DIRECTED 2 each 17  . Cranberry 125 MG TABS Take 1 tablet by mouth daily.    . cyclobenzaprine (FLEXERIL) 10 MG tablet Take 10 mg by mouth 3 (three) times daily as needed for muscle spasms.    . Dulaglutide (TRULICITY) 1.5 MG/0.5ML SOPN INJECT THE CONTENTS OF 1 PEN SUBCUTANEOUSLY WEEKLY AS DIRECTED 12 pen 4  . fexofenadine (ALLEGRA) 180 MG tablet Take 1 tablet by mouth daily.    Marland Kitchen glipiZIDE (GLUCOTROL XL) 5 MG 24 hr tablet Take 1 tablet (5 mg total) by mouth daily. 90 tablet 3  . glipiZIDE (GLUCOTROL) 5 MG tablet TAKE 1 TABLET(5 MG) BY MOUTH TWICE DAILY BEFORE A  MEAL 180 tablet 3  . glucose blood (FREESTYLE TEST STRIPS) test strip Use once a day - for freestyle libre 100 each 3  . ipratropium (ATROVENT) 0.06 % nasal spray Place 1 spray into both nostrils.    Marland Kitchen losartan (COZAAR) 100 MG tablet Take 1 tablet (100 mg total) by mouth daily. 90 tablet 3  . Magnesium Oxide (MAG-200 PO) Take 1 tablet by mouth daily.    . metFORMIN (GLUCOPHAGE-XR) 500 MG 24 hr tablet TAKE 2 TABLETS BY MOUTH TWO TIMES DAILY 360 tablet 3  . methocarbamol (ROBAXIN) 500 MG tablet Take 500 mg by mouth every 8 (eight) hours as needed for muscle spasms.    . Multiple Vitamin (THERA) TABS Take 1 tablet by mouth daily.    . Omega-3 Fatty  Acids (FISH OIL) 1000 MG CAPS Take 2 capsules by mouth daily.     Marland Kitchen PROAIR HFA 108 (90 Base) MCG/ACT inhaler Inhale 2 puffs into the lungs as needed.  0  . sertraline (ZOLOFT) 100 MG tablet Take 1 tablet by mouth daily.    . traZODone (DESYREL) 100 MG tablet Take 1 tablet by mouth at bedtime.     No current facility-administered medications on file prior to visit.   Allergies  Allergen Reactions  . Statins Other (See Comments)    MUSCLE ACHES  . Prednisone    Family History  Problem Relation Age of Onset  . Ovarian cancer Mother   . Heart attack Father   . Kidney failure Father   . Sudden Cardiac Death Brother   . Ovarian cancer Maternal Grandmother   . Parkinson's disease Maternal Grandfather   . Heart failure Paternal Grandmother   . Breast cancer Neg Hx     PE: BP 102/60   Pulse 80   Ht 5\' 9"  (1.753 m)   Wt 153 lb (69.4 kg)   SpO2 97%   BMI 22.59 kg/m   Wt Readings from Last 3 Encounters:  01/07/20 153 lb (69.4 kg)  11/09/19 158 lb 11.2 oz (72 kg)  10/29/19 165 lb (74.8 kg)   Constitutional: overweight, in NAD Eyes: PERRLA, EOMI, no exophthalmos ENT: moist mucous membranes, no thyromegaly, no cervical lymphadenopathy Cardiovascular: RRR, No MRG Respiratory: CTA B Gastrointestinal: abdomen soft, NT, ND,  BS+ Musculoskeletal: no deformities, strength intact in all 4 Skin: moist, warm, no rashes Neurological: no tremor with outstretched hands, DTR normal in all 4  ASSESSMENT: 1. 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 HL  PLAN:  1. Patient with longstanding, previously uncontrolled type 2 diabetes, improved on a regimen of oral medication with Metformin and sulfonylurea and also weekly GLP-1 receptor agonist. -We are using instant release glipizide before her morning shower, as she feels that whenever she had very hot, her sugars increase.  However, lately, she has been taking the shower at different times of the day.  Therefore, at this visit, I advised her to stop the instant release glipizide and will continue only with the extended release.  However, since sugars still increase with her meals per review of her CGM downloads, we will go ahead and increase her Trulicity dose to 3 mg weekly.  She denies nausea or constipation, but she does have diarrhea after meals and I advised her to try to decrease the dose of Metformin to see if this improves. -We checked with her cardiologist about the need for an SGLT 2 inhibitor but this is not mandatory for her right now, so we will not add it at this visit.  This is a consideration for the future - I suggested to:  Patient Instructions  Please decrease: - Metformin ER 500 mg 2x a day  Please continue: - Glipizide XL 5 mg before b'fast   Stop: - Glipizide IR 5 mg glipizide at waking up in workdays  Increase: - Trulicity to 3 mg weekly   Please return in 4 months with your sugar log.   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 HL - Reviewed latest lipid panel from 10/2019: LDL above target, but much improved from 08/2019, when it was 154 Lab Results  Component Value Date   CHOL 205 (H) 10/29/2019   HDL 60 10/29/2019   LDLCALC 119 (H) 10/29/2019   TRIG 151 (H) 10/29/2019   CHOLHDL 3.4 10/29/2019  - Continues fish oil without side effects.  She had muscle pain and bone pain from statins and also muscle pain from Zetia.  She is now seen in the lipid clinic and the plan is to start PCSK9 inhibitor if her LDL does not improve to < 294 until the summer.  Carlus Pavlov, MD PhD [facility]

## 2020-01-07 NOTE — Patient Instructions (Addendum)
Please decrease: - Metformin ER 500 mg 2x a day  Please continue: - Glipizide XL 5 mg before b'fast   Stop: - Glipizide IR 5 mg glipizide at waking up in workdays  Increase: - Trulicity to 3 mg weekly   Please return in 4 months with your sugar log.

## 2020-01-07 NOTE — Addendum Note (Signed)
Addended by: Darliss Ridgel I on: 01/07/2020 04:39 PM   Modules accepted: Orders

## 2020-03-11 ENCOUNTER — Other Ambulatory Visit: Payer: Self-pay | Admitting: Internal Medicine

## 2020-03-11 DIAGNOSIS — E785 Hyperlipidemia, unspecified: Secondary | ICD-10-CM

## 2020-04-17 ENCOUNTER — Telehealth: Payer: Self-pay | Admitting: Internal Medicine

## 2020-04-17 NOTE — Telephone Encounter (Signed)
Left message for patient, dr hilty lab orders should be able to be seen by [facility]. She is to call back with questions.

## 2020-04-17 NOTE — Telephone Encounter (Signed)
New message    Patient states that she is having labs drawn at [facility] on [address] on 7-22.  She states that she needs labs drawn per Dr Rennis Golden.  Please fax lab order to [facility] on [address] so that she can only be stuck once.  Patient did not have their fax number.  If any problems, please call.

## 2020-04-23 ENCOUNTER — Other Ambulatory Visit: Payer: Self-pay | Admitting: Internal Medicine

## 2020-04-25 LAB — LIPID PANEL
Chol/HDL Ratio: 3.5 ratio (ref 0.0–4.4)
Cholesterol, Total: 227 mg/dL — ABNORMAL HIGH (ref 100–199)
HDL: 64 mg/dL (ref >39)
LDL Chol Calc (NIH): 145 mg/dL — ABNORMAL HIGH (ref 0–99)
Triglycerides: 100 mg/dL (ref 0–149)
VLDL Cholesterol Cal: 18 mg/dL (ref 5–40)

## 2020-04-28 ENCOUNTER — Ambulatory Visit: Payer: Managed Care, Other (non HMO) | Admitting: Physician Assistant

## 2020-05-02 ENCOUNTER — Other Ambulatory Visit: Payer: Self-pay | Admitting: *Deleted

## 2020-05-02 DIAGNOSIS — E785 Hyperlipidemia, unspecified: Secondary | ICD-10-CM

## 2020-05-02 MED ORDER — NEXLETOL 180 MG PO TABS: 1.0000 | ORAL_TABLET | Freq: Every day | ORAL | 3 refills | Status: DC

## 2020-05-05 ENCOUNTER — Other Ambulatory Visit: Payer: Self-pay

## 2020-05-05 ENCOUNTER — Telehealth: Payer: Self-pay | Admitting: Internal Medicine

## 2020-05-05 NOTE — Telephone Encounter (Signed)
Christine from [employer] calling to verify clinical information on PA for Nexletol.

## 2020-05-05 NOTE — Telephone Encounter (Signed)
PA for [facility] submitted via CMM  Key: [id number] - PA Case ID: [id number] - Rx #: [id number]

## 2020-05-07 NOTE — Telephone Encounter (Signed)
Nexletol is denied: sent to MD to advise  Per your health plan's criteria this drug is covered if you meet the following: (1) You have been diagnosed with one of the following: (A) A fat-related disorder (heterozygous familial hypercholesterolemia) with one of the following: (I) Both of the following: (a) Your low-density lipoprotein cholesterol (LDL-C) is 190 mg/dL (034 mg/dL if younger than 16) or more before starting treatment. (b) One of the following: (i) Your parent sibling or child had a heart attack when they were younger than 61. (ii) Your grandparent aunt uncle niece nephew grandchild half sibling or double cousin had a heart attack when they were younger than 57. (iii) You have family history of low-density lipoprotein cholesterol (LDL-C) that is more than 190 mg/dL. (iv) You have family history of a fat-related disorder (history of familial hypercholesterolemia). (v) You have family history of fatty deposit in a tendon (tendinous xanthomata) or a layer of cholesterol on the eye that caused a gray or white ring over the colored part of the eye (arcus cornealis). (II) Both of the following: (a) Your low-density lipoprotein cholesterol (LDL-C) is 190 mg/dL (742 mg/dL if younger than 83) [id number] All [employer] trademarks and logos are owned by [employer]. All other brand or product names are trademarks or registered marks of their respective owners.  2016 [employer] All rights reserved. [phone] Page 2 of 9 or more before starting treatment. (b) One of the following: (i) An abnormal gene (functional mutation in the low-density lipoprotein receptor apolipoprotein or proprotein convertase subtilisin/kexin type 9 gene). (ii) Fatty deposit in a tendon (tendinous xanthomata). (iii) A layer of cholesterol on the eye that caused a gray or white ring over the colored part of the eye (arcus cornealis) before age 2. (B) A cholesterol disorder due to plaque builds up inside the  arteries of your heart (atherosclerotic cardiovascular disease) as confirmed by one of the following: (I) Sudden lower blood flow to your heart (acute coronary syndromes). (II) A heart attack (history of myocardial infarction). (III) Chest pain (stable or unstable angina). (IV) Restored blood flow to the blood vessels in your heart (coronary or other arterial revascularization such as percutaneous coronary intervention or coronary artery bypass graft surgery). (V) Stroke. (VI) A temporary decrease of blood and oxygen to your brain (transient ischemic attack). (VII) Cholesterol and plaque buildup inside the arteries in your arms legs or other parts of your body other than your heart and brain presumed to be of heart origin (peripheral arterial disease presumed to be of atherosclerotic origin). (VIII) Severe heart conditions found through tests [clinically significant coronary heart disease diagnosed by invasive or noninvasive testing (for example: coronary angiography stress test using treadmill stress echocardiography or nuclear imaging)]. (2) One of the following: (A) You took one of the high-intensity cholesterol-lowering drugs (atorvastatin 40-80 mg or rosuvastatin 20-40 mg) for at least 12 weeks in a row and will continue to take it at a maximally tolerated dose. (B) Both of the following: (I) You had a bad reaction to a high-intensity cholesterol-lowering drug (statin) and had muscle symptoms for more than 2 weeks such as: (a) Muscle pain (myalgia without creatine kinase elevations). (b) Swelling or redness of your muscle (myositis with creatine kinase elevations less than 10 times upper limit of normal). (II) One of the following: (a) You took one of the moderate-intensity cholesterol-lowering drugs (atorvastatin 10-20 mg rosuvastatin 5-10 mg simvastatin 20-40 mg pravastatin 40-80 mg lovastatin 40 mg fluvastatin XL 80 mg fluvastatin 40 mg twice daily  or Livalo 2-4 mg) for at least  12 weeks in a row and will continue to take it at maximally tolerated dose. (b) You took one of the low-intensity cholesterol-lowering drugs (simvastatin 10 mg pravastatin 10- 20 mg lovastatin 20 mg fluvastatin 20-40 mg or Livalo 1 mg) for at least 12 weeks in a row and will continue to take it at maximally tolerated dose. (C) You had a bad reaction to a low- or moderate- and high-intensity cholesterol-lowering drugs (statin) and muscle symptoms for more than 2 weeks such as: (I) Muscle pain (myalgia without creatine kinase elevations). (II) Swelling or redness of your muscle (myositis with creatine kinase elevations less than 10 times upper limit of normal). (D) All cholesterol-lowering drugs (statins) are not good for you (contraindication to all statins). (E) While taking statins your muscle tissue started to break down (rhabdomyolysis) or you have other muscle symptoms (the amount of creatine kinase elevations greater than 10 times upper limit of normal).

## 2020-05-12 NOTE — Telephone Encounter (Signed)
Hmm - no bempedoic acid - cannot take statins, welchol, zetia - not much left.  Would try appeal for PCSK9i.  Dr Rexene Edison

## 2020-05-12 NOTE — Telephone Encounter (Signed)
Patient notified via MyChart message that MD has recommended a PCSK9 - will await reply

## 2020-05-14 MED ORDER — REPATHA SURECLICK 140 MG/ML ~~LOC~~ SOAJ: 1.0000 | SUBCUTANEOUS | 11 refills | Status: DC

## 2020-05-14 NOTE — Telephone Encounter (Signed)
PA for repatha submitted via CMM (Key: [id number])

## 2020-05-14 NOTE — Telephone Encounter (Signed)
Repatha Rx sent to [facility]. Asked for pharmacy to generate PA if one is necessary

## 2020-05-14 NOTE — Addendum Note (Signed)
Addended by: Lindell Spar on: 05/14/2020 08:57 AM   Modules accepted: Orders

## 2020-05-16 NOTE — Telephone Encounter (Addendum)
Repatha has been denied. Per fax received: "Repatha is not FDA approved or supported by an accepted medical resource for the treatment of your medical condition(s): a condition where your cholesterol and/or triglyceride levels in your blood are too high or too low (dyslipidemia) and a disease with high blood sugar levels (type 2 diabetes mellitus)"  The plan allows [place] to cover a drug only when it is used for a "medically accepted indication". A medically accepted indication means the use is approved by the FDA or one of the following medical resources: 2 articles from a major peer-reviewed medical journal, the [facility] Formulary Service Drug Information, DRUGDEX System by [employer].   To file an appeal: please send any written comments, documents or other relevate documentation with appeal to fax: [phone]  Phone for appeals coordinator: [phone]

## 2020-05-16 NOTE — Telephone Encounter (Signed)
Not sure we can get this covered based on their response. Since she has a zero CAC score - would optimize diet and lifestyle- could consider earlier repeat CAC testing in 5 years.  Dr Rexene Edison

## 2020-05-19 NOTE — Telephone Encounter (Signed)
Patient notified of MD reply via MyChart Called [facility] with update

## 2020-05-19 NOTE — Telephone Encounter (Signed)
Susie from [employer] calling to find out if an appeal has been filed.

## 2020-05-20 ENCOUNTER — Other Ambulatory Visit: Payer: Self-pay

## 2020-05-27 ENCOUNTER — Ambulatory Visit (INDEPENDENT_AMBULATORY_CARE_PROVIDER_SITE_OTHER): Payer: Managed Care, Other (non HMO) | Admitting: Internal Medicine

## 2020-05-27 ENCOUNTER — Other Ambulatory Visit: Payer: Self-pay

## 2020-05-27 ENCOUNTER — Encounter: Payer: Self-pay | Admitting: Internal Medicine

## 2020-05-27 VITALS — BP 118/60 | HR 82 | Ht 69.0 in | Wt 144.0 lb

## 2020-05-27 DIAGNOSIS — E1165 Type 2 diabetes mellitus with hyperglycemia: Secondary | ICD-10-CM

## 2020-05-27 DIAGNOSIS — E1142 Type 2 diabetes mellitus with diabetic polyneuropathy: Secondary | ICD-10-CM | POA: Diagnosis not present

## 2020-05-27 DIAGNOSIS — E7800 Pure hypercholesterolemia, unspecified: Secondary | ICD-10-CM | POA: Diagnosis not present

## 2020-05-27 LAB — POCT GLYCOSYLATED HEMOGLOBIN (HGB A1C): Hemoglobin A1C: 6.4 % — AB (ref 4.0–5.6)

## 2020-05-27 MED ORDER — DAPAGLIFLOZIN PROPANEDIOL 5 MG PO TABS: 5.0000 mg | ORAL_TABLET | Freq: Every day | ORAL | 3 refills | Status: DC

## 2020-05-27 NOTE — Patient Instructions (Addendum)
Please continue: - Metformin ER 500 mg 2x a day - Glipizide XL 5 mg before b'fast  - Trulicity 3 mg weekly   Please start: - Farxiga 5 mg before b'fast  Please return in 4 months with your sugar log.

## 2020-05-27 NOTE — Progress Notes (Signed)
Patient ID: Charlotte Horn, female   DOB: 08/16/1961, 59 y.o.   MRN: [id number]   This visit occurred during the SARS-CoV-2 public health emergency.  Safety protocols were in place, including screening questions prior to the visit, additional usage of staff PPE, and extensive cleaning of exam room while observing appropriate contact time as indicated for disinfecting solutions.   HPI: Charlotte Horn is a 59 y.o.-year-old female,  returning for follow-up for DM2, dx in 2004, non-insulin-dependent, uncontrolled, without long-term complications.  Last visit 4.5 months ago.  Since last OV,  in June, she ran out of Sertraline >> stress increased >> sugars higher. She restarted 1 mo ago.  She was working from home during the coronavirus pandemic.  Her group (at [facility]) recently made the decision to not return to work in person.  Before last visit, she changed her diet as her husband also started on Optivia diet and she also eliminated sweets.  Reviewed HbA1c levels: Lab Results  Component Value Date   HGBA1C 6.3 (A) 01/07/2020   HGBA1C 6.3 (A) 08/06/2019   HGBA1C 6.2 (A) 11/14/2018  02/15/2018: HbA1c 7.0% 05/12/2017: HbA1c 9.1% 02/01/2017: HbA1c 7.0%  She is on: - Metformin ER 1000 >> 500 mg 2x a day - Glipizide XL 5 mg before b'fast  - - Trulicity 1.5 >> 3 mg weekly  She tried Byetta, Bydureon (1 year) - but had diarrhea. She had very low CBGs with regular Glipizide.  She checks her sugars more than 4 times a day with her freestyle libre CGM:  Freestyle libre CGM parameters: - Average: 125 >> 123  - % active CGM time: 89% >> 95% of the time - Glucose variability 32% >> 34.3% (target < or = to 36%) - CMI: 6.3% - time in range:  - very low (<54): 0% >> 1% - low (54-69): 3% >> 6% - normal range (70-180): 87% >> 84% - high sugars (181-250): 10% >> 8% - very high sugars (>250): 0% >> 1%    Prev:   Previously:   Lowest sugar was 45 (lowest ever) ... 70 >> 56  >> 74 >> 50s; she has hypoglycemia awareness in the 60s. Highest sugar was 337 >>...223 >> 255 >> 246 (vacation, Covid vaccine) >> 285 (hamburger + icecream).  Freestyle libre CGM parameters: - average: 117 +/- 34.8 >> 116 >> 135 >> 117 - coefficient of variation: 29.7% >> 28.3% >> 29.5% >> 31.8% - time in range:  - very low (<54): 2% - low (54-69): 4% - normal range (70-180): 87% - high sugars (181-250): 7% - very high sugars (>250): 0%  Glucometer: One Touch Ultra mini  Pt's meals are: - Breakfast: b'fast bar (<15g carbs), almonds, cereal >> yoghurt + granola, nuts or skips - Lunch:  veggies, popcorn - Dinner: meat + veggies + rarely starch Diet sodas usually lower her sugars.  No CKD: 08/28/2019: CMP with slightly elevated glucose at 115, BUN/creatinine 10/0.61, GFR 101, ACR <7 02/01/2017: Glucose 185, BUN/creatinine 14/0.73, GFR 93. No results found for: BUN, CREATININE  On losartan.  + HL: Lab Results  Component Value Date   CHOL 227 (H) 04/24/2020   HDL 64 04/24/2020   LDLCALC 145 (H) 04/24/2020   TRIG 100 04/24/2020   CHOLHDL 3.5 04/24/2020  Previously:  08/28/2019: 240/159/57/154 02/01/2017: 253/166/59/161  On co-Q10 and fish oil and Nexletol PA was started by the lipid clinic >> denied.   - last eye exam was in 08/2019: No DR. Dr. Audie Box.  -+ Numbness  and tingling in her feet.  On B complex and alpha-lipoic acid.  On ASA 81.  Pt has FH of DM in father, PGM, 2 brothers, nephews  She works for [employer].  ROS: Constitutional: no weight gain/+ weight loss, no fatigue, no subjective hyperthermia, no subjective hypothermia Eyes: no blurry vision, no xerophthalmia ENT: no sore throat, no nodules palpated in neck, no dysphagia, no odynophagia, no hoarseness Cardiovascular: no CP/no SOB/no palpitations/no leg swelling Respiratory: no cough/no SOB/no wheezing Gastrointestinal: no N/no V/no D/no C/no acid reflux Musculoskeletal: no muscle aches/no joint  aches Skin: no rashes, no hair loss Neurological: no tremors/+ numbness/+ tingling/no dizziness  I reviewed pt's medications, allergies, PMH, social hx, family hx, and changes were documented in the history of present illness. Otherwise, unchanged from my initial visit note.  Past Medical History:  Diagnosis Date  . Family history of early CAD 04/11/2017  . Hyperlipidemia 04/11/2017   PSx H: - multiple eye Sx's: [id number] - C-section 1996 - Gall Bladder Sx 1984  Social History   Social History  . Marital status: Married    Spouse name: N/A  . Number of children: 1   Occupational History  . Reimbursement analyst   Social History Main Topics  . Smoking status: Never Smoker  . Smokeless tobacco: Never Used  . Alcohol use No  . Drug use: No   Current Outpatient Medications on File Prior to Visit  Medication Sig Dispense Refill  . Alpha-Lipoic Acid 100 MG CAPS Take 6 capsules by mouth 2 (two) times daily.    Marland Kitchen. aspirin EC 81 MG tablet Take 81 mg by mouth daily.    Marland Kitchen. b complex vitamins capsule Take 1 capsule by mouth daily.    . cholecalciferol (VITAMIN D) 1000 units tablet Take 5,000 Units by mouth daily.    Marland Kitchen. co-enzyme Q-10 50 MG capsule Take 100 mg by mouth daily.    . Continuous Blood Gluc Receiver (FREESTYLE LIBRE 14 DAY READER) DEVI 1 each by Does not apply route as directed. 1 Device 0  . Continuous Blood Gluc Sensor (FREESTYLE LIBRE 14 DAY SENSOR) MISC USE DEVICE AS DIRECTED 2 each 17  . Cranberry 125 MG TABS Take 1 tablet by mouth daily.    . cyclobenzaprine (FLEXERIL) 10 MG tablet Take 10 mg by mouth 3 (three) times daily as needed for muscle spasms.    . Dulaglutide (TRULICITY) 3 MG/0.5ML SOPN Inject 3 mg into the skin once a week. 12 pen 3  . Evolocumab (REPATHA SURECLICK) 140 MG/ML SOAJ Inject 1 Dose into the skin every 14 (fourteen) days. 2.1 mL 11  . fexofenadine (ALLEGRA) 180 MG tablet Take 1 tablet by mouth daily.    Marland Kitchen. glipiZIDE (GLUCOTROL XL) 5 MG 24 hr tablet  Take 1 tablet (5 mg total) by mouth daily. 90 tablet 3  . glucose blood (FREESTYLE TEST STRIPS) test strip Use once a day - for freestyle libre 100 each 3  . ipratropium (ATROVENT) 0.06 % nasal spray Place 1 spray into both nostrils.    Marland Kitchen. losartan (COZAAR) 100 MG tablet TAKE 1 TABLET(100 MG) BY MOUTH DAILY 90 tablet 3  . Magnesium Oxide (MAG-200 PO) Take 1 tablet by mouth daily.    . metFORMIN (GLUCOPHAGE-XR) 500 MG 24 hr tablet TAKE 2 TABLETS BY MOUTH TWO TIMES DAILY 360 tablet 3  . methocarbamol (ROBAXIN) 500 MG tablet Take 500 mg by mouth every 8 (eight) hours as needed for muscle spasms.    . Multiple Vitamin (THERA) TABS  Take 1 tablet by mouth daily.    . Omega-3 Fatty Acids (FISH OIL) 1000 MG CAPS Take 2 capsules by mouth daily.     Marland Kitchen PROAIR HFA 108 (90 Base) MCG/ACT inhaler Inhale 2 puffs into the lungs as needed.  0  . sertraline (ZOLOFT) 100 MG tablet Take 1 tablet by mouth daily.    . traZODone (DESYREL) 100 MG tablet Take 1 tablet by mouth at bedtime.    . benzonatate (TESSALON) 200 MG capsule Take 1 capsule by mouth daily as needed. (Patient not taking: Reported on 05/27/2020)  0   No current facility-administered medications on file prior to visit.   Allergies  Allergen Reactions  . Statins Other (See Comments)    MUSCLE ACHES  . Prednisone    Family History  Problem Relation Age of Onset  . Ovarian cancer Mother   . Heart attack Father   . Kidney failure Father   . Sudden Cardiac Death Brother   . Ovarian cancer Maternal Grandmother   . Parkinson's disease Maternal Grandfather   . Heart failure Paternal Grandmother   . Breast cancer Neg Hx     PE: BP 118/60   Pulse 82   Ht 5\' 9"  (1.753 m)   Wt 144 lb (65.3 kg)   SpO2 96%   BMI 21.27 kg/m   Wt Readings from Last 3 Encounters:  05/27/20 144 lb (65.3 kg)  01/07/20 153 lb (69.4 kg)  11/09/19 158 lb 11.2 oz (72 kg)   Constitutional: Normal weight, in NAD Eyes: PERRLA, EOMI, no exophthalmos ENT: moist mucous  membranes, no thyromegaly, no cervical lymphadenopathy Cardiovascular: RRR, No MRG Respiratory: CTA B Gastrointestinal: abdomen soft, NT, ND, BS+ Musculoskeletal: no deformities, strength intact in all 4 Skin: moist, warm, no rashes Neurological: no tremor with outstretched hands, DTR normal in all 4  ASSESSMENT: 1. 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 HL  PLAN:  1. Patient with longstanding, previously uncontrolled type 2 diabetes improved on a regimen of oral medication with Metformin, sulfonylurea, and then weekly GLP-1 receptor agonist.  We were using instant release glipizide before her morning shower as she felt that hot showers will decrease her blood sugars.  However, she was taking a shower different times of the day at last visit so we stopped the instant release glipizide and only continued with the extended release glipizide.  However, since her sugars were increased after meals, I also advised her to increase the Trulicity dose to 3 mg weekly.  She had diarrhea at last visit and we decreased the Metformin dose to only 500 mg twice a day. -She lost 9 pounds since last visit -working on diet CGM interpretation: -At this visit, we reviewed together her CGM tracings and appeared that 84% of her blood sugars are at goal.  Few patterns are not easily discernible: Her sugars are at goal anywhere occasional slightly lower at night, but they increase in the morning even without eating breakfast.  Also, sugars usually increase after lunch and she has a very minimal increase after dinner.  After 9 PM, sugars start to decrease with a nadir at 12 AM after which they start increasing but in the normal range. -At this visit, discussed about changing her breakfast to eliminate the high glycemic index granola but include nuts, which she sometimes eats in the morning.  Also, to improve her sugars during daytime, I suggested to add a low dose  01/07/20.  She has  a history of yeast infections, but not recently.  We discussed about staying well-hydrated and also advised about possible dizziness with standing up visit dehydration, especially with her normal blood pressure. - I suggested to:  Patient Instructions  Please continue: - Metformin ER 500 mg 2x a day - Glipizide XL 5 mg before b'fast  - Trulicity 3 mg weekly   Please start: - Farxiga 5 mg before b'fast  Please return in 4 months with your sugar log.   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 HL -Reviewed latest lipid panel from 04/2020: LDL higher than before, above goal: Lab Results  Component Value Date   CHOL 227 (H) 04/24/2020   HDL 64 04/24/2020   LDLCALC 145 (H) 04/24/2020   TRIG 100 04/24/2020   CHOLHDL 3.5 04/24/2020  -Continues fish oil without side effects.  She had muscle pain and bone pain from statins and muscle pain from Zetia. PA for several cholesterol medications were denied.  An appeal may be sent with information about her brother dying at 29 from massive heart attack and his father dying in his 76s from heart disease.  Carlus Pavlov, MD PhD [facility]

## 2020-06-02 NOTE — Telephone Encounter (Signed)
Aggie Cosier from [employer] called to check on the authorization of this patients repatha. Please advise.   Claim number: [id number]

## 2020-06-02 NOTE — Telephone Encounter (Signed)
Any idea on this?  

## 2020-06-02 NOTE — Telephone Encounter (Signed)
I don't have her listed as one of my patients. Looks like it was prescribed under hilty

## 2020-06-03 NOTE — Telephone Encounter (Signed)
[facility] notified med is not approved, no appeal to be submitted

## 2020-07-21 ENCOUNTER — Other Ambulatory Visit: Payer: Self-pay | Admitting: *Deleted

## 2020-07-21 DIAGNOSIS — E785 Hyperlipidemia, unspecified: Secondary | ICD-10-CM

## 2020-07-23 ENCOUNTER — Other Ambulatory Visit: Payer: Self-pay

## 2020-07-23 ENCOUNTER — Telehealth: Payer: Self-pay | Admitting: Internal Medicine

## 2020-07-23 ENCOUNTER — Other Ambulatory Visit: Payer: Self-pay | Admitting: Internal Medicine

## 2020-07-23 MED ORDER — FLUCONAZOLE 150 MG PO TABS: 150.0000 mg | ORAL_TABLET | Freq: Once | ORAL | 1 refills | Status: AC

## 2020-07-23 NOTE — Telephone Encounter (Signed)
Patient asked if she could have a refill for her medication for yeast infection - she can't remember the name of the medication but she said it usually only gets quantities of 2 but she was asking for more than that since its hard to get through to our office to request refills.  [id number] - [place], [place] - [address] Phone:  [phone]  Fax:  [phone]

## 2020-07-23 NOTE — Telephone Encounter (Signed)
Okay to refill.  Thanks.

## 2020-07-23 NOTE — Telephone Encounter (Signed)
Ok to send Diflucan 150 mg 1 tab with 1 refill. She can always send me a msg if she needs a refill, rather than call.

## 2020-07-23 NOTE — Telephone Encounter (Signed)
RX sent to pharmacy  

## 2020-08-09 LAB — LIPID PANEL
Chol/HDL Ratio: 3.9 ratio (ref 0.0–4.4)
Cholesterol, Total: 250 mg/dL — ABNORMAL HIGH (ref 100–199)
HDL: 64 mg/dL (ref >39)
LDL Chol Calc (NIH): 171 mg/dL — ABNORMAL HIGH (ref 0–99)
Triglycerides: 89 mg/dL (ref 0–149)
VLDL Cholesterol Cal: 15 mg/dL (ref 5–40)

## 2020-08-12 ENCOUNTER — Encounter: Payer: Self-pay | Admitting: Internal Medicine

## 2020-08-12 ENCOUNTER — Ambulatory Visit (INDEPENDENT_AMBULATORY_CARE_PROVIDER_SITE_OTHER): Payer: Managed Care, Other (non HMO) | Admitting: Internal Medicine

## 2020-08-12 ENCOUNTER — Other Ambulatory Visit: Payer: Self-pay

## 2020-08-12 VITALS — BP 90/70 | HR 75 | Ht 69.0 in | Wt 144.0 lb

## 2020-08-12 DIAGNOSIS — M791 Myalgia, unspecified site: Secondary | ICD-10-CM | POA: Diagnosis not present

## 2020-08-12 DIAGNOSIS — Z8249 Family history of ischemic heart disease and other diseases of the circulatory system: Secondary | ICD-10-CM | POA: Diagnosis not present

## 2020-08-12 DIAGNOSIS — T466X5A Adverse effect of antihyperlipidemic and antiarteriosclerotic drugs, initial encounter: Secondary | ICD-10-CM

## 2020-08-12 DIAGNOSIS — E119 Type 2 diabetes mellitus without complications: Secondary | ICD-10-CM | POA: Diagnosis not present

## 2020-08-12 DIAGNOSIS — E7849 Other hyperlipidemia: Secondary | ICD-10-CM

## 2020-08-12 NOTE — Progress Notes (Signed)
LIPID CLINIC CONSULT NOTE  Chief Complaint:  Manage dyslipidemia  Primary Care Physician: Kendrick Ranch, MD  Primary Cardiologist:  No primary care provider on file.  HPI:  Charlotte Horn is a 59 y.o. female who is being seen today for the evaluation of dyslipidemia at the request of Dr. Duke Salvia.  This is a pleasant 58 year old female kindly referred by Dr. Duke Salvia for evaluation management of dyslipidemia.  Past medical history significant for early onset heart disease in her brother and father as well as dyslipidemia.  She also has type 2 diabetes managed on oral medications with an A1c of 6.3.  She is on losartan however she said this is for renal protection and denies a history of hypertension although the chart does have this listed as a diagnosis.  Blood pressure is low normal today.  Unfortunately, she has a history of statin intolerance.  She has tried numerous statins including simvastatin, atorvastatin, rosuvastatin, pravastatin and Livalo, as well as WelChol, niacin and ezetimibe, all which cause myalgias.  She had coronary calcium scoring in September 2018 which was 0 and is reassuring.  She has been trying to optimize her diet but felt that she could benefit from some more dietary information given her diabetes as well as the need to lower cholesterol.  Her most recent lipids were in February 2020 which showed total cholesterol 230, HDL 47, LDL 147 triglycerides 181.  08/12/2020  [name] returns today for follow-up.  Unfortunately she was denied to PCSK9 inhibitor.  The benefits of her lipid-lowering have dissipated and her cholesterol has continued to climb to untreated levels.  More recently her total cholesterol 250, HDL 64, triglycerides 89 and LDL 171.  Although this is not greater than 190, I do suspect this represents a familial hyperlipidemia given strong family history of heart disease in both her brother who died of MI at age 33 and father who had  heart disease.  Additionally she has risk factors including type 2 diabetes.  Current studies indicate that it is possible to have a familial hyperlipidemia with LDL cholesterol as low as 637 untreated.  While she had no coronary artery calcification, this does not exclude her from cardiovascular risk if she has FH.  PMHx:  Past Medical History:  Diagnosis Date  . Family history of early CAD 04/11/2017  . Hyperlipidemia 04/11/2017    No past surgical history on file.  FAMHx:  Family History  Problem Relation Age of Onset  . Ovarian cancer Mother   . Heart attack Father   . Kidney failure Father   . Sudden Cardiac Death Brother   . Ovarian cancer Maternal Grandmother   . Parkinson's disease Maternal Grandfather   . Heart failure Paternal Grandmother   . Breast cancer Neg Hx     SOCHx:   reports that she has never smoked. She has never used smokeless tobacco. No history on file for alcohol use and drug use.  ALLERGIES:  Allergies  Allergen Reactions  . Statins Other (See Comments)    MUSCLE ACHES  . Prednisone     ROS: Pertinent items noted in HPI and remainder of comprehensive ROS otherwise negative.  HOME MEDS: Current Outpatient Medications on File Prior to Visit  Medication Sig Dispense Refill  . Alpha-Lipoic Acid 100 MG CAPS Take 6 capsules by mouth 2 (two) times daily.    Marland Kitchen aspirin EC 81 MG tablet Take 81 mg by mouth daily.    Marland Kitchen b complex vitamins capsule  Take 1 capsule by mouth daily.    . cholecalciferol (VITAMIN D) 1000 units tablet Take 5,000 Units by mouth daily.    Marland Kitchen co-enzyme Q-10 50 MG capsule Take 100 mg by mouth daily.    . Continuous Blood Gluc Receiver (FREESTYLE LIBRE 14 DAY READER) DEVI 1 each by Does not apply route as directed. 1 Device 0  . Continuous Blood Gluc Sensor (FREESTYLE LIBRE 14 DAY SENSOR) MISC USE DEVICE AS DIRECTED 2 each 17  . Cranberry 125 MG TABS Take 1 tablet by mouth daily.    . cyclobenzaprine (FLEXERIL) 10 MG tablet Take 10 mg by  mouth 3 (three) times daily as needed for muscle spasms.    . dapagliflozin propanediol (FARXIGA) 5 MG TABS tablet Take 1 tablet (5 mg total) by mouth daily before breakfast. 90 tablet 3  . Dulaglutide (TRULICITY) 3 MG/0.5ML SOPN Inject 3 mg into the skin once a week. 12 pen 3  . Evolocumab (REPATHA SURECLICK) 140 MG/ML SOAJ Inject 1 Dose into the skin every 14 (fourteen) days. 2.1 mL 11  . fexofenadine (ALLEGRA) 180 MG tablet Take 1 tablet by mouth daily.    Marland Kitchen glipiZIDE (GLUCOTROL XL) 5 MG 24 hr tablet TAKE 1 TABLET(5 MG) BY MOUTH DAILY 90 tablet 3  . glucose blood (FREESTYLE TEST STRIPS) test strip Use once a day - for freestyle libre 100 each 3  . ipratropium (ATROVENT) 0.06 % nasal spray Place 1 spray into both nostrils.    Marland Kitchen losartan (COZAAR) 100 MG tablet TAKE 1 TABLET(100 MG) BY MOUTH DAILY 90 tablet 3  . Magnesium Oxide (MAG-200 PO) Take 1 tablet by mouth daily.    . metFORMIN (GLUCOPHAGE-XR) 500 MG 24 hr tablet TAKE 2 TABLETS BY MOUTH TWO TIMES DAILY 360 tablet 3  . methocarbamol (ROBAXIN) 500 MG tablet Take 500 mg by mouth every 8 (eight) hours as needed for muscle spasms.    . Multiple Vitamin (THERA) TABS Take 1 tablet by mouth daily.    . Omega-3 Fatty Acids (FISH OIL) 1000 MG CAPS Take 2 capsules by mouth daily.     Marland Kitchen PROAIR HFA 108 (90 Base) MCG/ACT inhaler Inhale 2 puffs into the lungs as needed.  0  . sertraline (ZOLOFT) 100 MG tablet Take 1 tablet by mouth daily.    . traZODone (DESYREL) 100 MG tablet Take 1 tablet by mouth at bedtime.    . benzonatate (TESSALON) 200 MG capsule Take 1 capsule by mouth daily as needed. (Patient not taking: Reported on 05/27/2020)  0   No current facility-administered medications on file prior to visit.    LABS/IMAGING: No results found for this or any previous visit (from the past 48 hour(s)). No results found.  LIPID PANEL:    Component Value Date/Time   CHOL 250 (H) 08/08/2020 0855   TRIG 89 08/08/2020 0855   HDL 64 08/08/2020 0855    CHOLHDL 3.9 08/08/2020 0855   LDLCALC 171 (H) 08/08/2020 0855    WEIGHTS: Wt Readings from Last 3 Encounters:  08/12/20 144 lb (65.3 kg)  05/27/20 144 lb (65.3 kg)  01/07/20 153 lb (69.4 kg)    VITALS: BP 90/70   Pulse 75   Ht 5\' 9"  (1.753 m)   Wt 144 lb (65.3 kg)   SpO2 98%   BMI 21.27 kg/m   EXAM: Deferred  EKG: Deferred  ASSESSMENT: 1. Possible familial hyperlipidemia - LDL 171 2. Statin intolerance-myalgias 3. Strong family history of premature coronary disease in father, brother 64.  Type 2 diabetes, not on insulin-A1c 6.3 5. 0 CAC score (2018)  PLAN: 1.   Ms. Pingleton has a possible familial hyperlipidemia and although her LDL is not over 190, she has a strong family history of early coronary disease.  She has good control of her type 2 diabetes but cholesterol remains very elevated.  She was denied PCSK9 inhibitors I suspect because of her lack of coronary calcium in 2018 however I believe her risk is still significantly elevated.  We will go ahead and pursue genetic testing for FH but I would consider her as having a clinical variant of familial hyperlipidemia noting that many studies indicate that patients can have FH with LDL cholesterol as low as 820.  There does seem to be a strong inheritance pattern.  We will again submit an appeal for PCSK9 inhibitor therapy since she has been intolerant of numerous statins.  Follow-up in 3 months.  Chrystie Nose, MD, [facility], FACP  [place]  [facility]  Medical Director of the Advanced Lipid Disorders &  Cardiovascular Risk Reduction Clinic Diplomate of the American Board of Clinical Lipidology Attending Cardiologist  Direct Dial: [phone]  Fax: [phone]  Website:  [email or web address] 08/12/2020, 4:57 PM

## 2020-08-12 NOTE — Patient Instructions (Signed)
Medication Instructions:  We will work on getting a PCSK9 (Repatha) approved with insurance  *If you need a refill on your cardiac medications before your next appointment, please call your pharmacy*   Lab Work: [facility] genetic cholesterol screening test  FASTING lipid panel in 3-4 months  If you have labs (blood work) drawn today and your tests are completely normal, you will receive your results only by: Marland Kitchen MyChart Message (if you have MyChart) OR . A paper copy in the mail If you have any lab test that is abnormal or we need to change your treatment, we will call you to review the results.   Follow-Up: At [facility], you and your health needs are our priority.  As part of our continuing mission to provide you with exceptional heart care, we have created designated Provider Care Teams.  These Care Teams include your primary Cardiologist (physician) and Advanced Practice Providers (APPs -  Physician Assistants and Nurse Practitioners) who all work together to provide you with the care you need, when you need it.  We recommend signing up for the patient portal called "MyChart".  Sign up information is provided on this After Visit Summary.  MyChart is used to connect with patients for Virtual Visits (Telemedicine).  Patients are able to view lab/test results, encounter notes, upcoming appointments, etc.  Non-urgent messages can be sent to your provider as well.   To learn more about what you can do with MyChart, go to [email or web address].    Your next appointment:   3-4 month(s)  The format for your next appointment:   In Person  Provider:   K. Italy Hilty MD  Other Instructions

## 2020-08-13 ENCOUNTER — Telehealth: Payer: Self-pay | Admitting: Internal Medicine

## 2020-08-13 NOTE — Telephone Encounter (Signed)
PA request denied - printed denial reasons for MD review

## 2020-08-13 NOTE — Telephone Encounter (Signed)
Await genetic test results.  Dr Rexene Edison

## 2020-08-13 NOTE — Telephone Encounter (Signed)
PA for repatha sureclick submitted via CMM (Key: [id number])

## 2020-08-13 NOTE — Addendum Note (Signed)
Addended by: Lindell Spar on: 08/13/2020 07:58 AM   Modules accepted: Orders

## 2020-09-02 LAB — GENESEQ: CORONARY ARTERY 3GENE

## 2020-09-03 ENCOUNTER — Encounter: Payer: Self-pay | Admitting: Family Medicine

## 2020-09-03 ENCOUNTER — Ambulatory Visit (INDEPENDENT_AMBULATORY_CARE_PROVIDER_SITE_OTHER): Payer: Managed Care, Other (non HMO) | Admitting: Family Medicine

## 2020-09-03 ENCOUNTER — Other Ambulatory Visit: Payer: Self-pay

## 2020-09-03 VITALS — BP 122/70 | HR 77 | Temp 97.3°F | Ht 69.0 in | Wt 143.4 lb

## 2020-09-03 DIAGNOSIS — Z0000 Encounter for general adult medical examination without abnormal findings: Secondary | ICD-10-CM

## 2020-09-03 DIAGNOSIS — Z8249 Family history of ischemic heart disease and other diseases of the circulatory system: Secondary | ICD-10-CM | POA: Diagnosis not present

## 2020-09-03 DIAGNOSIS — E782 Mixed hyperlipidemia: Secondary | ICD-10-CM

## 2020-09-03 DIAGNOSIS — L219 Seborrheic dermatitis, unspecified: Secondary | ICD-10-CM

## 2020-09-03 DIAGNOSIS — E1142 Type 2 diabetes mellitus with diabetic polyneuropathy: Secondary | ICD-10-CM

## 2020-09-03 DIAGNOSIS — Z23 Encounter for immunization: Secondary | ICD-10-CM | POA: Diagnosis not present

## 2020-09-03 DIAGNOSIS — F5104 Psychophysiologic insomnia: Secondary | ICD-10-CM | POA: Insufficient documentation

## 2020-09-03 DIAGNOSIS — F411 Generalized anxiety disorder: Secondary | ICD-10-CM | POA: Insufficient documentation

## 2020-09-03 DIAGNOSIS — J301 Allergic rhinitis due to pollen: Secondary | ICD-10-CM

## 2020-09-03 MED ORDER — TRIAMCINOLONE ACETONIDE 0.025 % EX LOTN: TOPICAL_LOTION | CUTANEOUS | 2 refills | Status: DC

## 2020-09-03 MED ORDER — METFORMIN HCL ER 500 MG PO TB24
ORAL_TABLET | ORAL | 3 refills | Status: DC
Start: 2020-09-03 — End: 2020-12-12

## 2020-09-03 MED ORDER — SERTRALINE HCL 100 MG PO TABS
100.0000 mg | ORAL_TABLET | Freq: Every day | ORAL | 3 refills | Status: DC
Start: 2020-09-03 — End: 2021-11-30

## 2020-09-03 MED ORDER — TRAZODONE HCL 100 MG PO TABS
100.0000 mg | ORAL_TABLET | Freq: Every day | ORAL | 3 refills | Status: DC
Start: 2020-09-03 — End: 2021-09-15

## 2020-09-03 NOTE — Patient Instructions (Addendum)
Please return in 6 months for recheck.   I will release your lab results to you on your MyChart account with further instructions. Please reply with any questions.   Today you were given your Pneumovax vaccination. It protects you from pneumonia.   It was a pleasure meeting you today! Thank you for choosing [place] to meet your healthcare needs! I truly look forward to working with you. If you have any questions or concerns, please send me a message via Mychart or call the office at [phone].  Please do these things to maintain good health!   Exercise at least 30-45 minutes a day,  4-5 days a week.   Eat a low-fat diet with lots of fruits and vegetables, up to 7-9 servings per day.  Drink plenty of water daily. Try to drink 8 8oz glasses per day.  Seatbelts can save your life. Always wear your seatbelt.  Place Smoke Detectors on every level of your home and check batteries every year.  Schedule an appointment with an eye doctor for an eye exam every 1-2 years  Avoid heavy alcohol use. If you drink, keep it to less than 2 drinks/day and not every day.  Health Care Power of Attorney.  Choose someone you trust that could speak for you if you became unable to speak for yourself.  Depression is common in our stressful world.If you're feeling down or losing interest in things you normally enjoy, please come in for a visit.  If anyone is threatening or hurting you, please get help. Physical or Emotional Violence is never OK.   Seborrheic Dermatitis, Adult Seborrheic dermatitis is a skin disease that causes red, scaly patches. It usually occurs on the scalp, and it is often called dandruff. The patches may appear on other parts of the body. Skin patches tend to appear where there are many oil glands in the skin. Areas of the body that are commonly affected include:  Scalp.  Skin folds of the body.  Ears.  Eyebrows.  Neck.  Face.  Armpits.  The bearded area of men's faces. The  condition may come and go for no known reason, and it is often long-lasting (chronic). What are the causes? The cause of this condition is not known. What increases the risk? This condition is more likely to develop in people who:  Have certain conditions, such as: ? HIV (human immunodeficiency virus). ? AIDS (acquired immunodeficiency syndrome). ? Parkinson disease. ? Mood disorders, such as depression.  Are 51-59 years old. What are the signs or symptoms? Symptoms of this condition include:  Thick scales on the scalp.  Redness on the face or in the armpits.  Skin that is flaky. The flakes may be white or yellow.  Skin that seems oily or dry but is not helped with moisturizers.  Itching or burning in the affected areas. How is this diagnosed? This condition is diagnosed with a medical history and physical exam. A sample of your skin may be tested (skin biopsy). You may need to see a skin specialist (dermatologist). How is this treated? There is no cure for this condition, but treatment can help to manage the symptoms. You may get treatment to remove scales, lower the risk of skin infection, and reduce swelling or itching. Treatment may include:  Creams that reduce swelling and irritation (steroids).  Creams that reduce skin yeast.  Medicated shampoo, soaps, moisturizing creams, or ointments.  Medicated moisturizing creams or ointments. Follow these instructions at home:  Apply over-the-counter and  prescription medicines only as told by your health care provider.  Use any medicated shampoo, soaps, skin creams, or ointments only as told by your health care provider.  Keep all follow-up visits as told by your health care provider. This is important. Contact a health care provider if:  Your symptoms do not improve with treatment.  Your symptoms get worse.  You have new symptoms. This information is not intended to replace advice given to you by your health care  provider. Make sure you discuss any questions you have with your health care provider. Document Revised: 09/02/2017 Document Reviewed: 01/08/2016 Elsevier Patient Education  2020 [employer].

## 2020-09-03 NOTE — Progress Notes (Signed)
Subjective  CC:  Chief Complaint  Patient presents with  . New Patient (Initial Visit)    previously seen with [facility]  . dry scalp    back of head    HPI: Charlotte Horn is a 59 y.o. female who presents to [facility] today to establish care with me as a new patient.  Reviewed multiple records including records from cardiology, lipid clinic, endocrinology and prior PCPs.  Reviewed prior lab results. She has the following concerns or needs:  Pleasant 59 year old married female, former patient of Dr. Ebbie Latus who recently retired.  She has multiple specialist for her multiple medical problems.  She is here for a wellness visit.  Health maintenance is up-to-date: She will have mammogram and Pap smear done this month at GYN.  She is due Pneumovax.  Flu shot is up-to-date.  Colorectal cancer screenings are up-to-date.  She has diabetes and is currently well controlled managed by endocrinology.  Recent eye exam is up-to-date and has no retinopathy.  She feels well.  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  There is a strong family history of early coronary artery disease.  She has failed multiple statins.  She is seeing the lipid clinic physician.  I reviewed those notes.  She has chronic anxiety and has been on sertraline since 2004.  This started with the diagnosis of her diabetes.  She continues to do well on sertraline.  She admits to racing mind especially at night, increase worry but does not have panic disorder or depression.  She needs refills.  She has insomnia mainly related to anxiety and worry.  Trazodone helps her sleep.  No adverse effects.  She needs refill.  She complains of itching and redness of the scalp line and lower scalp.  This is ongoing for years.  She is used Nizoral lotion with some relief.  She has never seen dermatology.  No papules or vesicles.  No pain.  Assessment  1. Annual physical exam   2. Mixed  hyperlipidemia   3. Family history of early CAD   4. 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 Seasonal allergic rhinitis due to pollen   6. GAD (generalized anxiety disorder)   7. Psychophysiological insomnia   8. Seborrheic dermatitis of scalp      Plan   Annual exam: Screens are up-to-date.  Healthy lifestyle.  Routine lab work ordered.  Routine counseling given.  Pneumovax updated today.  Diabetes per endocrinology.  Well-controlled.  Has peripheral neuropathy.  General anxiety disorder well-controlled on sertraline.  Refilled.  Insomnia well controlled on trazodone.  Refilled.  Chronic allergies and history of chronic sinusitis with asthma flares intermittently.  Albuterol and allergy medicines chronically.  Monitor.  Seborrheic dermatitis of scalp: Add triamcinolone lotion.  Add Selsun Blue shampoo.  Follow-up if needed.  See after visit summary.  Follow up: 6 months for recheck Orders Placed This Encounter  Procedures  . Pneumococcal polysaccharide vaccine 23-valent greater than or equal to 2yo subcutaneous/IM   Meds ordered this encounter  Medications  . metFORMIN (GLUCOPHAGE-XR) 500 MG 24 hr tablet    Sig: TAKE 1 TABLETS BY MOUTH TWO TIMES DAILY    Dispense:  360 tablet    Refill:  3  . sertraline (ZOLOFT) 100 MG tablet    Sig: Take 1 tablet (100 mg total) by mouth daily.    Dispense:  90 tablet    Refill:  3  .  traZODone (DESYREL) 100 MG tablet    Sig: Take 1 tablet (100 mg total) by mouth at bedtime.    Dispense:  90 tablet    Refill:  3  . Triamcinolone Acetonide 0.025 % LOTN    Sig: Apply to scalp nightly x 2 weeks as needed    Dispense:  60 mL    Refill:  2     No flowsheet data found.  We updated and reviewed the patient's past history in detail and it is documented below.  Patient Active Problem List   Diagnosis Date Noted  . GAD (generalized anxiety disorder) 09/03/2020  . Psychophysiological insomnia  09/03/2020  . Hypertension associated with diabetes (HCC) 08/28/2019  . Deviated septum 08/16/2018  . Nasal turbinate hypertrophy 08/16/2018  . Chronic sinusitis 08/16/2018  . Seasonal allergic rhinitis 08/16/2018  . 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 Hyperlipidemia 04/11/2017  . Family history of early CAD 04/11/2017  . ASTHMA, UNSPECIFIED, UNSPECIFIED STATUS 01/18/2008    Qualifier: Diagnosis of  By: Craige Cotta MD, [place]      Health Maintenance  Topic Date Due  . HEMOGLOBIN A1C  11/27/2020  . OPHTHALMOLOGY EXAM  08/20/2021  . FOOT EXAM  09/03/2021  . MAMMOGRAM  09/03/2021  . COLONOSCOPY  10/04/2022  . PAP SMEAR-Modifier  09/03/2024  . TETANUS/TDAP  09/04/2027  . INFLUENZA VACCINE  Completed  . PNEUMOCOCCAL POLYSACCHARIDE VACCINE AGE 17-64 HIGH RISK  Completed  . COVID-19 Vaccine  Completed  . Hepatitis C Screening  Completed  . HIV Screening  Discontinued   Immunization History  Administered Date(s) Administered  . Influenza,inj,Quad PF,6+ Mos 07/11/2018, 06/28/2019  . Influenza-Unspecified 07/03/2020  . PFIZER SARS-COV-2 Vaccination 12/06/2019, 12/27/2019, 07/03/2020  . Pneumococcal Conjugate-13 08/15/2016  . Pneumococcal Polysaccharide-23 09/03/2020  . Tdap 09/03/2017  . Zoster 01/11/2020  . Zoster Recombinat (Shingrix) 07/01/2019   Current Meds  Medication Sig  . Alpha-Lipoic Acid 100 MG CAPS Take 6 capsules by mouth 2 (two) times daily.  Marland Kitchen b complex vitamins capsule Take 1 capsule by mouth daily.  . benzonatate (TESSALON) 200 MG capsule Take 1 capsule by mouth daily as needed.   . cholecalciferol (VITAMIN D) 1000 units tablet Take 5,000 Units by mouth daily.  Marland Kitchen co-enzyme Q-10 50 MG capsule Take 100 mg by mouth daily.  . Continuous Blood Gluc Receiver (FREESTYLE LIBRE 14 DAY READER) DEVI 1 each by Does not apply route as directed.  . Continuous Blood Gluc Sensor (FREESTYLE LIBRE 14 DAY SENSOR) MISC USE DEVICE AS  DIRECTED  . Cranberry 125 MG TABS Take 1 tablet by mouth daily.  . cyclobenzaprine (FLEXERIL) 10 MG tablet Take 10 mg by mouth 3 (three) times daily as needed for muscle spasms.  . dapagliflozin propanediol (FARXIGA) 5 MG TABS tablet Take 1 tablet (5 mg total) by mouth daily before breakfast.  . Dulaglutide (TRULICITY) 3 MG/0.5ML SOPN Inject 3 mg into the skin once a week.  . fexofenadine (ALLEGRA) 180 MG tablet Take 1 tablet by mouth daily.  Marland Kitchen glipiZIDE (GLUCOTROL XL) 5 MG 24 hr tablet TAKE 1 TABLET(5 MG) BY MOUTH DAILY  . glucose blood (FREESTYLE TEST STRIPS) test strip Use once a day - for freestyle libre  . ipratropium (ATROVENT) 0.06 % nasal spray Place 1 spray into both nostrils.  Marland Kitchen losartan (COZAAR) 100 MG tablet TAKE 1 TABLET(100 MG) BY MOUTH DAILY  . Magnesium Oxide (MAG-200 PO) Take 1 tablet by mouth daily.  . metFORMIN (GLUCOPHAGE-XR) 500 MG  24 hr tablet TAKE 1 TABLETS BY MOUTH TWO TIMES DAILY  . methocarbamol (ROBAXIN) 500 MG tablet Take 500 mg by mouth every 8 (eight) hours as needed for muscle spasms.  . Multiple Vitamin (THERA) TABS Take 1 tablet by mouth daily.  . Omega-3 Fatty Acids (FISH OIL) 1000 MG CAPS Take 2 capsules by mouth daily.   Marland Kitchen. PROAIR HFA 108 (90 Base) MCG/ACT inhaler Inhale 2 puffs into the lungs as needed.  . sertraline (ZOLOFT) 100 MG tablet Take 1 tablet (100 mg total) by mouth daily.  . traZODone (DESYREL) 100 MG tablet Take 1 tablet (100 mg total) by mouth at bedtime.  . [DISCONTINUED] Evolocumab (REPATHA SURECLICK) 140 MG/ML SOAJ Inject 1 Dose into the skin every 14 (fourteen) days.  . [DISCONTINUED] metFORMIN (GLUCOPHAGE-XR) 500 MG 24 hr tablet TAKE 2 TABLETS BY MOUTH TWO TIMES DAILY (Patient taking differently: TAKE 1 TABLETS BY MOUTH TWO TIMES DAILY)  . [DISCONTINUED] sertraline (ZOLOFT) 100 MG tablet Take 1 tablet by mouth daily.  . [DISCONTINUED] traZODone (DESYREL) 100 MG tablet Take 1 tablet by mouth at bedtime.    Allergies: Patient is allergic  to statins and prednisone. Past Medical History Patient  has a past medical history of Allergy, Anxiety, Asthma, Diabetes mellitus without complication (HCC), Family history of early CAD (04/11/2017), Hyperlipidemia (04/11/2017), and Hypertension. Past Surgical History Patient  has a past surgical history that includes Cholecystectomy and Cesarean section. Family History: Patient family history includes Heart attack in her father; Heart failure in her paternal grandmother; Kidney failure in her father; Ovarian cancer in her maternal grandmother and mother; Parkinson's disease in her maternal grandfather; Sudden Cardiac Death in her brother. Social History:  Patient  reports that she has never smoked. She has never used smokeless tobacco. She reports current alcohol use. She reports that she does not use drugs.  Review of Systems: Constitutional: negative for fever or malaise Ophthalmic: negative for photophobia, double vision or loss of vision Cardiovascular: negative for chest pain, dyspnea on exertion, or new LE swelling Respiratory: negative for SOB or persistent cough Gastrointestinal: negative for abdominal pain, change in bowel habits or melena Genitourinary: negative for dysuria or gross hematuria Musculoskeletal: negative for new gait disturbance or muscular weakness Integumentary: negative for new or persistent rashes Neurological: negative for TIA or stroke symptoms Psychiatric: negative for SI or delusions Allergic/Immunologic: negative for hives  Patient Care Team    Relationship Specialty Notifications Start End  Willow OraAndy, Ariyona Eid L, MD PCP - General Family Medicine  09/03/20   Silverio Layivard, Sandra, MD Consulting Physician Obstetrics and Gynecology  09/03/20   Carlus PavlovGherghe, Cristina, MD Consulting Physician Endocrinology  09/03/20   Chrystie NoseHilty, Kenneth C, MD Consulting Physician Cardiology  09/03/20    Comment: lipids clinic  Chilton Siandolph, Tiffany, MD Consulting Physician Cardiology  09/03/20      Objective  Vitals: BP 122/70   Pulse 77   Temp (!) 97.3 F (36.3 C) (Temporal)   Ht 5\' 9"  (1.753 m)   Wt 143 lb 6.4 oz (65 kg)   SpO2 98%   BMI 21.18 kg/m  General:  Well developed, well nourished, no acute distress  Psych:  Alert and oriented,normal mood and affect HEENT:  Normocephalic, atraumatic, non-icteric sclera, supple neck without adenopathy, mass or thyromegaly Cardiovascular:  RRR without gallop, rub or murmur Respiratory:  Good breath sounds bilaterally, CTAB with normal respiratory effort Gastrointestinal: normal bowel sounds, soft, non-tender, no noted masses. No HSM MSK: no deformities, contusions. Joints are without erythema or swelling Skin:  Warm, no suspicious lesions noted, posterior scalp line with redness and minimal flaking.  No warmth or irritation or drainage. Neurologic:    Mental status is normal. Gross motor and sensory exams are normal. Normal gait  Diabetic Foot Exam: Appearance - no lesions, ulcers or calluses Skin - no sigificant pallor or erythema Monofilament testing - sensitive bilaterally in following locations:  Right - Great toe, medial, central, lateral ball and posterior foot intact  Left - Great toe, medial, central, lateral ball and posterior foot intact Pulses - +2 distally bilaterally    Commons side effects, risks, benefits, and alternatives for medications and treatment plan prescribed today were discussed, and the patient expressed understanding of the given instructions. Patient is instructed to call or message via MyChart if he/she has any questions or concerns regarding our treatment plan. No barriers to understanding were identified. We discussed Red Flag symptoms and signs in detail. Patient expressed understanding regarding what to do in case of urgent or emergency type symptoms.   Medication list was reconciled, printed and provided to the patient in AVS. Patient instructions and summary information was reviewed with the  patient as documented in the AVS. This note was prepared with assistance of Dragon voice recognition software. Occasional wrong-word or sound-a-like substitutions may have occurred due to the inherent limitations of voice recognition software  This visit occurred during the SARS-CoV-2 public health emergency.  Safety protocols were in place, including screening questions prior to the visit, additional usage of staff PPE, and extensive cleaning of exam room while observing appropriate contact time as indicated for disinfecting solutions.

## 2020-09-04 ENCOUNTER — Encounter: Payer: Self-pay | Admitting: Family Medicine

## 2020-09-09 NOTE — Telephone Encounter (Signed)
Spoke with [facility] rep and they stated labs were faxed on 09/02/20. Unable to locate nor have seen. They will re-fax to [phone]

## 2020-09-10 NOTE — Telephone Encounter (Signed)
Received lab results from genetic test - negative for FH, 4 variants tested.   Routed to MD for advice on medication(s) as Repatha has been denied

## 2020-09-11 NOTE — Telephone Encounter (Signed)
Dr. Rennis Golden would advise crestor 5mg  QOD or zetia 10mg  daily Will send patient a message

## 2020-09-11 NOTE — Telephone Encounter (Signed)
FH mutation not detected - does not completely rule-out FH.  Will need to consider other options.  Dr Rexene Edison

## 2020-09-18 ENCOUNTER — Other Ambulatory Visit: Payer: Self-pay | Admitting: Family Medicine

## 2020-09-19 LAB — CBC WITH DIFFERENTIAL/PLATELET
Basophils Absolute: 0 10*3/uL (ref 0.0–0.2)
Basos: 0 %
EOS (ABSOLUTE): 0.4 10*3/uL (ref 0.0–0.4)
Eos: 8 %
Hematocrit: 42.6 % (ref 34.0–46.6)
Hemoglobin: 13.5 g/dL (ref 11.1–15.9)
Immature Grans (Abs): 0 10*3/uL (ref 0.0–0.1)
Immature Granulocytes: 0 %
Lymphocytes Absolute: 1.7 10*3/uL (ref 0.7–3.1)
Lymphs: 36 %
MCH: 26.8 pg (ref 26.6–33.0)
MCHC: 31.7 g/dL (ref 31.5–35.7)
MCV: 85 fL (ref 79–97)
Monocytes Absolute: 0.3 10*3/uL (ref 0.1–0.9)
Monocytes: 6 %
Neutrophils Absolute: 2.4 10*3/uL (ref 1.4–7.0)
Neutrophils: 50 %
Platelets: 301 10*3/uL (ref 150–450)
RBC: 5.04 x10E6/uL (ref 3.77–5.28)
RDW: 13.1 % (ref 11.7–15.4)
WBC: 4.8 10*3/uL (ref 3.4–10.8)

## 2020-09-19 LAB — COMPREHENSIVE METABOLIC PANEL
ALT: 22 IU/L (ref 0–32)
AST: 20 IU/L (ref 0–40)
Albumin/Globulin Ratio: 2 (ref 1.2–2.2)
Albumin: 4.6 g/dL (ref 3.8–4.9)
Alkaline Phosphatase: 70 IU/L (ref 44–121)
BUN/Creatinine Ratio: 13 (ref 9–23)
BUN: 11 mg/dL (ref 6–24)
Bilirubin Total: 0.4 mg/dL (ref 0.0–1.2)
CO2: 25 mmol/L (ref 20–29)
Calcium: 9.3 mg/dL (ref 8.7–10.2)
Chloride: 100 mmol/L (ref 96–106)
Creatinine, Ser: 0.86 mg/dL (ref 0.57–1.00)
GFR calc Af Amer: 86 mL/min/{1.73_m2} (ref >59)
GFR calc non Af Amer: 74 mL/min/{1.73_m2} (ref >59)
Globulin, Total: 2.3 g/dL (ref 1.5–4.5)
Glucose: 127 mg/dL — ABNORMAL HIGH (ref 65–99)
Potassium: 4.2 mmol/L (ref 3.5–5.2)
Sodium: 138 mmol/L (ref 134–144)
Total Protein: 6.9 g/dL (ref 6.0–8.5)

## 2020-09-19 LAB — HEMOGLOBIN A1C
Est. average glucose Bld gHb Est-mCnc: 131 mg/dL
Hgb A1c MFr Bld: 6.2 % — ABNORMAL HIGH (ref 4.8–5.6)

## 2020-09-19 LAB — TSH: TSH: 3.69 u[IU]/mL (ref 0.450–4.500)

## 2020-09-23 ENCOUNTER — Encounter: Payer: Self-pay | Admitting: Internal Medicine

## 2020-09-23 ENCOUNTER — Telehealth (INDEPENDENT_AMBULATORY_CARE_PROVIDER_SITE_OTHER): Payer: Managed Care, Other (non HMO) | Admitting: Internal Medicine

## 2020-09-23 ENCOUNTER — Other Ambulatory Visit: Payer: Self-pay

## 2020-09-23 DIAGNOSIS — E7800 Pure hypercholesterolemia, unspecified: Secondary | ICD-10-CM

## 2020-09-23 DIAGNOSIS — E1142 Type 2 diabetes mellitus with diabetic polyneuropathy: Secondary | ICD-10-CM

## 2020-09-23 DIAGNOSIS — E1165 Type 2 diabetes mellitus with hyperglycemia: Secondary | ICD-10-CM | POA: Diagnosis not present

## 2020-09-23 MED ORDER — DAPAGLIFLOZIN PROPANEDIOL 10 MG PO TABS: 10.0000 mg | ORAL_TABLET | Freq: Every day | ORAL | 3 refills | Status: DC

## 2020-09-23 NOTE — Patient Instructions (Signed)
Please continue: - Metformin ER 500 mg 2x a day - Glipizide XL 5 mg before b'fast  - Trulicity 3 mg weekly  - Farxiga 5 mg before b'fast  Please return in 4 months with your sugar log.

## 2020-09-23 NOTE — Progress Notes (Signed)
Patient ID: Charlotte DupesKathleen George Mccranie, female   DOB: November 05, 1960, 59 y.o.   MRN: [id number]   Patient location: Home My location: Office Persons participating in the virtual visit: patient, provider  Referring Provider: Willow OraAndy, Camille L, MD  I connected with the patient on 09/23/20 at  8:37am EST by a video enabled telemedicine application and verified that I am speaking with the correct person.   I discussed the limitations of evaluation and management by telemedicine and the availability of in person appointments. The patient expressed understanding and agreed to proceed.   Details of the encounter are shown below.  HPI: Charlotte Horn is a 59 y.o.-year-old female,  returning for follow-up for DM2, dx in 2004, non-insulin-dependent, uncontrolled, without long-term complications.  Last visit 4 months ago.  We performed this visit virtually since she has an upper respiratory infection.  She is working from home.  At the beginning of the year, she changed her diet and started on Optivia diet with improvement of her blood sugars and weight.  Reviewed HbA1c levels: Lab Results  Component Value Date   HGBA1C 6.2 (H) 09/18/2020   HGBA1C 6.4 (A) 05/27/2020   HGBA1C 6.3 (A) 01/07/2020  02/15/2018: HbA1c 7.0% 05/12/2017: HbA1c 9.1% 02/01/2017: HbA1c 7.0%  She is on: - Metformin ER 1000 >> 500 mg 2x a day - Glipizide XL 5 mg before b'fast  - Farxiga 5 mg in am-added 05/2020 - Trulicity 1.5 >> 3 mg weekly  She tried Byetta, Bydureon (1 year) - but had diarrhea. She had very low CBGs with regular Glipizide.  Also, she had hot flashes from the glipizide especially after taking a hot shower.  She checks her sugars more than 4 times a day with her freestyle libre CGM:  Freestyle libre CGM parameters: - Average: 125 >> 123 >> 139 - % active CGM time: 89% >> 95% >> 83% of the time - Glucose variability 32% >> 34.3% >> 30% (target < or = to 36%) - CMI: 6.3% - time in range:  - very  low (<54): 0% >> 1% >> 0% - low (54-69): 3% >> 6% >> 0% - normal range (70-180): 87% >> 84% >> 83% - high sugars (181-250): 10% >> 8% >> 16% - very high sugars (>250): 0% >> 1% >> 1%    Previously:   Prev:   Lowest sugar was 45 (lowest ever) ... 50s >> 1380s; she has hypoglycemia awareness in the 60s. Highest sugar was 337 >>...285 (hamburger + icecream) >> 270s.  Glucometer: One Touch Ultra mini  Pt's meals are: - Breakfast: b'fast bar (<15g carbs), almonds, cereal >> yoghurt + granola, nuts or skips - Lunch:  veggies, popcorn - Dinner: meat + veggies + rarely starch Diet sodas usually lower her sugars.  No CKD Lab Results  Component Value Date   BUN 11 09/18/2020   CREATININE 0.86 09/18/2020  08/28/2019: CMP with slightly elevated glucose at 115, BUN/creatinine 10/0.61, GFR 101, ACR <7 02/01/2017: Glucose 185, BUN/creatinine 14/0.73, GFR 93. On losartan.  She has hyperlipidemia Lab Results  Component Value Date   CHOL 250 (H) 08/08/2020   HDL 64 08/08/2020   LDLCALC 171 (H) 08/08/2020   TRIG 89 08/08/2020   CHOLHDL 3.9 08/08/2020  Previously:  08/28/2019: 240/159/57/154 02/01/2017: 253/166/59/161  On co-Q10 and fish oil and Nexletol PA was started by the lipid clinic >> denied.  Also, a PCSK9 inhibitor was denied by her insurance.  - last eye exam was in 08/2020: No DR reportedly. Dr. Hazle Quantigby.  -  She has numbness and tingling in her feet.  She is on B complex Bempedoic acid.  On ASA 81.  Pt has FH of DM in father, PGM, 2 brothers, nephews  She works at [employer].  ROS: Constitutional: no weight gain/no weight loss, no fatigue, no subjective hyperthermia, no subjective hypothermia Eyes: no blurry vision, no xerophthalmia ENT: no sore throat, no nodules palpated in neck, no dysphagia, no odynophagia, + cough, + nasal congestion Cardiovascular: no CP/no SOB/no palpitations/no leg swelling Respiratory: no cough/no SOB/no wheezing Gastrointestinal: no N/no V/no  D/no C/no acid reflux Musculoskeletal: no muscle aches/no joint aches Skin: no rashes, no hair loss Neurological: no tremors/+ numbness/+ tingling/no dizziness  I reviewed pt's medications, allergies, PMH, social hx, family hx, and changes were documented in the history of present illness. Otherwise, unchanged from my initial visit note.  Past Medical History:  Diagnosis Date  . Allergy   . Anxiety   . Asthma   . Diabetes mellitus without complication (HCC)   . Family history of early CAD 04/11/2017  . Hyperlipidemia 04/11/2017  . Hypertension    PSx H: - multiple eye Sx's: [id number] - C-section 35 - Gall Bladder Sx 1984  Social History   Social History  . Marital status: Married    Spouse name: N/A  . Number of children: 1   Occupational History  . Reimbursement analyst   Social History Main Topics  . Smoking status: Never Smoker  . Smokeless tobacco: Never Used  . Alcohol use No  . Drug use: No   Current Outpatient Medications on File Prior to Visit  Medication Sig Dispense Refill  . Alpha-Lipoic Acid 100 MG CAPS Take 6 capsules by mouth 2 (two) times daily.    Marland Kitchen aspirin EC 81 MG tablet Take 81 mg by mouth daily. (Patient not taking: Reported on 09/03/2020)    . b complex vitamins capsule Take 1 capsule by mouth daily.    . benzonatate (TESSALON) 200 MG capsule Take 1 capsule by mouth daily as needed.   0  . cholecalciferol (VITAMIN D) 1000 units tablet Take 5,000 Units by mouth daily.    Marland Kitchen co-enzyme Q-10 50 MG capsule Take 100 mg by mouth daily.    . Continuous Blood Gluc Receiver (FREESTYLE LIBRE 14 DAY READER) DEVI 1 each by Does not apply route as directed. 1 Device 0  . Continuous Blood Gluc Sensor (FREESTYLE LIBRE 14 DAY SENSOR) MISC USE DEVICE AS DIRECTED 2 each 17  . Cranberry 125 MG TABS Take 1 tablet by mouth daily.    . cyclobenzaprine (FLEXERIL) 10 MG tablet Take 10 mg by mouth 3 (three) times daily as needed for muscle spasms.    . dapagliflozin  propanediol (FARXIGA) 5 MG TABS tablet Take 1 tablet (5 mg total) by mouth daily before breakfast. 90 tablet 3  . Dulaglutide (TRULICITY) 3 MG/0.5ML SOPN Inject 3 mg into the skin once a week. 12 pen 3  . fexofenadine (ALLEGRA) 180 MG tablet Take 1 tablet by mouth daily.    Marland Kitchen glipiZIDE (GLUCOTROL XL) 5 MG 24 hr tablet TAKE 1 TABLET(5 MG) BY MOUTH DAILY 90 tablet 3  . glucose blood (FREESTYLE TEST STRIPS) test strip Use once a day - for freestyle libre 100 each 3  . ipratropium (ATROVENT) 0.06 % nasal spray Place 1 spray into both nostrils.    Marland Kitchen losartan (COZAAR) 100 MG tablet TAKE 1 TABLET(100 MG) BY MOUTH DAILY 90 tablet 3  . Magnesium Oxide (MAG-200 PO) Take 1  tablet by mouth daily.    . metFORMIN (GLUCOPHAGE-XR) 500 MG 24 hr tablet TAKE 1 TABLETS BY MOUTH TWO TIMES DAILY 360 tablet 3  . methocarbamol (ROBAXIN) 500 MG tablet Take 500 mg by mouth every 8 (eight) hours as needed for muscle spasms.    . Multiple Vitamin (THERA) TABS Take 1 tablet by mouth daily.    . Omega-3 Fatty Acids (FISH OIL) 1000 MG CAPS Take 2 capsules by mouth daily.     Marland Kitchen PROAIR HFA 108 (90 Base) MCG/ACT inhaler Inhale 2 puffs into the lungs as needed.  0  . sertraline (ZOLOFT) 100 MG tablet Take 1 tablet (100 mg total) by mouth daily. 90 tablet 3  . traZODone (DESYREL) 100 MG tablet Take 1 tablet (100 mg total) by mouth at bedtime. 90 tablet 3  . Triamcinolone Acetonide 0.025 % LOTN Apply to scalp nightly x 2 weeks as needed 60 mL 2   No current facility-administered medications on file prior to visit.   Allergies  Allergen Reactions  . Statins Other (See Comments)    MUSCLE ACHES  . Prednisone    Family History  Problem Relation Age of Onset  . Ovarian cancer Mother   . Arthritis Mother   . Cancer Mother   . Depression Mother   . Heart attack Father   . Kidney failure Father   . Heart disease Father   . Hyperlipidemia Father   . Kidney disease Father   . Diabetes Father   . Sudden Cardiac Death Brother    . Alcohol abuse Brother   . Asthma Brother   . Diabetes Brother   . Early death Brother   . Heart disease Brother   . Hyperlipidemia Brother   . Hypertension Brother   . Heart attack Brother   . Ovarian cancer Maternal Grandmother   . Arthritis Maternal Grandmother   . Cancer Maternal Grandmother   . COPD Maternal Grandmother   . Parkinson's disease Maternal Grandfather   . Heart failure Paternal Grandmother   . Diabetes Brother   . Heart disease Brother   . Hyperlipidemia Brother   . Breast cancer Neg Hx     PE: There were no vitals taken for this visit.  Wt Readings from Last 3 Encounters:  09/03/20 143 lb 6.4 oz (65 kg)  08/12/20 144 lb (65.3 kg)  05/27/20 144 lb (65.3 kg)   Constitutional:  in NAD  The physical exam was not performed (virtual visit).  ASSESSMENT: 1. 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 HL  PLAN:  1. Patient with longstanding, previously uncontrolled type 2 diabetes, currently improved on a regimen of Metformin, sulfonylurea, weekly GLP-1 receptor agonist and now also SGLT2 inhibitor added at last visit.  We have her on the low-dose of Metformin as she had diarrhea with higher doses.  At last visit, reviewing her CGM download, it appears that sugars were increasing after lunch and less after dinner.  After 9 PM, sugars were decreasing with a nadir at 12 AM, after which they started to increase, but still in the normal range.  We discussed about changing breakfast, which was high glycemic index with granola and we also added [place] but otherwise we continued the rest of the regimen. CGM interpretation: -At today's visit, we reviewed her CGM downloads: It appears that 83%% of values are in target range (goal >70%), while 17% are higher than 180 (goal <25%), and 0% are lower than 70 (goal <  4%).  The calculated average blood sugar is 139, slightly higher than at last visit.  The projected HbA1c for the  next 3 months (GMI) is 6.6%. -Reviewing the CGM trends, her sugars are excellently controlled overnight, not very fluctuating, will decrease morning after breakfast and they stay elevated up until dinnertime, when they decreased to target.  The highest sugars are after lunch.  -Upon questioning the sugars are still increase even without increase actually at breakfast time even without her eating breakfast.  We discussed that this is possibly related to stress hormones: Epinephrine, cortisol -For now, I suggested to increase the dose of Farxiga, states she is tolerating well the lower dose.  I strongly advised her to get hydrated especially in the setting of her URI now - I suggested to:  Patient Instructions  Please continue: - Metformin ER 500 mg 2x a day - Glipizide XL 5 mg before b'fast  - Trulicity 3 mg weekly   Please increase: - Farxiga 10 mg before b'fast  Please return in 4 months with your sugar log.   - advised to check sugars at different times of the day - 4x a day, rotating check times - advised for yearly eye exams >> she is UTD - return to clinic in 4 months  2. 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 HL -Reviewed latest lipid panel from 08/2020: LDL very high, the rest of the fractions at goal Lab Results  Component Value Date   CHOL 250 (H) 08/08/2020   HDL 64 08/08/2020   LDLCALC 171 (H) 08/08/2020   TRIG 89 08/08/2020   CHOLHDL 3.9 08/08/2020  -Continues fish oil without side effects.  She had muscle pain and bone pain from statins and muscle pain from Zetia. PA for several cholesterol medications were denied.  An appeal may be sent with information about her brother dying at 73 from massive heart attack and his father dying in his 91s from heart disease.  She is now seen the lipid clinic.  PCSK9 inhibitor was not covered for her.  I did advise her to go get tested for Covid19.  Carlus Pavlov, MD PhD [facility]

## 2020-10-02 ENCOUNTER — Other Ambulatory Visit: Payer: Self-pay | Admitting: Internal Medicine

## 2020-10-04 ENCOUNTER — Other Ambulatory Visit: Payer: Self-pay | Admitting: Internal Medicine

## 2020-10-16 ENCOUNTER — Encounter: Payer: Self-pay | Admitting: Cardiovascular Disease

## 2020-10-16 ENCOUNTER — Telehealth (INDEPENDENT_AMBULATORY_CARE_PROVIDER_SITE_OTHER): Payer: Managed Care, Other (non HMO) | Admitting: Cardiovascular Disease

## 2020-10-16 VITALS — HR 79 | Ht 69.0 in | Wt 140.0 lb

## 2020-10-16 DIAGNOSIS — E1159 Type 2 diabetes mellitus with other circulatory complications: Secondary | ICD-10-CM

## 2020-10-16 DIAGNOSIS — E782 Mixed hyperlipidemia: Secondary | ICD-10-CM | POA: Diagnosis not present

## 2020-10-16 DIAGNOSIS — I152 Hypertension secondary to endocrine disorders: Secondary | ICD-10-CM

## 2020-10-16 NOTE — Patient Instructions (Signed)
Medication Instructions:  Continue current medications  *If you need a refill on your cardiac medications before your next appointment, please call your pharmacy*   Lab Work: None Ordered   Testing/Procedures: None Ordered   Follow-Up: At [facility], you and your health needs are our priority.  As part of our continuing mission to provide you with exceptional heart care, we have created designated Provider Care Teams.  These Care Teams include your primary Cardiologist (physician) and Advanced Practice Providers (APPs -  Physician Assistants and Nurse Practitioners) who all work together to provide you with the care you need, when you need it.  We recommend signing up for the patient portal called "MyChart".  Sign up information is provided on this After Visit Summary.  MyChart is used to connect with patients for Virtual Visits (Telemedicine).  Patients are able to view lab/test results, encounter notes, upcoming appointments, etc.  Non-urgent messages can be sent to your provider as well.   To learn more about what you can do with MyChart, go to [email or web address].    Your next appointment:   1 year(s)  The format for your next appointment:   In Person  Provider:   You may see Tiffany South Dennis, MD or one of the following Advanced Practice Providers on your designated Care Team:    Luke Kilroy, PA-C  Callie Goodrich, PA-C  Jesse Cleaver, FNP    

## 2020-10-16 NOTE — Progress Notes (Unsigned)
Virtual Visit via Telephone Note   This visit type was conducted due to national recommendations for restrictions regarding the COVID-19 Pandemic (e.g. social distancing) in an effort to limit this patient's exposure and mitigate transmission in our community.  Due to her co-morbid illnesses, this patient is at least at moderate risk for complications without adequate follow up.  This format is felt to be most appropriate for this patient at this time.  The patient did not have access to video technology/had technical difficulties with video requiring transitioning to audio format only (telephone).  All issues noted in this document were discussed and addressed.  No physical exam could be performed with this format.  Please refer to the patient's chart for her  consent to telehealth for [facility].   The patient was identified using 2 identifiers.  Date:  10/17/2020   ID:  Charlotte Horn, DOB 1961/01/11, MRN [id number]  Patient Location: Home Provider Location: Home Office  PCP:  Willow Ora, MD  Cardiologist:  Chilton Si, MD  Electrophysiologist:  None   Evaluation Performed:  Follow-Up Visit  Chief Complaint:  hyperlipidemia  History of Present Illness:     The patient does not have symptoms concerning for COVID-19 infection (fever, chills, cough, or new shortness of breath).                Charlotte Horn is a 60 y.o. female with diabetes, hypertension, asthma and hyperlipidemia who presents for follow up.  She was initially seen 04/2017 for an evaluation of CAD risk.  Charlotte Horn last saw Dr. Constance Goltz 04/05/17 and was referred to cardiology due to hyperlipidemia with statin intolerance.  She has tried simvastatin, atorvastatin, rosuvastatin, pravastatin, and pitavastatin and Horn unable to tolerate them due to myalgias.  She was started on Zetia but had muscle aches with this as well.  Her LDL did decrease from 1 61-1 05 on Zetia.  In the  past she has also taken niacin and WelChol.  She did not tolerate niacin and WelChol did not affect her cholesterol.  She has has a strong family history of CAD.  Her father had an MI at age 43 and her brother died of cardiac arrest.  Charlotte Horn was referred for a coronary calcium score 06/28/17 that showed a score of 0.  Since her last appointment Ms. Kretschmer has Horn doing well.  She has Horn trying to work on her diet and she and her husband are limiting fried/fatty foods.  She has also Horn trying to walk for exercise.  She has no exertional chest pain or shortness of breath.  She denies lower extremity edema, orthopnea or PND.   She was referred to Dr. Rennis Golden due to statin and Zetia intolerance.  She did not qualify for a statin due to lack of known ASCVD.  She was referred for genetic testing.  She started taking Zetia 5mg  every other day.  She just started it this week and thus far is tolerating it.  She has Horn watching her diet and her hemoglobin A1c is improving.  She is trying to get more exercise and has two dogs so she is walking more.  She has no exertional chest pain or shortness of breath.  She denies lower extremity edema, orthopnea or PND.    Past Medical History:  Diagnosis Date  . Allergy   . Anxiety   . Asthma   . Diabetes mellitus without complication (HCC)   . Family history of early  CAD 04/11/2017  . Hyperlipidemia 04/11/2017  . Hypertension     Past Surgical History:  Procedure Laterality Date  . CESAREAN SECTION    . CHOLECYSTECTOMY       Current Outpatient Medications  Medication Sig Dispense Refill  . Alpha-Lipoic Acid 100 MG CAPS Take 6 capsules by mouth 2 (two) times daily.    Marland Kitchen b complex vitamins capsule Take 1 capsule by mouth daily.    . benzonatate (TESSALON) 200 MG capsule Take 1 capsule by mouth daily as needed.   0  . cholecalciferol (VITAMIN D) 1000 units tablet Take 5,000 Units by mouth daily.    Marland Kitchen co-enzyme Q-10 50 MG capsule Take 100 mg by mouth  daily.    . Continuous Blood Gluc Receiver (FREESTYLE LIBRE 14 DAY READER) DEVI 1 each by Does not apply route as directed. 1 Device 0  . Continuous Blood Gluc Sensor (FREESTYLE LIBRE 14 DAY SENSOR) MISC Place 1 each onto the skin every 14 (fourteen) days. 6 each 3  . Cranberry 125 MG TABS Take 1 tablet by mouth daily.    . dapagliflozin propanediol (FARXIGA) 10 MG TABS tablet Take 1 tablet (10 mg total) by mouth daily before breakfast. 90 tablet 3  . ezetimibe (ZETIA) 10 MG tablet Take 10 mg by mouth every other day.    . fexofenadine (ALLEGRA) 180 MG tablet Take 1 tablet by mouth daily.    Marland Kitchen glipiZIDE (GLUCOTROL XL) 5 MG 24 hr tablet TAKE 1 TABLET(5 MG) BY MOUTH DAILY 90 tablet 3  . glucose blood (FREESTYLE TEST STRIPS) test strip Use once a day - for freestyle libre 100 each 3  . ipratropium (ATROVENT) 0.06 % nasal spray Place 1 spray into both nostrils.    Marland Kitchen losartan (COZAAR) 100 MG tablet TAKE 1 TABLET(100 MG) BY MOUTH DAILY 90 tablet 3  . Magnesium Oxide (MAG-200 PO) Take 1 tablet by mouth daily.    . metFORMIN (GLUCOPHAGE-XR) 500 MG 24 hr tablet TAKE 1 TABLETS BY MOUTH TWO TIMES DAILY 360 tablet 3  . Multiple Vitamin (THERA) TABS Take 1 tablet by mouth daily.    . Omega-3 Fatty Acids (FISH OIL) 1000 MG CAPS Take 2 capsules by mouth daily.     Marland Kitchen PROAIR HFA 108 (90 Base) MCG/ACT inhaler Inhale 2 puffs into the lungs as needed.  0  . sertraline (ZOLOFT) 100 MG tablet Take 1 tablet (100 mg total) by mouth daily. 90 tablet 3  . traZODone (DESYREL) 100 MG tablet Take 1 tablet (100 mg total) by mouth at bedtime. 90 tablet 3  . Triamcinolone Acetonide 0.025 % LOTN Apply to scalp nightly x 2 weeks as needed 60 mL 2  . TRULICITY 3 MG/0.5ML SOPN INJECT 3MG  INTO THE SKIN ONCE WEEKLY 6 mL 2   No current facility-administered medications for this visit.    Allergies:   Statins and Prednisone    Social History:  The patient  reports that she has never smoked. She has never used smokeless tobacco.  She reports current alcohol use. She reports that she does not use drugs.   Family History:  The patient's family history includes Alcohol abuse in her brother; Arthritis in her maternal grandmother and mother; Asthma in her brother; COPD in her maternal grandmother; Cancer in her maternal grandmother and mother; Depression in her mother; Diabetes in her brother, brother, and father; Early death in her brother; Heart attack in her brother and father; Heart disease in her brother, brother, and father; Heart failure  in her paternal grandmother; Hyperlipidemia in her brother, brother, and father; Hypertension in her brother; Kidney disease in her father; Kidney failure in her father; Ovarian cancer in her maternal grandmother and mother; Parkinson's disease in her maternal grandfather; Sudden Cardiac Death in her brother.    ROS:  Please see the history of present illness.   Otherwise, review of systems are positive for none.   All other systems are reviewed and negative.    PHYSICAL EXAM: Pulse 79   Ht 5\' 9"  (1.753 m)   Wt 140 lb (63.5 kg)   BMI 20.67 kg/m  GENERAL: Sounds well.  No acute distress. RESP: Respirations unlabored NEURO:  Speech fluent.   PSYCH:  Cognitively intact, oriented to person place and time   EKG:  EKG is not ordered today. The ekg ordered 04/11/17 demonstrates sinus rhythm.  Rate 67 bpm.  First degree AV block 09/07/19: Sinus rhythm.  Rate 75 bpm.  First degree AV block.    Recent Labs: 09/18/2020: ALT 22; BUN 11; Creatinine, Ser 0.86; Hemoglobin 13.5; Platelets 301; Potassium 4.2; Sodium 138; TSH 3.690   02/01/17:  Sodium 141, potassium 4.8, BUN 14, creatinine 0.73 AST 16, ALT 17 Total cholesterol 253, triglycerides 166, HDL 59, LDL 161 Hemoglobin A1c 7.0% TSH 4.31 WBC 4.7, hemoglobin 11.5, hematocrit 34.9, platelets 248   Lipid Panel    Component Value Date/Time   CHOL 250 (H) 08/08/2020 0855   TRIG 89 08/08/2020 0855   HDL 64 08/08/2020 0855   CHOLHDL 3.9  08/08/2020 0855   LDLCALC 171 (H) 08/08/2020 0855      Wt Readings from Last 3 Encounters:  10/16/20 140 lb (63.5 kg)  09/03/20 143 lb 6.4 oz (65 kg)  08/12/20 144 lb (65.3 kg)      ASSESSMENT AND PLAN:  # Hyperlipidemia: # Family history of CAD: Ms. Alredge's LDL improved significantly on Zetia.  However she did not tolerate it due to myalgias.  She has tried several statins and niacin.  Welchol was not effective.  S she worked with our lipid specialist but unfortunately was not able to qualify for Repatha.  Her genetic testing is not available for me to see.  She reports that it was negative but only for variants were checked.  For now she just started taking Zetia 5 mg every other day and seems to be tolerating it thus far.  If this works for a month then she will increase it to daily.  # CV Risk prevention: Continue aspirin 81mg  daily.  Continue exercise.  #Hypertension: Continue losartan.  Current medicines are reviewed at length with the patient today.  The patient does not have concerns regarding medicines.  The following changes have Horn made:  None  Labs/ tests ordered today include:   No orders of the defined types were placed in this encounter.  COVID-19 Education: The signs and symptoms of COVID-19 were discussed with the patient and how to seek care for testing (follow up with PCP or arrange E-visit).  The importance of social distancing was discussed today.  Time:   Today, I have spent 17 minutes with the patient with telehealth technology discussing the above problems.   Disposition:   FU with Cory Rama C. 13/09/21, MD, [facility] in 1 year.   This note was written with the assistance of speech recognition software.  Please excuse any transcriptional errors.  Signed, Lorene Samaan C. Duke Salvia, MD, [facility]  10/17/2020 8:14 AM    [facility]

## 2020-12-12 ENCOUNTER — Other Ambulatory Visit: Payer: Self-pay | Admitting: Internal Medicine

## 2020-12-17 ENCOUNTER — Other Ambulatory Visit: Payer: Self-pay | Admitting: Internal Medicine

## 2021-01-13 ENCOUNTER — Other Ambulatory Visit: Payer: Self-pay | Admitting: Internal Medicine

## 2021-01-18 ENCOUNTER — Other Ambulatory Visit: Payer: Self-pay | Admitting: Internal Medicine

## 2021-01-27 ENCOUNTER — Other Ambulatory Visit: Payer: Self-pay | Admitting: *Deleted

## 2021-01-27 ENCOUNTER — Encounter: Payer: Self-pay | Admitting: Internal Medicine

## 2021-01-27 ENCOUNTER — Ambulatory Visit (INDEPENDENT_AMBULATORY_CARE_PROVIDER_SITE_OTHER): Payer: Managed Care, Other (non HMO) | Admitting: Internal Medicine

## 2021-01-27 ENCOUNTER — Other Ambulatory Visit: Payer: Self-pay

## 2021-01-27 VITALS — BP 120/82 | HR 85 | Ht 69.0 in | Wt 142.2 lb

## 2021-01-27 DIAGNOSIS — E7800 Pure hypercholesterolemia, unspecified: Secondary | ICD-10-CM

## 2021-01-27 DIAGNOSIS — E1165 Type 2 diabetes mellitus with hyperglycemia: Secondary | ICD-10-CM | POA: Diagnosis not present

## 2021-01-27 DIAGNOSIS — E1142 Type 2 diabetes mellitus with diabetic polyneuropathy: Secondary | ICD-10-CM

## 2021-01-27 LAB — POCT GLYCOSYLATED HEMOGLOBIN (HGB A1C): Hemoglobin A1C: 6.4 % — AB (ref 4.0–5.6)

## 2021-01-27 MED ORDER — METFORMIN HCL ER 500 MG PO TB24: ORAL_TABLET | ORAL | 0 refills | Status: DC

## 2021-01-27 NOTE — Progress Notes (Signed)
Patient ID: Charlotte Horn, female   DOB: 06/08/1961, 60 y.o.   MRN: [id number]   This visit occurred during the SARS-CoV-2 public health emergency.  Safety protocols were in place, including screening questions prior to the visit, additional usage of staff PPE, and extensive cleaning of exam room while observing appropriate contact time as indicated for disinfecting solutions.   HPI: Charlotte Horn is a 60 y.o.-year-old female,  returning for follow-up for DM2, dx in 2004, non-insulin-dependent, uncontrolled, without long-term complications.  Last visit 4 months ago.  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 She has more stress at work as a Radio broadcast assistant friend is sick. At the beginning of last year, she changed her diet and started [employer] along with her husband, with improvement of her blood sugars and weight.  Not on this anymore. No increased urination or blurry vision.  Reviewed HbA1c levels: Lab Results  Component Value Date   HGBA1C 6.2 (H) 09/18/2020   HGBA1C 6.4 (A) 05/27/2020   HGBA1C 6.3 (A) 01/07/2020  02/15/2018: HbA1c 7.0% 05/12/2017: HbA1c 9.1% 02/01/2017: HbA1c 7.0%  She is on: - Metformin ER 1000 >> 500 mg 2x a day - Glipizide XL 5 mg before b'fast  - Farxiga 5 mg in am-added 05/2020 >> 10 mg daily - Trulicity 1.5 >> 3 mg weekly  She tried Byetta, Bydureon (1 year) - but had diarrhea. She had very low CBGs with regular Glipizide.  Also, she had hot flashes from the glipizide especially after taking a hot shower.  She checks her sugars more than 4 times a day with her freestyle libre CGM:   Previously:   Lowest sugar was 45 (lowest ever) ... 50s >> 80s >> 24; she has hypoglycemia awareness in the 60s. Highest sugar was 337 >>...285 (hamburger + icecream) >> 270s >> 270.  Glucometer: One Touch Ultra mini  Pt's meals are: - Breakfast: b'fast bar (<15g carbs), almonds, cereal >>  yoghurt + granola, nuts or skips - Lunch:  veggies, popcorn - Dinner: meat + veggies + rarely starch Diet sodas usually lower her sugars.  No CKD: Lab Results  Component Value Date   BUN 11 09/18/2020   CREATININE 0.86 09/18/2020  08/28/2019: CMP with slightly elevated glucose at 115, BUN/creatinine 10/0.61, GFR 101, ACR <7 02/01/2017: Glucose 185, BUN/creatinine 14/0.73, GFR 93. On losartan.  She has hyperlipidemia: Lab Results  Component Value Date   CHOL 250 (H) 08/08/2020   HDL 64 08/08/2020   LDLCALC 171 (H) 08/08/2020   TRIG 89 08/08/2020   CHOLHDL 3.9 08/08/2020  Previously:  08/28/2019: 240/159/57/154 02/01/2017: 253/166/59/161  On co-Q10 and fish oil. Nexletol PA was started by the lipid clinic >> denied.  Also, a PCSK9 inhibitor was denied by her insurance. She is now seen in the lipid clinic.  - last eye exam was in 08/2020: No DR reportedly. Dr. Hazle Quant.  -She has numbness and tingling in her feet.  She is on B complex and alpha lipoic acid.  On ASA 81.  Pt has FH of DM in father, PGM, 2 brothers, nephews  She works at [employer].  ROS: Constitutional: no weight gain/no weight loss, no fatigue, no subjective hyperthermia, no subjective hypothermia Eyes: no blurry vision, no xerophthalmia ENT: no sore throat, no nodules palpated in neck, no dysphagia, no odynophagia Cardiovascular: no CP/no SOB/no palpitations/no leg swelling Respiratory: no cough/no SOB/no wheezing Gastrointestinal: no N/no V/no D/no C/no acid reflux  Musculoskeletal: no muscle aches/no joint aches Skin: no rashes, no hair loss Neurological: no tremors/+ numbness/+ tingling/no dizziness  I reviewed pt's medications, allergies, PMH, social hx, family hx, and changes were documented in the history of present illness. Otherwise, unchanged from my initial visit note.  Past Medical History:  Diagnosis Date  . Allergy   . Anxiety   . Asthma   . Diabetes mellitus without complication (HCC)    . Family history of early CAD 04/11/2017  . Hyperlipidemia 04/11/2017  . Hypertension    PSx H: - multiple eye Sx's: [id number] - C-section 141996 - Gall Bladder Sx 1984  Social History   Social History  . Marital status: Married    Spouse name: N/A  . Number of children: 1   Occupational History  . Reimbursement analyst   Social History Main Topics  . Smoking status: Never Smoker  . Smokeless tobacco: Never Used  . Alcohol use No  . Drug use: No   Current Outpatient Medications on File Prior to Visit  Medication Sig Dispense Refill  . Alpha-Lipoic Acid 100 MG CAPS Take 6 capsules by mouth 2 (two) times daily.    Marland Kitchen. b complex vitamins capsule Take 1 capsule by mouth daily.    . benzonatate (TESSALON) 200 MG capsule Take 1 capsule by mouth daily as needed.   0  . cholecalciferol (VITAMIN D) 1000 units tablet Take 5,000 Units by mouth daily.    Marland Kitchen. co-enzyme Q-10 50 MG capsule Take 100 mg by mouth daily.    . Continuous Blood Gluc Receiver (FREESTYLE LIBRE 14 DAY READER) DEVI 1 each by Does not apply route as directed. 1 Device 0  . Continuous Blood Gluc Sensor (FREESTYLE LIBRE 14 DAY SENSOR) MISC Place 1 each onto the skin every 14 (fourteen) days. 6 each 3  . Cranberry 125 MG TABS Take 1 tablet by mouth daily.    . dapagliflozin propanediol (FARXIGA) 10 MG TABS tablet Take 1 tablet (10 mg total) by mouth daily before breakfast. 90 tablet 3  . ezetimibe (ZETIA) 10 MG tablet Take 10 mg by mouth every other day.    . fexofenadine (ALLEGRA) 180 MG tablet Take 1 tablet by mouth daily.    Marland Kitchen. glipiZIDE (GLUCOTROL XL) 5 MG 24 hr tablet TAKE 1 TABLET(5 MG) BY MOUTH DAILY 90 tablet 3  . glipiZIDE (GLUCOTROL) 5 MG tablet TAKE 1 TABLET(5 MG) BY MOUTH TWICE DAILY BEFORE A MEAL 180 tablet 0  . glucose blood (FREESTYLE TEST STRIPS) test strip Use once a day - for freestyle libre 100 each 3  . ipratropium (ATROVENT) 0.06 % nasal spray Place 1 spray into both nostrils.    Marland Kitchen. losartan (COZAAR) 100 MG  tablet TAKE 1 TABLET(100 MG) BY MOUTH DAILY 90 tablet 3  . Magnesium Oxide (MAG-200 PO) Take 1 tablet by mouth daily.    . metFORMIN (GLUCOPHAGE-XR) 500 MG 24 hr tablet TAKE 2 TABLETS BY MOUTH TWICE DAILY 60 tablet 0  . Multiple Vitamin (THERA) TABS Take 1 tablet by mouth daily.    . Omega-3 Fatty Acids (FISH OIL) 1000 MG CAPS Take 2 capsules by mouth daily.     Marland Kitchen. PROAIR HFA 108 (90 Base) MCG/ACT inhaler Inhale 2 puffs into the lungs as needed.  0  . sertraline (ZOLOFT) 100 MG tablet Take 1 tablet (100 mg total) by mouth daily. 90 tablet 3  . traZODone (DESYREL) 100 MG tablet Take 1 tablet (100 mg total) by mouth at bedtime. 90 tablet 3  .  Triamcinolone Acetonide 0.025 % LOTN Apply to scalp nightly x 2 weeks as needed 60 mL 2  . TRULICITY 3 MG/0.5ML SOPN INJECT 3MG  INTO THE SKIN ONCE WEEKLY 6 mL 2   No current facility-administered medications on file prior to visit.   Allergies  Allergen Reactions  . Statins Other (See Comments)    MUSCLE ACHES  . Prednisone    Family History  Problem Relation Age of Onset  . Ovarian cancer Mother   . Arthritis Mother   . Cancer Mother   . Depression Mother   . Heart attack Father   . Kidney failure Father   . Heart disease Father   . Hyperlipidemia Father   . Kidney disease Father   . Diabetes Father   . Sudden Cardiac Death Brother   . Alcohol abuse Brother   . Asthma Brother   . Diabetes Brother   . Early death Brother   . Heart disease Brother   . Hyperlipidemia Brother   . Hypertension Brother   . Heart attack Brother   . Ovarian cancer Maternal Grandmother   . Arthritis Maternal Grandmother   . Cancer Maternal Grandmother   . COPD Maternal Grandmother   . Parkinson's disease Maternal Grandfather   . Heart failure Paternal Grandmother   . Diabetes Brother   . Heart disease Brother   . Hyperlipidemia Brother   . Breast cancer Neg Hx     PE: BP 120/82 (BP Location: Right Arm, Patient Position: Sitting, Cuff Size: Normal)    Pulse 85   Ht 5\' 9"  (1.753 m)   Wt 142 lb 3.2 oz (64.5 kg)   SpO2 98%   BMI 21.00 kg/m   Wt Readings from Last 3 Encounters:  01/27/21 142 lb 3.2 oz (64.5 kg)  10/16/20 140 lb (63.5 kg)  09/03/20 143 lb 6.4 oz (65 kg)   Constitutional: normal weight, in NAD Eyes: PERRLA, EOMI, no exophthalmos ENT: moist mucous membranes, no thyromegaly, no cervical lymphadenopathy Cardiovascular: RRR, No MRG Respiratory: CTA B Gastrointestinal: abdomen soft, NT, ND, BS+ Musculoskeletal: no deformities, strength intact in all 4 Skin: moist, warm, no rashes Neurological: no tremor with outstretched hands, DTR normal in all 4  ASSESSMENT: 1. 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 HL  PLAN:  1. Patient with longstanding, previously uncontrolled type 2 diabetes, improved on the regimen of metformin ER (low dose due to diarrhea), sulfonylurea, SGLT2 inhibitor (which was increased at last visit) and also weekly GLP-1 receptor agonist.  At last visit, per review of her CGM downloads, sugars were very well controlled overnight but they would increase after breakfast and stay elevated until dinnertime, when they started to decrease to target.  Highest sugars were after lunch.  We increased Farxiga at that time and continued the rest of the regimen. CGM interpretation: -At today's visit, we reviewed her CGM downloads: It appears that 81% of values are in target range (goal >70%), while 60% are higher than 180 (goal <25%), and 3% are lower than 70 (goal <4%).  The calculated average blood sugar is 137.  The projected HbA1c for the next 3 months (GMI) is 6.6%. -Reviewing the CGM trends, it appears that her sugars are low normal overnight (with occasional blood sugars in the 50s).  During the day, sugars increases and if she wakes up even without eating anything and they remain high and they are the highest after lunch.  She is eating pasta for lunch, but  is doing  a good job in keeping this as healthy as possible.  Sugars are dropping after dinner. -At this point, due to her low blood sugars overnight and the fact that she is not on insulin, will need to stop her glipizide XL.  I advised her to use glipizide instant release before breakfast and that she may need to add 2.5 to 5 mg before lunch if the sugars remain elevated after this meal.  Otherwise, we can continue the current regimen. - I suggested to:  Patient Instructions  Please continue: - Metformin ER 500 mg 2x a day - Farxiga 10 mg before b'fast - Trulicity 3 mg weekly   Please change: - Glipizide 5 mg before b'fast (may add 2.5-5 mg before)  Look up the "Portfolio diet".  Please return in 4 months.   - we checked her HbA1c: 6.4% (slightly higher) - advised to check sugars at different times of the day - 4x a day, rotating check times - advised for yearly eye exams >> she is UTD - return to clinic in 4 months  2. 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 HL -Reviewed latest lipid panel from 08/2020: LDL very high, the rest of the fractions at goal: Lab Results  Component Value Date   CHOL 250 (H) 08/08/2020   HDL 64 08/08/2020   LDLCALC 171 (H) 08/08/2020   TRIG 89 08/08/2020   CHOLHDL 3.9 08/08/2020  -Continues visual without side effects.  She had muscle pains and bone pains from statins and muscle pains from Zetia even at a low dose, 5 mg daily -She is seen in the lipid clinic. PA for several cholesterol medications were denied.  An appeal may be sent with information about her brother dying at 69 from massive heart attack and his father dying in his 34s from heart disease.  A PCSK9 inhibitor was not covered for her.   -At this point, she is not on any cholesterol medications.  I suggested to look up the "portfolio diet".  Carlus Pavlov, MD PhD [facility] Endocrinology

## 2021-01-27 NOTE — Patient Instructions (Addendum)
Please continue: - Metformin ER 500 mg 2x a day - Farxiga 10 mg before b'fast - Trulicity 3 mg weekly   Please change: - Glipizide 5 mg before b'fast (may add 2.5-5 mg before)  Look up the "Portfolio diet".  Please return in 4 months.

## 2021-02-02 ENCOUNTER — Other Ambulatory Visit: Payer: Self-pay | Admitting: Internal Medicine

## 2021-02-02 DIAGNOSIS — E1165 Type 2 diabetes mellitus with hyperglycemia: Secondary | ICD-10-CM

## 2021-03-04 ENCOUNTER — Other Ambulatory Visit: Payer: Self-pay

## 2021-03-04 ENCOUNTER — Encounter: Payer: Self-pay | Admitting: Family Medicine

## 2021-03-04 ENCOUNTER — Ambulatory Visit: Payer: Managed Care, Other (non HMO) | Admitting: Family Medicine

## 2021-03-04 VITALS — BP 120/78 | HR 87 | Temp 98.7°F | Ht 69.0 in | Wt 148.4 lb

## 2021-03-04 DIAGNOSIS — F411 Generalized anxiety disorder: Secondary | ICD-10-CM | POA: Diagnosis not present

## 2021-03-04 DIAGNOSIS — Z23 Encounter for immunization: Secondary | ICD-10-CM | POA: Diagnosis not present

## 2021-03-04 DIAGNOSIS — E1159 Type 2 diabetes mellitus with other circulatory complications: Secondary | ICD-10-CM

## 2021-03-04 DIAGNOSIS — I152 Hypertension secondary to endocrine disorders: Secondary | ICD-10-CM

## 2021-03-04 DIAGNOSIS — E1142 Type 2 diabetes mellitus with diabetic polyneuropathy: Secondary | ICD-10-CM | POA: Diagnosis not present

## 2021-03-04 DIAGNOSIS — E782 Mixed hyperlipidemia: Secondary | ICD-10-CM

## 2021-03-04 DIAGNOSIS — F5104 Psychophysiologic insomnia: Secondary | ICD-10-CM

## 2021-03-04 NOTE — Patient Instructions (Signed)
Please return in 6 months for your annual complete physical; please come fasting.  Today you were given your 2nd of 2 Shingrix vaccinations.   If you have any questions or concerns, please don't hesitate to send me a message via MyChart or call the office at [phone]. Thank you for visiting with [place] today! It's our pleasure caring for you.

## 2021-03-04 NOTE — Progress Notes (Signed)
Subjective  CC:  Chief Complaint  Patient presents with  . Hyperlipidemia  . Diabetes  . Anxiety  . Dermatitis    HPI: Charlotte Horn is a 60 y.o. female who presents to the office today for follow up of diabetes and problems listed above in the chief complaint.  60 year old with multiple chronic medical problems managed with multiple specialist.  I reviewed notes from December.  This is a 29-month follow-up.  Diabetes follow up: Her diabetic control is reported as controlled.  I reviewed recent endocrinology notes.  They are tweaking medications.  She is working on making her diet ideal. denies exertional CP or SOB or symptomatic hypoglycemia. She denies foot sores but has chronic mild paresthesias .   Hyperlipidemia managed by cardiology.  Strong family history of premature cardiovascular death and familial hyperlipidemia.  Unfortunately she cannot tolerate statins or Zetia due to myalgias.  Working on diet.  Blood pressure remains well controlled.  Anxiety disorder and insomnia are well controlled on sertraline and trazodone.  No adverse effects noted.  Health maintenance: She is eligible for second Shingrix vaccination. Wt Readings from Last 3 Encounters:  03/04/21 148 lb 6.4 oz (67.3 kg)  01/27/21 142 lb 3.2 oz (64.5 kg)  10/16/20 140 lb (63.5 kg)    BP Readings from Last 3 Encounters:  03/04/21 120/78  01/27/21 120/82  09/03/20 122/70    Assessment  1. Well controlled type 2 diabetes mellitus with peripheral neuropathy (HCC)   2. Hypertension associated with diabetes (HCC)   3. Mixed hyperlipidemia   4. GAD (generalized anxiety disorder)   5. Psychophysiological insomnia      Plan   Diabetes is currently well controlled.  She will continue to work with endocrinology.  Blood pressures well controlled  Hyperlipidemia: Diet management due to statin intolerance  Anxiety and insomnia well-controlled continue trazodone and sertraline  Second Shingrix  dose given.   Follow up: Marland Kitchen Follow-up 6 months for complete physical.  And will be once annually Orders Placed This Encounter  Procedures  . Varicella-zoster vaccine IM (Shingrix)   No orders of the defined types were placed in this encounter.     Immunization History  Administered Date(s) Administered  . Influenza,inj,Quad PF,6+ Mos 07/11/2018, 06/28/2019  . Influenza-Unspecified 07/03/2020  . PFIZER Comirnaty(Gray Top)Covid-19 Tri-Sucrose Vaccine 02/04/2021  . PFIZER(Purple Top)SARS-COV-2 Vaccination 12/06/2019, 12/27/2019, 07/03/2020  . Pneumococcal Conjugate-13 08/15/2016  . Pneumococcal Polysaccharide-23 09/03/2020  . Tdap 09/03/2017  . Zoster Recombinat (Shingrix) 07/01/2019, 03/04/2021  . Zoster, Live 06/04/2009    Diabetes Related Lab Review: Lab Results  Component Value Date   HGBA1C 6.4 (A) 01/27/2021   HGBA1C 6.2 (H) 09/18/2020   HGBA1C 6.4 (A) 05/27/2020    No results found for: Concepcion [place] Lab Results  Component Value Date   CREATININE 0.86 09/18/2020   BUN 11 09/18/2020   NA 138 09/18/2020   K 4.2 09/18/2020   CL 100 09/18/2020   CO2 25 09/18/2020   Lab Results  Component Value Date   CHOL 250 (H) 08/08/2020   CHOL 227 (H) 04/24/2020   CHOL 205 (H) 10/29/2019   Lab Results  Component Value Date   HDL 64 08/08/2020   HDL 64 04/24/2020   HDL 60 10/29/2019   Lab Results  Component Value Date   LDLCALC 171 (H) 08/08/2020   LDLCALC 145 (H) 04/24/2020   LDLCALC 119 (H) 10/29/2019   Lab Results  Component Value Date   TRIG 89 08/08/2020   TRIG 100  04/24/2020   TRIG 151 (H) 10/29/2019   Lab Results  Component Value Date   CHOLHDL 3.9 08/08/2020   CHOLHDL 3.5 04/24/2020   CHOLHDL 3.4 10/29/2019   No results found for: LDLDIRECT The 10-year ASCVD risk score Denman George DC Jr., et al., 2013) is: 5.4%   Values used to calculate the score:     Age: 33 years     Sex: Female     Is Non-Hispanic African American: No     Diabetic: Yes      Tobacco smoker: No     Systolic Blood Pressure: 120 mmHg     Is BP treated: No     HDL Cholesterol: 64 mg/dL     Total Cholesterol: 250 mg/dL I have reviewed the PMH, Fam and Soc history. Patient Active Problem List   Diagnosis Date Noted  . GAD (generalized anxiety disorder) 09/03/2020  . Psychophysiological insomnia 09/03/2020  . Hypertension associated with diabetes (HCC) 08/28/2019  . Deviated septum 08/16/2018  . Nasal turbinate hypertrophy 08/16/2018  . Chronic sinusitis 08/16/2018  . Seasonal allergic rhinitis 08/16/2018  . 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 Hyperlipidemia 04/11/2017  . Family history of early CAD 04/11/2017  . ASTHMA, UNSPECIFIED, UNSPECIFIED STATUS 01/18/2008    Qualifier: Diagnosis of  By: Craige Cotta MD, [place]       Social History: Patient  reports that she has never smoked. She has never used smokeless tobacco. She reports current alcohol use. She reports that she does not use drugs.  Review of Systems: Ophthalmic: negative for eye pain, loss of vision or double vision Cardiovascular: negative for chest pain Respiratory: negative for SOB or persistent cough Gastrointestinal: negative for abdominal pain Genitourinary: negative for dysuria or gross hematuria MSK: negative for foot lesions Neurologic: negative for weakness or gait disturbance  Objective  Vitals: BP 120/78   Pulse 87   Temp 98.7 F (37.1 C) (Temporal)   Ht 5\' 9"  (1.753 m)   Wt 148 lb 6.4 oz (67.3 kg)   SpO2 97%   BMI 21.91 kg/m  General: well appearing, no acute distress  Psych:  Alert and oriented, normal mood and affect HEENT:  Normocephalic, atraumatic, moist mucous membranes, supple neck  Cardiovascular:  Nl S1 and S2, RRR without murmur, gallop or rub. no edema Respiratory:  Good breath sounds bilaterally, CTAB with normal effort, no rales   Diabetic education: ongoing education regarding chronic disease management for  diabetes was given today. We continue to reinforce the ABC's of diabetic management: A1c (<7 or 8 dependent upon patient), tight blood pressure control, and cholesterol management with goal LDL < 100 minimally. We discuss diet strategies, exercise recommendations, medication options and possible side effects. At each visit, we review recommended immunizations and preventive care recommendations for diabetics and stress that good diabetic control can prevent other problems. See below for this patient's data.    Commons side effects, risks, benefits, and alternatives for medications and treatment plan prescribed today were discussed, and the patient expressed understanding of the given instructions. Patient is instructed to call or message via MyChart if he/she has any questions or concerns regarding our treatment plan. No barriers to understanding were identified. We discussed Red Flag symptoms and signs in detail. Patient expressed understanding regarding what to do in case of urgent or emergency type symptoms.   Medication list was reconciled, printed and provided to the patient in AVS. Patient instructions and summary information was reviewed with the patient  as documented in the AVS. This note was prepared with assistance of Dragon voice recognition software. Occasional wrong-word or sound-a-like substitutions may have occurred due to the inherent limitations of voice recognition software  This visit occurred during the SARS-CoV-2 public health emergency.  Safety protocols were in place, including screening questions prior to the visit, additional usage of staff PPE, and extensive cleaning of exam room while observing appropriate contact time as indicated for disinfecting solutions.

## 2021-04-29 ENCOUNTER — Other Ambulatory Visit: Payer: Self-pay

## 2021-04-30 ENCOUNTER — Ambulatory Visit (INDEPENDENT_AMBULATORY_CARE_PROVIDER_SITE_OTHER): Payer: Managed Care, Other (non HMO) | Admitting: Physician Assistant

## 2021-04-30 ENCOUNTER — Encounter: Payer: Self-pay | Admitting: Physician Assistant

## 2021-04-30 VITALS — BP 115/73 | HR 82 | Temp 98.2°F | Ht 69.0 in | Wt 148.2 lb

## 2021-04-30 DIAGNOSIS — R2230 Localized swelling, mass and lump, unspecified upper limb: Secondary | ICD-10-CM | POA: Diagnosis not present

## 2021-04-30 DIAGNOSIS — J301 Allergic rhinitis due to pollen: Secondary | ICD-10-CM

## 2021-04-30 MED ORDER — MONTELUKAST SODIUM 10 MG PO TABS: 10.0000 mg | ORAL_TABLET | Freq: Every day | ORAL | 2 refills | Status: DC

## 2021-04-30 MED ORDER — MONTELUKAST SODIUM 10 MG PO TABS: 10.0000 mg | ORAL_TABLET | Freq: Every day | ORAL | 1 refills | Status: DC

## 2021-04-30 NOTE — Progress Notes (Signed)
Acute Office Visit  Subjective:    Patient ID: Charlotte Horn, female    DOB: Jul 27, 1961, 60 y.o.   MRN: [id number]  Chief Complaint  Patient presents with   Cyst    Both hands   Allergy    HPI Patient is in today for bilateral hand ?cysts. Right hand dominant.  She works on the computer most of the day.  She has noticed 1 cyst in the middle of her right hand that popped up in the last 2 to 3 months.  Her left hand has 2 knots on her palm that seem to be causing more issues as she types or with his hand.  It is very tender. No treatments tried yet.   Also wants to try something different for allergies. She takes Allegra 180 mg every morning. If she takes it at night, it keeps her awake. She does have She has more coughing, sneezing, congestion in the last year. Also has two new dogs. Known cat allergy. States that Claritin and Zyrtec did not work as well for her.   Past Medical History:  Diagnosis Date   Allergy    Anxiety    Asthma    Diabetes mellitus without complication (HCC)    Family history of early CAD 04/11/2017   Hyperlipidemia 04/11/2017   Hypertension     Past Surgical History:  Procedure Laterality Date   CESAREAN SECTION     CHOLECYSTECTOMY      Family History  Problem Relation Age of Onset   Ovarian cancer Mother    Arthritis Mother    Cancer Mother    Depression Mother    Heart attack Father    Kidney failure Father    Heart disease Father    Hyperlipidemia Father    Kidney disease Father    Diabetes Father    Sudden Cardiac Death Brother    Alcohol abuse Brother    Asthma Brother    Diabetes Brother    Early death Brother    Heart disease Brother    Hyperlipidemia Brother    Hypertension Brother    Heart attack Brother    Ovarian cancer Maternal Grandmother    Arthritis Maternal Grandmother    Cancer Maternal Grandmother    COPD Maternal Grandmother    Parkinson's disease Maternal Grandfather    Heart failure Paternal Grandmother     Diabetes Brother    Heart disease Brother    Hyperlipidemia Brother    Breast cancer Neg Hx     Social History   Socioeconomic History   Marital status: Married    Spouse name: Not on file   Number of children: Not on file   Years of education: Not on file   Highest education level: Not on file  Occupational History    Employer: [employer]  Tobacco Use   Smoking status: Never   Smokeless tobacco: Never  Substance and Sexual Activity   Alcohol use: Yes   Drug use: Never   Sexual activity: Yes    Birth control/protection: Post-menopausal  Other Topics Concern   Not on file  Social History Narrative   Not on file   Social Determinants of Health   Financial Resource Strain: Not on file  Food Insecurity: Not on file  Transportation Needs: Not on file  Physical Activity: Not on file  Stress: Not on file  Social Connections: Not on file  Intimate Partner Violence: Not on file    Outpatient Medications Prior to Visit  Medication Sig Dispense Refill   Alpha-Lipoic Acid 100 MG CAPS Take 6 capsules by mouth 2 (two) times daily.     b complex vitamins capsule Take 1 capsule by mouth daily.     cholecalciferol (VITAMIN D) 1000 units tablet Take 5,000 Units by mouth daily.     co-enzyme Q-10 50 MG capsule Take 100 mg by mouth daily.     Continuous Blood Gluc Receiver (FREESTYLE LIBRE 14 DAY READER) DEVI 1 each by Does not apply route as directed. 1 Device 0   Continuous Blood Gluc Sensor (FREESTYLE LIBRE 14 DAY SENSOR) MISC Place 1 each onto the skin every 14 (fourteen) days. 6 each 3   Cranberry 125 MG TABS Take 1 tablet by mouth daily.     dapagliflozin propanediol (FARXIGA) 10 MG TABS tablet Take 1 tablet (10 mg total) by mouth daily before breakfast. 90 tablet 3   fexofenadine (ALLEGRA) 180 MG tablet Take 1 tablet by mouth daily.     glipiZIDE (GLUCOTROL XL) 5 MG 24 hr tablet TAKE 1 TABLET(5 MG) BY MOUTH DAILY 90 tablet 3   glucose blood (FREESTYLE TEST STRIPS) test strip  Use once a day - for freestyle libre 100 each 3   ipratropium (ATROVENT) 0.06 % nasal spray Place 1 spray into both nostrils.     losartan (COZAAR) 100 MG tablet TAKE 1 TABLET(100 MG) BY MOUTH DAILY 90 tablet 3   Magnesium Oxide (MAG-200 PO) Take 1 tablet by mouth daily.     metFORMIN (GLUCOPHAGE-XR) 500 MG 24 hr tablet TAKE 2 TABLETS BY MOUTH TWICE DAILY 180 tablet 2   Multiple Vitamin (THERA) TABS Take 1 tablet by mouth daily.     Omega-3 Fatty Acids (FISH OIL) 1000 MG CAPS Take 2 capsules by mouth daily.      PROAIR HFA 108 (90 Base) MCG/ACT inhaler Inhale 2 puffs into the lungs as needed.  0   sertraline (ZOLOFT) 100 MG tablet Take 1 tablet (100 mg total) by mouth daily. 90 tablet 3   traZODone (DESYREL) 100 MG tablet Take 1 tablet (100 mg total) by mouth at bedtime. 90 tablet 3   Triamcinolone Acetonide 0.025 % LOTN Apply to scalp nightly x 2 weeks as needed 60 mL 2   TRULICITY 3 MG/0.5ML SOPN INJECT 3MG  INTO THE SKIN ONCE WEEKLY 6 mL 2   No facility-administered medications prior to visit.    Allergies  Allergen Reactions   Statins Other (See Comments)    MUSCLE ACHES   Prednisone     Review of Systems REFER TO HPI FOR PERTINENT POSITIVES AND NEGATIVES     Objective:    Physical Exam Vitals and nursing note reviewed.  Constitutional:      Appearance: Normal appearance. She is normal weight.  HENT:     Head: Normocephalic.     Right Ear: External ear normal.     Left Ear: External ear normal.     Nose: Congestion present.  Cardiovascular:     Rate and Rhythm: Normal rate and regular rhythm.     Pulses: Normal pulses.     Heart sounds: Normal heart sounds.  Pulmonary:     Effort: Pulmonary effort is normal.     Breath sounds: Normal breath sounds.  Musculoskeletal:       Hands:     Comments: Normal ROM in hands and fingers, N/V intact, good capillary refill, no trigger fingers   Neurological:     Mental Status: She is alert.  BP 115/73   Pulse 82   Temp  98.2 F (36.8 C)   Ht 5\' 9"  (1.753 m)   Wt 148 lb 3.2 oz (67.2 kg)   SpO2 96%   BMI 21.89 kg/m  Wt Readings from Last 3 Encounters:  04/30/21 148 lb 3.2 oz (67.2 kg)  03/04/21 148 lb 6.4 oz (67.3 kg)  01/27/21 142 lb 3.2 oz (64.5 kg)    There are no preventive care reminders to display for this patient.  There are no preventive care reminders to display for this patient.   Lab Results  Component Value Date   TSH 3.690 09/18/2020   Lab Results  Component Value Date   WBC 4.8 09/18/2020   HGB 13.5 09/18/2020   HCT 42.6 09/18/2020   MCV 85 09/18/2020   PLT 301 09/18/2020   Lab Results  Component Value Date   NA 138 09/18/2020   K 4.2 09/18/2020   CO2 25 09/18/2020   GLUCOSE 127 (H) 09/18/2020   BUN 11 09/18/2020   CREATININE 0.86 09/18/2020   BILITOT 0.4 09/18/2020   ALKPHOS 70 09/18/2020   AST 20 09/18/2020   ALT 22 09/18/2020   PROT 6.9 09/18/2020   ALBUMIN 4.6 09/18/2020   CALCIUM 9.3 09/18/2020   Lab Results  Component Value Date   CHOL 250 (H) 08/08/2020   Lab Results  Component Value Date   HDL 64 08/08/2020   Lab Results  Component Value Date   LDLCALC 171 (H) 08/08/2020   Lab Results  Component Value Date   TRIG 89 08/08/2020   Lab Results  Component Value Date   CHOLHDL 3.9 08/08/2020   Lab Results  Component Value Date   HGBA1C 6.4 (A) 01/27/2021       Assessment & Plan:   Problem List Items Addressed This Visit       Respiratory   Seasonal allergic rhinitis   Other Visit Diagnoses     Subcutaneous nodule of hand    -  Primary        Meds ordered this encounter  Medications   montelukast (SINGULAIR) 10 MG tablet    Sig: Take 1 tablet (10 mg total) by mouth at bedtime.    Dispense:  30 tablet    Refill:  2   montelukast (SINGULAIR) 10 MG tablet    Sig: Take 1 tablet (10 mg total) by mouth at bedtime.    Dispense:  90 tablet    Refill:  1   1. Subcutaneous nodule of hand Probably 2/2 working on laptop all  day Diclofenac (Voltaren) gel three times daily. Aleve twice daily with food for 10 days. Ice to area as needed. She will call back for referral to sports med for injection if worse or no improvement.   2. Seasonal allergic rhinitis due to pollen She will continue on Allegra and add Singulair 10 mg. Use nasal saline. Referral to allergist if worse or no improvement.    Kelleigh Skerritt M Lakin Rhine, PA-C

## 2021-04-30 NOTE — Patient Instructions (Addendum)
Diclofenac (Voltaren) gel three times daily. Aleve twice daily with food for 10 days. You may also use ice packs for about 10 minutes three times per day.  If worse or no improvement in the next few weeks, consider steroid injection.   Singulair sent in to start taking for your allergies. If worse or no improvement, we can refer you to an allergist.

## 2021-06-01 ENCOUNTER — Ambulatory Visit: Payer: Managed Care, Other (non HMO) | Admitting: Internal Medicine

## 2021-06-01 ENCOUNTER — Other Ambulatory Visit: Payer: Self-pay

## 2021-06-01 ENCOUNTER — Encounter: Payer: Self-pay | Admitting: Internal Medicine

## 2021-06-01 VITALS — BP 120/78 | HR 82 | Ht 69.0 in | Wt 149.0 lb

## 2021-06-01 DIAGNOSIS — E7800 Pure hypercholesterolemia, unspecified: Secondary | ICD-10-CM | POA: Diagnosis not present

## 2021-06-01 DIAGNOSIS — E1142 Type 2 diabetes mellitus with diabetic polyneuropathy: Secondary | ICD-10-CM

## 2021-06-01 DIAGNOSIS — E1165 Type 2 diabetes mellitus with hyperglycemia: Secondary | ICD-10-CM

## 2021-06-01 LAB — POCT GLYCOSYLATED HEMOGLOBIN (HGB A1C): Hemoglobin A1C: 6.1 % — AB (ref 4.0–5.6)

## 2021-06-01 MED ORDER — LOSARTAN POTASSIUM 100 MG PO TABS: ORAL_TABLET | ORAL | 3 refills | Status: DC

## 2021-06-01 NOTE — Progress Notes (Signed)
Patient ID: Veeda Virgo, female   DOB: 11/12/1960, 60 y.o.   MRN: [id number]   This visit occurred during the SARS-CoV-2 public health emergency.  Safety protocols were in place, including screening questions prior to the visit, additional usage of staff PPE, and extensive cleaning of exam room while observing appropriate contact time as indicated for disinfecting solutions.   HPI: Diann Bangerter is a 60 y.o.-year-old female,  returning for follow-up for DM2, dx in 2004, non-insulin-dependent, uncontrolled, without long-term complications.  Last visit 4 months ago.  Interim history: She continues to work from home (her company will not go back in person). No increased urination, blurry vision, nausea, chest pain. She has some pressure in her right ear.  Reviewed HbA1c levels: Lab Results  Component Value Date   HGBA1C 6.4 (A) 01/27/2021   HGBA1C 6.2 (H) 09/18/2020   HGBA1C 6.4 (A) 05/27/2020  02/15/2018: HbA1c 7.0% 05/12/2017: HbA1c 9.1% 02/01/2017: HbA1c 7.0%  She is on: - Metformin ER 1000 >> 500 mg 2x a day (decreased 2/2 stomach pbs) - Glipizide XL 5 mg before b'fast >> glipizide IR 5 mg in a.m. and 2.5-5 before lunch if needed >> back to Glipizide XL 5 - Farxiga 5 mg in am-added 05/2020 >> 10 mg daily - Trulicity 1.5 >> 3 mg weekly  She tried Byetta, Bydureon (1 year) - but had diarrhea. She had very low CBGs with regular Glipizide.  Also, she had hot flashes from the glipizide especially after taking a hot shower.  She checks her sugars more than 4 times a day with her freestyle libre CGM:   Previously:   Previously:   Lowest sugar was 45 (lowest ever) ...  >> 53 >> 53 on CGM (inaccurate per rer report)88; she has hypoglycemia awareness in the 60s. Highest sugar was 337... >> 270 >> 350 (eating out).  Glucometer: One Touch Ultra mini  Pt's meals are: - Breakfast: b'fast bar (<15g carbs), almonds, cereal >> yoghurt + granola, nuts or skips -  Lunch:  veggies, popcorn - Dinner: meat + veggies + rarely starch Diet sodas usually lower her sugars.  No CKD: Lab Results  Component Value Date   BUN 11 09/18/2020   CREATININE 0.86 09/18/2020  08/28/2019: CMP with slightly elevated glucose at 115, BUN/creatinine 10/0.61, GFR 101, ACR <7 02/01/2017: Glucose 185, BUN/creatinine 14/0.73, GFR 93. On losartan.  She has hyperlipidemia: Lab Results  Component Value Date   CHOL 250 (H) 08/08/2020   HDL 64 08/08/2020   LDLCALC 171 (H) 08/08/2020   TRIG 89 08/08/2020   CHOLHDL 3.9 08/08/2020  Previously:  08/28/2019: 240/159/57/154 02/01/2017: 253/166/59/161  On co-Q10 and fish oil. Nexletol PA was started by the lipid clinic >> denied.  Also, a PCSK9 inhibitor was denied by her insurance. She is now seen in the lipid clinic.  - last eye exam was in 08/2020: No DR reportedly. Dr. Hazle Quant.  -She has numbness and tingling in her feet.  She is on B complex and alpha lipoic acid.  On ASA 81.  Pt has FH of DM in father, PGM, 2 brothers, nephews  She works at [employer].  ROS: Constitutional: no weight gain/no weight loss, no fatigue, no subjective hyperthermia, no subjective hypothermia Eyes: no blurry vision, no xerophthalmia ENT: no sore throat, no nodules palpated in neck, no dysphagia, no odynophagia Cardiovascular: no CP/no SOB/no palpitations/no leg swelling Respiratory: no cough/no SOB/no wheezing Gastrointestinal: no N/no V/no D/no C/no acid reflux Musculoskeletal: no muscle aches/no joint aches Skin: no  rashes, no hair loss Neurological: no tremors/+ numbness/+ tingling/no dizziness  I reviewed pt's medications, allergies, PMH, social hx, family hx, and changes were documented in the history of present illness. Otherwise, unchanged from my initial visit note.  Past Medical History:  Diagnosis Date   Allergy    Anxiety    Asthma    Diabetes mellitus without complication (HCC)    Family history of early CAD 04/11/2017    Hyperlipidemia 04/11/2017   Hypertension    PSx H: - multiple eye Sx's: 5110-2111 - C-section 1996 - Gall Bladder Sx 1984  Social History   Social History   Marital status: Married    Spouse name: N/A   Number of children: 1   Occupational History   Glass blower/designer   Social History Main Topics   Smoking status: Never Smoker   Smokeless tobacco: Never Used   Alcohol use No   Drug use: No   Current Outpatient Medications on File Prior to Visit  Medication Sig Dispense Refill   Alpha-Lipoic Acid 100 MG CAPS Take 6 capsules by mouth 2 (two) times daily.     b complex vitamins capsule Take 1 capsule by mouth daily.     cholecalciferol (VITAMIN D) 1000 units tablet Take 5,000 Units by mouth daily.     co-enzyme Q-10 50 MG capsule Take 100 mg by mouth daily.     Continuous Blood Gluc Receiver (FREESTYLE LIBRE 14 DAY READER) DEVI 1 each by Does not apply route as directed. 1 Device 0   Continuous Blood Gluc Sensor (FREESTYLE LIBRE 14 DAY SENSOR) MISC Place 1 each onto the skin every 14 (fourteen) days. 6 each 3   Cranberry 125 MG TABS Take 1 tablet by mouth daily.     dapagliflozin propanediol (FARXIGA) 10 MG TABS tablet Take 1 tablet (10 mg total) by mouth daily before breakfast. 90 tablet 3   fexofenadine (ALLEGRA) 180 MG tablet Take 1 tablet by mouth daily.     glipiZIDE (GLUCOTROL XL) 5 MG 24 hr tablet TAKE 1 TABLET(5 MG) BY MOUTH DAILY 90 tablet 3   glucose blood (FREESTYLE TEST STRIPS) test strip Use once a day - for freestyle libre 100 each 3   ipratropium (ATROVENT) 0.06 % nasal spray Place 1 spray into both nostrils.     losartan (COZAAR) 100 MG tablet TAKE 1 TABLET(100 MG) BY MOUTH DAILY 90 tablet 3   Magnesium Oxide (MAG-200 PO) Take 1 tablet by mouth daily.     metFORMIN (GLUCOPHAGE-XR) 500 MG 24 hr tablet TAKE 2 TABLETS BY MOUTH TWICE DAILY 180 tablet 2   montelukast (SINGULAIR) 10 MG tablet Take 1 tablet (10 mg total) by mouth at bedtime. 30 tablet 2   montelukast  (SINGULAIR) 10 MG tablet Take 1 tablet (10 mg total) by mouth at bedtime. 90 tablet 1   Multiple Vitamin (THERA) TABS Take 1 tablet by mouth daily.     Omega-3 Fatty Acids (FISH OIL) 1000 MG CAPS Take 2 capsules by mouth daily.      PROAIR HFA 108 (90 Base) MCG/ACT inhaler Inhale 2 puffs into the lungs as needed.  0   sertraline (ZOLOFT) 100 MG tablet Take 1 tablet (100 mg total) by mouth daily. 90 tablet 3   traZODone (DESYREL) 100 MG tablet Take 1 tablet (100 mg total) by mouth at bedtime. 90 tablet 3   Triamcinolone Acetonide 0.025 % LOTN Apply to scalp nightly x 2 weeks as needed 60 mL 2   TRULICITY 3 MG/0.5ML  SOPN INJECT 3MG  INTO THE SKIN ONCE WEEKLY 6 mL 2   No current facility-administered medications on file prior to visit.   Allergies  Allergen Reactions   Statins Other (See Comments)    MUSCLE ACHES   Prednisone    Family History  Problem Relation Age of Onset   Ovarian cancer Mother    Arthritis Mother    Cancer Mother    Depression Mother    Heart attack Father    Kidney failure Father    Heart disease Father    Hyperlipidemia Father    Kidney disease Father    Diabetes Father    Sudden Cardiac Death Brother    Alcohol abuse Brother    Asthma Brother    Diabetes Brother    Early death Brother    Heart disease Brother    Hyperlipidemia Brother    Hypertension Brother    Heart attack Brother    Ovarian cancer Maternal Grandmother    Arthritis Maternal Grandmother    Cancer Maternal Grandmother    COPD Maternal Grandmother    Parkinson's disease Maternal Grandfather    Heart failure Paternal Grandmother    Diabetes Brother    Heart disease Brother    Hyperlipidemia Brother    Breast cancer Neg Hx     PE: BP 120/78 (BP Location: Right Arm, Patient Position: Sitting, Cuff Size: Normal)   Pulse 82   Ht 5\' 9"  (1.753 m)   Wt 149 lb (67.6 kg)   SpO2 96%   BMI 22.00 kg/m   Wt Readings from Last 3 Encounters:  06/01/21 149 lb (67.6 kg)  04/30/21 148 lb 3.2  oz (67.2 kg)  03/04/21 148 lb 6.4 oz (67.3 kg)   Constitutional: normal weight, in NAD Eyes: PERRLA, EOMI, no exophthalmos ENT: moist mucous membranes, no thyromegaly, no cervical lymphadenopathy Cardiovascular: RRR, No MRG Respiratory: CTA B Gastrointestinal: abdomen soft, NT, ND, BS+ Musculoskeletal: no deformities, strength intact in all 4 Skin: moist, warm, no rashes Neurological: no tremor with outstretched hands, DTR normal in all 4  ASSESSMENT: 1. 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 HL  PLAN:  1. Patient with longstanding, previously uncontrolled type 2 diabetes, improved on a regimen containing metformin, sulfonylurea, and SGLT2 inhibitor along with weekly GLP-1 receptor agonist.  She is on low-dose metformin ER due to previous diarrhea.  At last visit, per review of the CGM downloads, sugars were low normal overnight, with occasional blood sugars in the 50s.  During the day, sugars were increasing and they were highest after lunch.  She was eating pasta for lunch but was doing a good job in keeping this as healthy as possible.  Sugars are dropping after dinner.  Due to her low blood sugars overnight and the fact that she was not on insulin, we stopped her glipizide XL.  I advised her to use glipizide instant release before breakfast and two-point 5-5 if needed before lunch if the sugars remained elevated after this meal.  Otherwise, we continued the same regimen.  HbA1c at that time was slightly higher, at 6.4%. CGM interpretation: -At today's visit, we reviewed her CGM downloads: It appears that 85% of values are in target range (goal >70%), while 15% are higher than 180 (goal <25%), and 0% are lower than 70 (goal <4%).  The calculated average blood sugar is 138.  The projected HbA1c for the next 3 months (GMI) is 6.6%. -Reviewing the CGM trends,  it appears that her sugars are still excellent overnight and they increase  after breakfast and then significantly after lunch.  After dinner, sugars are mostly at goal with occasional slightly higher values.  No more lows since last visit. -She tells me that she actually went back to taking the glipizide XL after our last visit, as she felt that her sugars were higher without it.  She is also adding the instant release glipizide before breakfast and occasionally adding 5 mg before lunch but not always.  For example, she went out to eat lunch with her son and sugars increased to 350 afterwards 10 days ago, as she forgot the glipizide. -At this visit, we discussed about moving the entire dose of metformin in the morning to hopefully reduce the blood sugars throughout the day and will continue with her current glipizide regimen but I did advise her to try not to forget the glipizide before lunch especially if she is eating out.  At next visit, we may need to increase Trulicity or [facility]. - I suggested to:  Patient Instructions  Please move: - Metformin ER 1000 mg with b'fast   Continue: - Glipizide XL 5 mg before b'fast - Glipizide 5 mg before b'fast (+/- 5 mg before a larger lunch) - Farxiga 10 mg before b'fast - Trulicity 3 mg weekly   Please return in 4 months.   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 HL -Reviewed latest lipid panel from 08/2020: LDL very high, the rest the fractions at goal: Lab Results  Component Value Date   CHOL 250 (H) 08/08/2020   HDL 64 08/08/2020   LDLCALC 171 (H) 08/08/2020   TRIG 89 08/08/2020   CHOLHDL 3.9 08/08/2020  -She had muscle aches and bone pain from statins and muscle pains from Zetia even at a low dose 5 mg daily -She is seen in  the lipid clinic.  PA for several cholesterol medications were denied. A appeal was sent with information about her brother dying at 1458 from massive heart attack and his father dying in his 2560s from heart disease.  -She is on co-Q10 and fish oil -At last visit I suggested to look up the portfolio diet but she forgot...  Carlus Pavlovristina Areen Trautner, MD PhD [facility]

## 2021-06-01 NOTE — Patient Instructions (Addendum)
Please move: - Metformin ER 1000 mg with b'fast   Continue: - Glipizide XL 5 mg before b'fast - Glipizide 5 mg before b'fast (+/- 5 mg before a larger lunch) - Farxiga 10 mg before b'fast - Trulicity 3 mg weekly   Please return in 4 months.

## 2021-07-01 ENCOUNTER — Encounter: Payer: Self-pay | Admitting: Internal Medicine

## 2021-07-07 ENCOUNTER — Telehealth: Payer: Self-pay

## 2021-07-07 NOTE — Telephone Encounter (Signed)
Patient is wishing to be worked in, needs her physical before January but after December 1st. Next available physical is not until march.

## 2021-07-07 NOTE — Telephone Encounter (Signed)
Patient had to be rescheduled for her annual physical due to [facility] no longer doing 8 am appointments. Please advise where to schedule patient, her last CPE was 12/1.

## 2021-07-07 NOTE — Telephone Encounter (Signed)
Patient has been scheduled

## 2021-07-15 ENCOUNTER — Other Ambulatory Visit: Payer: Self-pay

## 2021-07-15 ENCOUNTER — Telehealth: Payer: Self-pay

## 2021-07-15 ENCOUNTER — Other Ambulatory Visit: Payer: Self-pay | Admitting: Internal Medicine

## 2021-07-15 DIAGNOSIS — J301 Allergic rhinitis due to pollen: Secondary | ICD-10-CM

## 2021-07-15 NOTE — Telephone Encounter (Signed)
Pt called regarding a referral to an Allergist. Haniya stated that she saw Alyssa a few months ago and mentioned the referral. Meesha stated that Alyssa told her to call back if she isnt better so the referral can be placed. Please Advise.

## 2021-07-15 NOTE — Telephone Encounter (Signed)
Referral Placed 

## 2021-07-15 NOTE — Telephone Encounter (Signed)
Please advise 

## 2021-07-28 ENCOUNTER — Other Ambulatory Visit: Payer: Self-pay | Admitting: Internal Medicine

## 2021-08-11 ENCOUNTER — Other Ambulatory Visit: Payer: Self-pay

## 2021-08-11 MED ORDER — MONTELUKAST SODIUM 10 MG PO TABS: 10.0000 mg | ORAL_TABLET | Freq: Every day | ORAL | 1 refills | Status: DC

## 2021-08-17 ENCOUNTER — Ambulatory Visit: Payer: Managed Care, Other (non HMO) | Admitting: Physician Assistant

## 2021-08-17 ENCOUNTER — Telehealth: Payer: Self-pay | Admitting: Internal Medicine

## 2021-08-17 ENCOUNTER — Other Ambulatory Visit: Payer: Self-pay

## 2021-08-17 ENCOUNTER — Encounter: Payer: Self-pay | Admitting: Physician Assistant

## 2021-08-17 VITALS — BP 110/80 | HR 79 | Temp 97.3°F | Ht 69.0 in | Wt 150.5 lb

## 2021-08-17 DIAGNOSIS — R6884 Jaw pain: Secondary | ICD-10-CM | POA: Diagnosis not present

## 2021-08-17 DIAGNOSIS — E1165 Type 2 diabetes mellitus with hyperglycemia: Secondary | ICD-10-CM

## 2021-08-17 MED ORDER — DICLOFENAC SODIUM 75 MG PO TBEC: 75.0000 mg | DELAYED_RELEASE_TABLET | Freq: Two times a day (BID) | ORAL | 0 refills | Status: DC

## 2021-08-17 MED ORDER — METFORMIN HCL ER 500 MG PO TB24: 1000.0000 mg | ORAL_TABLET | Freq: Two times a day (BID) | ORAL | 2 refills | Status: DC

## 2021-08-17 MED ORDER — CYCLOBENZAPRINE HCL 5 MG PO TABS: 5.0000 mg | ORAL_TABLET | Freq: Three times a day (TID) | ORAL | 1 refills | Status: DC | PRN

## 2021-08-17 NOTE — Telephone Encounter (Signed)
Patient's metformin has now been sent in to [facility] (pharmacy)

## 2021-08-17 NOTE — Telephone Encounter (Signed)
MEDICATION: Metformin 500MG  2 tablets 2x per day  PHARMACY:  [facility] [address] in [place]  HAS THE PATIENT CONTACTED THEIR PHARMACY?  yes  IS THIS A 90 DAY SUPPLY : yes  IS PATIENT OUT OF MEDICATION: no   IF NOT; HOW MUCH IS LEFT: 2 days worth  LAST APPOINTMENT DATE: @10 /25/2022  NEXT APPOINTMENT DATE:@12 /29/2022  DO WE HAVE YOUR PERMISSION TO LEAVE A DETAILED MESSAGE?: yes - [phone]  OTHER COMMENTS:    **Let patient know to contact pharmacy at the end of the day to make sure medication is ready. **  ** Please notify patient to allow 48-72 hours to process**  **Encourage patient to contact the pharmacy for refills or they can request refills through [facility]**

## 2021-08-17 NOTE — Patient Instructions (Addendum)
It was great to see you!  Suspect TMJ  Start oral flexeril (muscle relaxer) and diclofenac (anti-inflammatory) Recommend starting over the counter antacid medication such as prilosec or nexium (generic is fine) to prevent gastritis  Trial exercises  If no improvement in two weeks or worsening in the meantime, let me know  Take care,  Jarold Motto PA-C

## 2021-08-17 NOTE — Progress Notes (Signed)
Charlotte Horn is a 60 y.o. female here for jaw pain.  History of Present Illness:   Chief Complaint  Patient presents with   Jaw Pain    Pt c/o right jaw pain past few months off and on, but now worse past 2-3 weeks consistently. She saw her dentist last week no abnormalities was told to see PCP.    HPI   Jaw Pain Charlotte Horn expresses she has been experiencing intermittent right sided jaw pain for a few months, worsening over the past 2-3 weeks. She described the discomfort as a sharp pain in her jaw and pressure that is radiating on the right side of her face. Occasionally she experiences a popping sensation while eating and opening and closing her mouth. Charlotte Horn reports she does not feel as though sx have changed, just the intensity of those sx over time. To manage her sx she has been taking tylenol which has provided her with minor relief. Charlotte Horn states she saw her dentist recently regarding this issue and was told there were no abnormalities.   Following this visit she was advised to visit her PCP for concern this issue could be Temporomandibular joint dysfunction ,TMJ. 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 Pt does believe she had a undiagnosed case of TMJ about ten years ago but never went to the doctor. Instead she opted to eat smaller and softer foods. Denies changes in vision, excessive chewing, increased saliva production, medication changes, or recent injury.    Past Medical History:  Diagnosis Date   Allergy    Anxiety    Asthma    Diabetes mellitus without complication (HCC)    Family history of early CAD 04/11/2017   Hyperlipidemia 04/11/2017   Hypertension      Social History   Tobacco Use   Smoking status: Never   Smokeless tobacco: Never  Substance Use Topics   Alcohol use: Yes   Drug use: Never    Past Surgical History:  Procedure Laterality Date   CESAREAN SECTION      CHOLECYSTECTOMY      Family History  Problem Relation Age of Onset   Ovarian cancer Mother    Arthritis Mother    Cancer Mother    Depression Mother    Heart attack Father    Kidney failure Father    Heart disease Father    Hyperlipidemia Father    Kidney disease Father    Diabetes Father    Sudden Cardiac Death Brother    Alcohol abuse Brother    Asthma Brother    Diabetes Brother    Early death Brother    Heart disease Brother    Hyperlipidemia Brother    Hypertension Brother    Heart attack Brother    Ovarian cancer Maternal Grandmother    Arthritis Maternal Grandmother    Cancer Maternal Grandmother    COPD Maternal Grandmother    Parkinson's disease Maternal Grandfather    Heart failure Paternal Grandmother    Diabetes Brother    Heart disease Brother    Hyperlipidemia Brother    Breast cancer Neg Hx     Allergies  Allergen Reactions   Statins Other (See Comments)    MUSCLE ACHES   Prednisone     Current Medications:   Current Outpatient Medications:    Alpha-Lipoic Acid 100 MG CAPS, Take 6 capsules by mouth 2 (two) times daily., Disp: ,  Rfl:    b complex vitamins capsule, Take 1 capsule by mouth daily., Disp: , Rfl:    cholecalciferol (VITAMIN D) 1000 units tablet, Take 5,000 Units by mouth daily., Disp: , Rfl:    co-enzyme Q-10 50 MG capsule, Take 100 mg by mouth daily., Disp: , Rfl:    Continuous Blood Gluc Receiver (FREESTYLE LIBRE 14 DAY READER) DEVI, 1 each by Does not apply route as directed., Disp: 1 Device, Rfl: 0   Continuous Blood Gluc Sensor (FREESTYLE LIBRE 14 DAY SENSOR) MISC, Place 1 each onto the skin every 14 (fourteen) days., Disp: 6 each, Rfl: 3   Cranberry 125 MG TABS, Take 1 tablet by mouth daily., Disp: , Rfl:    cyclobenzaprine (FLEXERIL) 5 MG tablet, Take 1 tablet (5 mg total) by mouth 3 (three) times daily as needed for muscle spasms., Disp: 30 tablet, Rfl: 1   dapagliflozin propanediol (FARXIGA) 10 MG TABS tablet, Take 1 tablet (10  mg total) by mouth daily before breakfast., Disp: 90 tablet, Rfl: 3   diclofenac (VOLTAREN) 75 MG EC tablet, Take 1 tablet (75 mg total) by mouth 2 (two) times daily., Disp: 30 tablet, Rfl: 0   fexofenadine (ALLEGRA) 180 MG tablet, Take 1 tablet by mouth daily., Disp: , Rfl:    glipiZIDE (GLUCOTROL XL) 5 MG 24 hr tablet, TAKE 1 TABLET(5 MG) BY MOUTH DAILY, Disp: 90 tablet, Rfl: 3   glucose blood (FREESTYLE TEST STRIPS) test strip, Use once a day - for freestyle libre, Disp: 100 each, Rfl: 3   ipratropium (ATROVENT) 0.06 % nasal spray, Place 1 spray into both nostrils., Disp: , Rfl:    losartan (COZAAR) 100 MG tablet, TAKE 1 TABLET(100 MG) BY MOUTH DAILY, Disp: 90 tablet, Rfl: 3   Magnesium Oxide (MAG-200 PO), Take 1 tablet by mouth daily., Disp: , Rfl:    metFORMIN (GLUCOPHAGE-XR) 500 MG 24 hr tablet, Take 2 tablets (1,000 mg total) by mouth 2 (two) times daily., Disp: 180 tablet, Rfl: 2   montelukast (SINGULAIR) 10 MG tablet, Take 1 tablet (10 mg total) by mouth at bedtime., Disp: 90 tablet, Rfl: 1   Multiple Vitamin (THERA) TABS, Take 1 tablet by mouth daily., Disp: , Rfl:    Omega-3 Fatty Acids (FISH OIL) 1000 MG CAPS, Take 2 capsules by mouth daily. , Disp: , Rfl:    PREVIDENT 5000 BOOSTER PLUS 1.1 % PSTE, SMARTSIG:To Teeth PRN, Disp: , Rfl:    PROAIR HFA 108 (90 Base) MCG/ACT inhaler, Inhale 2 puffs into the lungs as needed., Disp: , Rfl: 0   sertraline (ZOLOFT) 100 MG tablet, Take 1 tablet (100 mg total) by mouth daily., Disp: 90 tablet, Rfl: 3   traZODone (DESYREL) 100 MG tablet, Take 1 tablet (100 mg total) by mouth at bedtime., Disp: 90 tablet, Rfl: 3   Triamcinolone Acetonide 0.025 % LOTN, Apply to scalp nightly x 2 weeks as needed, Disp: 60 mL, Rfl: 2   TRULICITY 3 MG/0.5ML SOPN, INJECT 3MG  INTO THE SKIN ONCE WEEKLY, Disp: 6 mL, Rfl: 2   Review of Systems:   ROS Negative unless otherwise specified per HPI.  Vitals:   Vitals:   08/17/21 1413  BP: 110/80  Pulse: 79  Temp: (!)  97.3 F (36.3 C)  TempSrc: Temporal  SpO2: 98%  Weight: 150 lb 8 oz (68.3 kg)  Height: 5\' 9"  (1.753 m)     Body mass index is 22.22 kg/m.  Physical Exam:   Physical Exam Vitals and nursing note reviewed.  Constitutional:      General: She is not in acute distress.    Appearance: She is well-developed. She is not ill-appearing or toxic-appearing.  HENT:     Head: Normocephalic and atraumatic.     Jaw: Tenderness present.     Right Ear: Tympanic membrane, ear canal and external ear normal. Tympanic membrane is not erythematous, retracted or bulging.     Left Ear: Tympanic membrane, ear canal and external ear normal. Tympanic membrane is not erythematous, retracted or bulging.     Nose: Nose normal.     Right Sinus: No maxillary sinus tenderness or frontal sinus tenderness.     Left Sinus: No maxillary sinus tenderness or frontal sinus tenderness.     Mouth/Throat:     Pharynx: Uvula midline. No posterior oropharyngeal erythema.     Comments: Tenderness to R temporal mandibular area without significant swelling or erythema; normal ROM of jaw without significant clicking/popping Eyes:     General: Lids are normal.     Conjunctiva/sclera: Conjunctivae normal.  Neck:     Trachea: Trachea normal.  Cardiovascular:     Rate and Rhythm: Normal rate and regular rhythm.     Heart sounds: Normal heart sounds, S1 normal and S2 normal.  Pulmonary:     Effort: Pulmonary effort is normal.     Breath sounds: Normal breath sounds. No decreased breath sounds, wheezing, rhonchi or rales.  Lymphadenopathy:     Cervical: No cervical adenopathy.  Skin:    General: Skin is warm and dry.  Neurological:     Mental Status: She is alert.  Psychiatric:        Speech: Speech normal.        Behavior: Behavior normal. Behavior is cooperative.    Assessment and Plan:   Jaw pain No red flags on exam Patient also evaluated by Dr. Jimmey Ralph Suspect possible TMJ Trial oral diclofenac and flexeril Jaw  ROM exercises provided Follow-up if any new/worsening symptoms, consider advanced imaging if persists  I,Havlyn C Ratchford,acting as a scribe for [employer], PA.,have documented all relevant documentation on the behalf of Jarold Motto, PA,as directed by  Jarold Motto, PA while in the presence of Jarold Motto, [place].  I, Jarold Motto, [place], have reviewed all documentation for this visit. The documentation on 08/17/21 for the exam, diagnosis, procedures, and orders are all accurate and complete.   Jarold Motto, PA-C

## 2021-08-18 MED ORDER — METFORMIN HCL ER 500 MG PO TB24: 1000.0000 mg | ORAL_TABLET | Freq: Two times a day (BID) | ORAL | 1 refills | Status: DC

## 2021-08-18 NOTE — Addendum Note (Signed)
Addended by: Kenyon Ana on: 08/18/2021 08:58 AM   Modules accepted: Orders

## 2021-08-28 ENCOUNTER — Other Ambulatory Visit: Payer: Self-pay | Admitting: Internal Medicine

## 2021-09-04 ENCOUNTER — Encounter: Payer: Managed Care, Other (non HMO) | Admitting: Family Medicine

## 2021-09-14 ENCOUNTER — Other Ambulatory Visit: Payer: Self-pay | Admitting: Internal Medicine

## 2021-09-14 NOTE — Progress Notes (Signed)
NEW PATIENT Date of Service/Encounter:  09/16/21 Referring provider: Allwardt, Crist Infante, PA-C Primary care provider: Willow Ora, MD  Subjective:  Charlotte Horn is a 60 y.o. female with a PMHx of asthma, chronic rhinitis, hyperlipidemia, HTN, GAD, diabetes presenting today for evaluation of chronic rhinitis. History obtained from: chart review and patient.   Chronic rhinitis: started as a child Symptoms include: nasal congestion, rhinorrhea, post nasal drainage, sneezing, watery eyes, and itchy eyes  Occurs year-round with seasonal flares-Spring and Fall Potential triggers: outdoor pollens, dogs (got them a few years ago) Treatments tried: atrovent nasal spray not using, singulair in PM, allegra in AM, seldom use of steroid nasal spray Previous allergy testing:  yes-in early 90s , knows she was positive to many things, never did allergy shots but would be interested now History of reflux/heartburn: no  Asthma History:  -Diagnosed at age as an adult but years ago.  -Current symptoms include cough, shortness of breath, and wheezing 0-2 daytime symptoms in past month, 0 nighttime awakenings in past month Using rescue inhaler "once in a blue moon" -Limitations to daily activity: none - 0 ED visits, 0 UC visits and 0 oral steroids in the past year - 0 number of lifetime hospitalizations, - History of prior pneumonias: 0 - History of prior COVID-19 infection: 0 - Smoking exposure: none Previous Diagnostics:  - Most Recent AEC (12 /13 /22): 700 -Most Recent Chest Imaging: CXR on (3 /31 /17): Impression abnormal chest x-ray, personally reviewed by me agree with  Management:  - Previously used therapies: Never used a controller inhaler.  - Current regimen:  - Maintenance: Singulair - Rescue: Albuterol 2 puffs q4-6 hrs PRN, not using prior to exercise  She does not tolerate prednisone well, but has no other medication allergies.  Past Medical History: Past Medical  History:  Diagnosis Date   Allergy    Anxiety    Asthma    Deviated septum 08/16/2018   Diabetes mellitus without complication (HCC)    Family history of early CAD 04/11/2017   Hyperlipidemia 04/11/2017   Hypertension    Medication List:  Current Outpatient Medications  Medication Sig Dispense Refill   albuterol (VENTOLIN HFA) 108 (90 Base) MCG/ACT inhaler Inhale 2 puffs into the lungs every 6 (six) hours as needed for wheezing or shortness of breath. 8 g 2   azelastine (ASTELIN) 0.1 % nasal spray Place 2 sprays into both nostrils 2 (two) times daily as needed for rhinitis. Use in each nostril as directed 30 mL 12   b complex vitamins capsule Take 1 capsule by mouth daily.     cholecalciferol (VITAMIN D) 1000 units tablet Take 5,000 Units by mouth daily.     co-enzyme Q-10 50 MG capsule Take 100 mg by mouth daily.     Continuous Blood Gluc Receiver (FREESTYLE LIBRE 14 DAY READER) DEVI 1 each by Does not apply route as directed. 1 Device 0   Continuous Blood Gluc Sensor (FREESTYLE LIBRE 2 SENSOR) MISC PLACE 1 EACH ONTO THE SKIN EVERY 14 (FOURTEEN) DAYS. 6 each 3   Cranberry 125 MG TABS Take 1 tablet by mouth daily.     cyclobenzaprine (FLEXERIL) 5 MG tablet Take 1 tablet (5 mg total) by mouth 3 (three) times daily as needed for muscle spasms. 30 tablet 1   diclofenac (VOLTAREN) 75 MG EC tablet Take 1 tablet (75 mg total) by mouth 2 (two) times daily. 30 tablet 0   doxycycline (ADOXA) 100 MG tablet Take 1  tablet (100 mg total) by mouth 2 (two) times daily for 5 days. 10 tablet 0   FARXIGA 10 MG TABS tablet TAKE 1 TABLET(10 MG) BY MOUTH DAILY BEFORE AND BREAKFAST 90 tablet 3   fexofenadine (ALLEGRA) 180 MG tablet Take 1 tablet by mouth daily.     fluconazole (DIFLUCAN) 150 MG tablet Take 1 tablet (150 mg total) by mouth once a week. 2 tablet 0   glipiZIDE (GLUCOTROL XL) 5 MG 24 hr tablet TAKE 1 TABLET(5 MG) BY MOUTH DAILY 90 tablet 3   glucose blood (FREESTYLE TEST STRIPS) test strip Use once a  day - for freestyle libre 100 each 3   losartan (COZAAR) 100 MG tablet TAKE 1 TABLET(100 MG) BY MOUTH DAILY 90 tablet 3   Magnesium Oxide (MAG-200 PO) Take 1 tablet by mouth daily.     metFORMIN (GLUCOPHAGE-XR) 500 MG 24 hr tablet Take 2 tablets (1,000 mg total) by mouth 2 (two) times daily. 360 tablet 1   Multiple Vitamin (THERA) TABS Take 1 tablet by mouth daily.     Omega-3 Fatty Acids (FISH OIL) 1000 MG CAPS Take 2 capsules by mouth daily.      PREVIDENT 5000 BOOSTER PLUS 1.1 % PSTE SMARTSIG:To Teeth PRN     PROAIR HFA 108 (90 Base) MCG/ACT inhaler Inhale 2 puffs into the lungs as needed.  0   sertraline (ZOLOFT) 100 MG tablet Take 1 tablet (100 mg total) by mouth daily. 90 tablet 3   traZODone (DESYREL) 100 MG tablet Take 1 tablet (100 mg total) by mouth at bedtime. 90 tablet 3   Triamcinolone Acetonide 0.025 % LOTN Apply to scalp nightly x 2 weeks as needed 60 mL 2   TRULICITY 3 MG/0.5ML SOPN INJECT 3MG  INTO THE SKIN ONCE WEEKLY 6 mL 2   Alpha-Lipoic Acid 100 MG CAPS Take 6 capsules by mouth 2 (two) times daily.     ipratropium (ATROVENT) 0.06 % nasal spray Place 2 sprays into both nostrils 3 (three) times daily as needed for rhinitis. 15 mL 5   montelukast (SINGULAIR) 10 MG tablet Take 1 tablet (10 mg total) by mouth at bedtime. 90 tablet 1   No current facility-administered medications for this visit.   Known Allergies:  Allergies  Allergen Reactions   Statins Other (See Comments)    MUSCLE ACHES   Prednisone    Past Surgical History: Past Surgical History:  Procedure Laterality Date   CESAREAN SECTION     CHOLECYSTECTOMY     Family History: Family History  Problem Relation Age of Onset   Ovarian cancer Mother    Arthritis Mother    Cancer Mother    Depression Mother    Heart attack Father    Kidney failure Father    Heart disease Father    Hyperlipidemia Father    Kidney disease Father    Diabetes Father    Sudden Cardiac Death Brother    Alcohol abuse Brother     Asthma Brother    Diabetes Brother    Early death Brother    Heart disease Brother    Hyperlipidemia Brother    Hypertension Brother    Heart attack Brother    Ovarian cancer Maternal Grandmother    Arthritis Maternal Grandmother    Cancer Maternal Grandmother    COPD Maternal Grandmother    Parkinson's disease Maternal Grandfather    Heart failure Paternal Grandmother    Diabetes Brother    Heart disease Brother    Hyperlipidemia Brother  Breast cancer Neg Hx    Social History: Iasia lives in a house built 22 years ago, no water damage, carpet in the bedroom, gas heating, central AC, 2 dogs indoors, no cockroaches, using dust mite protection on bedding not pillows, no smoke exposure.  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  [place]   ROS:  All other systems negative except as noted per HPI.  Objective:  Blood pressure 122/76, pulse 91, temperature (!) 97.5 F (36.4 C), resp. rate 16, height 5\' 9"  (1.753 m), weight 151 lb (68.5 kg), SpO2 99 %. Body mass index is 22.3 kg/m. Physical Exam:  General Appearance:  Alert, cooperative, no distress, appears stated age, facial asymmetry  Head:  Normocephalic, without obvious abnormality, atraumatic  Eyes:  Conjunctiva clear, EOM's intact  Nose: Nares normal, bilateral hypertrophic turbinates with clear rhinorrhea, septum slightly deviated  Throat: Lips, tongue normal; teeth and gums normal, + cobblestoning  Neck: Supple, symmetrical  Lungs:   Clear to auscultation bilaterally, respirations unlabored, no coughing  Heart:  Regular rate and rhythm, no murmur, appears well perfused  Extremities: No edema  Skin: Skin color, texture, turgor normal, no rashes or lesions on visualized portions of skin  Neurologic: No gross deficits     Diagnostics: Spirometry:  Tracings reviewed. Her effort: Good reproducible efforts. FVC: 2.91L FEV1: 2.56L, 87%  predicted FEV1/FVC ratio: 111% Interpretation: Spirometry consistent with normal pattern.  Please see scanned spirometry results for details.  Skin Testing: Environmental allergy panel. Positive test to: bahia grass only. Negative test to: all others.  Adequate positive and negative controls.  Results discussed with patient/family.  Airborne Adult Perc - 09/16/21 0956     Time Antigen Placed 09/18/21    Allergen Manufacturer [id number]    Location Back    Number of Test 59    Panel 1 Select    1. Control-Buffer 50% Glycerol Negative    2. Control-Histamine 1 mg/ml 3+    3. Albumin saline 3+    4. Bahia 3+    5. Waynette Buttery Negative    6. Johnson Negative    7. Kentucky Blue Negative    8. Meadow Fescue Negative    9. Perennial Rye Negative    10. Sweet Vernal Negative    11. Timothy Negative    12. Cocklebur Negative    13. Burweed Marshelder Negative    14. Ragweed, short Negative    15. Ragweed, Giant Negative    16. Plantain,  English Negative    17. Lamb's Quarters Negative    18. Sheep Sorrell Negative    19. Rough Pigweed Negative    20. Marsh Elder, Rough Negative    21. Mugwort, Common Negative    22. Ash mix Negative    23. Birch mix Negative    24. Beech American Negative    25. Box, Elder Negative    26. Cedar, red Negative    27. Cottonwood, [place] Negative    28. Elm mix Negative    29. Hickory Negative    30. Maple mix Negative    31. Oak, [place] mix Negative    32. Pecan Pollen Negative    33. Pine mix Negative    34. Sycamore Eastern Negative    35. Walnut, Black Pollen Negative    36. Alternaria alternata Negative    37. Cladosporium Herbarum Negative    38. Aspergillus mix Negative    39. Penicillium  mix Negative    40. Bipolaris sorokiniana (Helminthosporium) Negative    41. Drechslera spicifera (Curvularia) Negative    42. Mucor plumbeus Negative    43. Fusarium moniliforme Negative    44. Aureobasidium pullulans (pullulara) Negative    45. Rhizopus  oryzae Negative    46. Botrytis cinera Negative    47. Epicoccum nigrum Negative    48. Phoma betae Negative    49. Candida Albicans Negative    50. Trichophyton mentagrophytes Negative    51. Mite, D Farinae  5,000 AU/ml Negative    52. Mite, D Pteronyssinus  5,000 AU/ml Negative    53. Cat Hair 10,000 BAU/ml Negative    54.  Dog Epithelia Negative    55. Mixed Feathers Negative    56. Horse Epithelia Negative    57. Cockroach, German Negative    58. Mouse Negative    59. Tobacco Leaf Negative             Intradermal - 09/16/21 1056     Time Antigen Placed 1051    Allergen Manufacturer Greer    Location Arm    Number of Test 14    Intradermal Select    Control Negative    [place] Negative    Johnson Negative    Ragweed mix Negative    Weed mix Negative    Tree mix Negative    Mold 1 Negative    Mold 2 Negative    Mold 3 Negative    Mold 4 Negative    Cat Negative    Dog Negative    Cockroach Negative    Mite mix Negative             Allergy testing results were read and interpreted by myself, documented by clinical staff.  Assessment and Plan  Allergy testing was overall negative with adequate positive and negative controls.  Would classify her chronic rhinitis as nonallergic (possibly NARES given recent absolute eosinophil count.)  Unfortunately, she is unable to tolerate nasal steroid sprays because it affects her blood sugar.  We will try plan as below, but did discuss potentially sending her for discussion of cryotherapy with ENT if responsive to Atrovent.  Only positive allergy test today was [place] grass.  Do not think she will benefit much from allergy shots.  Patient Instructions  Chronic Rhinitis mixed (allergic/nonallergic) with acute sinusitis-: - allergy testing today was positive to bahia grass only - allergen avoidance as below - avoiding nasal steroid sprays due to diabetes history and blood sugar elevation - Start Astelin (Azelastine) 1-2  sprays in each nostril twice a day as needed.  You may use this as needed for nasal congestion/itchy ears/itchy nose if desired-this is now over the counter. - Start Atrovent (Ipratropium Bromide) 1-2 sprays in each nostril up to 3 times a day as needed for runny nose/post nasal drip/drainage.  Use less frequently if airway gets too dry. - Continue Singulair (Montelukast)  daily. - Continue over the counter antihistamine daily or daily as needed.   -Your options include Zyrtec (Cetirizine) , Claritin (Loratadine) , Allegra (Fexofenadine) , or Xyzal (Levocetirinze)   For Sinusitis:  - wait to see if symptoms improve when restarting allergy medications If not, please start doxycycline 100 mg twice daily for 5 days -If you end up starting doxycycline, prescription for fluconazole has been sent to your pharmacy in event you develop yeast infection-to take 150 mg weekly for 2 weeks if needed.  Allergic Conjunctivitis:  - Start Allergy  Eye drops: great options include Pataday (Olopatadine) or Zaditor (ketotifen) for eye symptoms daily as needed-both sold over the counter if not covered by insurance.   -Avoid eye drops that say red eye relief as they may contain medications that dry out your eyes.  Intermittent Asthma: controlled Controller medication: Continue Singulair 10 mg daily - your lung testing today looked great! - Rescue Inhaler: Albuterol (Proair/Ventolin) 2 puffs . Use  every 4-6 hours as needed for chest tightness, wheezing, or coughing.  Can also use 15 minutes prior to exercise if you have symptoms with activity. - Asthma is not controlled if:  - Symptoms are occurring >2 times a week OR  - >2 times a month nighttime awakenings  - You are requiring systemic steroids (prednisone/steroid injections) more than once per year  - Your require hospitalization for your asthma.  - Please call the clinic to schedule a follow up if these symptoms arise  Follow-up in 6-8  weeks. If no improvement, will refer to ENT.  This note in its entirety was forwarded to the Provider who requested this consultation.  Thank you for your kind referral. I appreciate the opportunity to take part in [place] care. Please do not hesitate to contact me with questions.  Sincerely,  Tonny Bollman, MD [facility]

## 2021-09-15 ENCOUNTER — Encounter: Payer: Self-pay | Admitting: Family Medicine

## 2021-09-15 ENCOUNTER — Other Ambulatory Visit: Payer: Self-pay

## 2021-09-15 ENCOUNTER — Ambulatory Visit (INDEPENDENT_AMBULATORY_CARE_PROVIDER_SITE_OTHER): Payer: Managed Care, Other (non HMO) | Admitting: Family Medicine

## 2021-09-15 VITALS — BP 120/74 | HR 78 | Temp 97.6°F | Ht 69.0 in | Wt 149.2 lb

## 2021-09-15 DIAGNOSIS — E1142 Type 2 diabetes mellitus with diabetic polyneuropathy: Secondary | ICD-10-CM | POA: Diagnosis not present

## 2021-09-15 DIAGNOSIS — E1169 Type 2 diabetes mellitus with other specified complication: Secondary | ICD-10-CM | POA: Diagnosis not present

## 2021-09-15 DIAGNOSIS — M26623 Arthralgia of bilateral temporomandibular joint: Secondary | ICD-10-CM | POA: Diagnosis not present

## 2021-09-15 DIAGNOSIS — Z0000 Encounter for general adult medical examination without abnormal findings: Secondary | ICD-10-CM

## 2021-09-15 DIAGNOSIS — E1159 Type 2 diabetes mellitus with other circulatory complications: Secondary | ICD-10-CM

## 2021-09-15 DIAGNOSIS — F5104 Psychophysiologic insomnia: Secondary | ICD-10-CM

## 2021-09-15 DIAGNOSIS — I152 Hypertension secondary to endocrine disorders: Secondary | ICD-10-CM

## 2021-09-15 DIAGNOSIS — J4520 Mild intermittent asthma, uncomplicated: Secondary | ICD-10-CM | POA: Diagnosis not present

## 2021-09-15 DIAGNOSIS — E782 Mixed hyperlipidemia: Secondary | ICD-10-CM

## 2021-09-15 DIAGNOSIS — Z8249 Family history of ischemic heart disease and other diseases of the circulatory system: Secondary | ICD-10-CM

## 2021-09-15 DIAGNOSIS — F411 Generalized anxiety disorder: Secondary | ICD-10-CM

## 2021-09-15 LAB — POCT GLYCOSYLATED HEMOGLOBIN (HGB A1C): Hemoglobin A1C: 6.6 % — AB (ref 4.0–5.6)

## 2021-09-15 MED ORDER — TRAZODONE HCL 100 MG PO TABS: 100.0000 mg | ORAL_TABLET | Freq: Every day | ORAL | 3 refills | Status: DC

## 2021-09-15 NOTE — Progress Notes (Signed)
Subjective  Chief Complaint  Patient presents with   Annual Exam    Fasting   Hypertension   Hyperlipidemia   Diabetes    HPI: Charlotte Horn is a 60 y.o. female who presents to [facility] [place] today for a Female Wellness Visit. She also has the concerns and/or needs as listed above in the chief complaint. These will be addressed in addition to the Health Maintenance Visit.   Wellness Visit: annual visit with health maintenance review and exam without Pap  Health maintenance: Patient seeing GYN next week for female wellness and mammogram.  Colorectal cancer screens are up-to-date.  Immunizations are up-to-date.  Overall she is doing very well.  Continues to see her multiple specialist.  Multiple records reviewed.  Reviewed endocrinology and cardiology reports.  Seeing asthma and allergy next week for allergy testing.  Chronic disease f/u and/or acute problem visit: (deemed necessary to be done in addition to the wellness visit): Diabetes: Remains well controlled on multiple medications per endocrinology.  Normal renal function. Hypertension on losartan 100 mg daily.  No chest pain or shortness of breath.  No adverse effects.  Has been well controlled. Strong family history of CAD: Not currently taking a baby aspirin.  Has multiple risk factors.  Benefits possibly outweigh risks for her given her strong family history and inability to treat her hyperlipidemia due to intolerance to multiple cholesterol-lowering medicines.  She reports she does not qualify for Repatha Anxiety disorder is well controlled on sertraline daily. Insomnia is well controlled on trazodone 100 mg nightly.  She does need a refill.  No adverse effects. Reviewed recent office visit for TMJ: This continues to be mildly active.  She does clench her teeth.  Just recently bought a mouthguard. Asthma and allergies: No asthma flares.  Rare albuterol use.  Allergy testing to be done next  week.  Assessment  1. Annual physical exam   2. 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 Diabetic peripheral neuropathy associated with type 2 diabetes mellitus (HCC)   4. Combined hyperlipidemia associated with type 2 diabetes mellitus (HCC)   5. Family history of early CAD   6. GAD (generalized anxiety disorder)   7. Hypertension associated with diabetes (HCC)   8. Psychophysiological insomnia   9. Bilateral temporomandibular joint pain   10. Mild intermittent extrinsic asthma without complication      Plan  Female Wellness Visit: Age appropriate Health Maintenance and Prevention measures were discussed with patient. Included topics are cancer screening recommendations, ways to keep healthy (see AVS) including dietary and exercise recommendations, regular eye and dental care, use of seat belts, and avoidance of moderate alcohol use and tobacco use.  Mammogram scheduled for next week.  Other screens up-to-date BMI: discussed patient's BMI and encouraged positive lifestyle modifications to help get to or maintain a target BMI. HM needs and immunizations were addressed and ordered. See below for orders. See HM and immunization section for updates. Routine labs and screening tests ordered including cmp, cbc and lipids where appropriate. Discussed recommendations regarding Vit D and calcium supplementation (see AVS)  Chronic disease management visit and/or acute problem visit: Diabetes: Well-controlled.  Monitor renal function Hypertension is well controlled.  Continue losartan 100 mg daily.  Recheck renal function electrolytes. Hyperlipidemia: Intolerant to medications.  Continue healthy diet. Family history of CAD plus multiple risk factors: Consider aspirin 81 mg daily.  She will consult her cardiologist again. Continue sertraline  for mood, trazodone for sleep.  Both problems are well controlled History of subclinical thyroid disease: Check levels  today.  Continue muscle relaxer as tolerated for TMJ.  Monitor clenching and continue mouthguard.  Follow up: 12 months for complete physical Orders Placed This Encounter  Procedures   CBC with Differential/Platelet   Comprehensive metabolic panel   Lipid panel   TSH   T3   T4, free   POCT HgB A1C   Meds ordered this encounter  Medications   traZODone (DESYREL) 100 MG tablet    Sig: Take 1 tablet (100 mg total) by mouth at bedtime.    Dispense:  90 tablet    Refill:  3      Body mass index is 22.03 kg/m. 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) 06/10/2017    Priority: High   Combined hyperlipidemia associated with type 2 diabetes mellitus (HCC) 04/11/2017    Priority: High   Family history of early CAD 04/11/2017    Priority: High   Psychophysiological insomnia 09/03/2020    Priority: Medium    Seasonal allergic rhinitis 08/16/2018    Priority: Medium    Extrinsic asthma 01/18/2008    Priority: Medium    Bilateral temporomandibular joint pain 09/15/2021    Priority: Low   Chronic sinusitis 08/16/2018    Priority: Low   Health Maintenance  Topic Date Due   MAMMOGRAM  09/03/2021   HEMOGLOBIN A1C  03/16/2022   OPHTHALMOLOGY EXAM  09/02/2022   FOOT EXAM  09/15/2022   COLONOSCOPY (Pts 45-35yrs Insurance coverage will need to be confirmed)  10/04/2022   PAP SMEAR-Modifier  09/03/2024   Pneumococcal Vaccine 65-80 Years old (3 - PPSV23 if available, else PCV20) 09/01/2026   TETANUS/TDAP  09/04/2027    INFLUENZA VACCINE  Completed   COVID-19 Vaccine  Completed   Hepatitis C Screening  Completed   Zoster Vaccines- Shingrix  Completed   HPV VACCINES  Aged Out   HIV Screening  Discontinued   Immunization History  Administered Date(s) Administered   Influenza,inj,Quad PF,6+ Mos 07/25/2017, 07/11/2018, 06/28/2019   Influenza-Unspecified 07/03/2020, 07/17/2021   PFIZER Comirnaty(Gray Top)Covid-19 Tri-Sucrose Vaccine 02/04/2021   PFIZER(Purple Top)SARS-COV-2 Vaccination 12/06/2019, 12/27/2019, 07/03/2020   Pneumococcal Conjugate-13 08/15/2016, 08/01/2018   Pneumococcal Polysaccharide-23 10/04/2006, 10/06/2015, 09/03/2020   Tdap 04/22/2015, 09/03/2017   Unspecified SARS-COV-2 Vaccination 12/07/2019, 12/22/2019, 06/04/2020   Zoster Recombinat (Shingrix) 07/01/2019, 03/04/2021   Zoster, Live 06/04/2009   We updated and reviewed the patient's past history in detail and it is documented below. Allergies: Patient is allergic to statins and prednisone. Past Medical History Patient  has a past medical history of Allergy, Anxiety, Asthma, Deviated septum (08/16/2018), Diabetes mellitus without complication (HCC), Family history of early CAD (04/11/2017), Hyperlipidemia (04/11/2017), and Hypertension. Past Surgical History Patient  has a past surgical history that includes Cholecystectomy and Cesarean section. Family History: Patient family history includes Alcohol abuse in her brother; Arthritis in her maternal grandmother and mother; Asthma in her brother; COPD  in her maternal grandmother; Cancer in her maternal grandmother and mother; Depression in her mother; Diabetes in her brother, brother, and father; Early death in her brother; Heart attack in her brother and father; Heart disease in her brother, brother, and father; Heart failure in her paternal grandmother; Hyperlipidemia in her brother, brother, and father; Hypertension in her brother; Kidney disease in her father; Kidney failure in her  father; Ovarian cancer in her maternal grandmother and mother; Parkinson's disease in her maternal grandfather; Sudden Cardiac Death in her brother. Social History:  Patient  reports that she has never smoked. She has never used smokeless tobacco. She reports current alcohol use. She reports that she does not use drugs.  Review of Systems: Constitutional: negative for fever or malaise Ophthalmic: negative for photophobia, double vision or loss of vision Cardiovascular: negative for chest pain, dyspnea on exertion, or new LE swelling Respiratory: negative for SOB or persistent cough Gastrointestinal: negative for abdominal pain, change in bowel habits or melena Genitourinary: negative for dysuria or gross hematuria, no abnormal uterine bleeding or disharge Musculoskeletal: negative for new gait disturbance or muscular weakness Integumentary: negative for new or persistent rashes, no breast lumps Neurological: negative for TIA or stroke symptoms Psychiatric: negative for SI or delusions Allergic/Immunologic: negative for hives  Patient Care Team    Relationship Specialty Notifications Start End  Leamon Arnt, MD PCP - General Family Medicine  09/03/20   Skeet Latch, MD PCP - Cardiology Cardiology  10/17/20   Delsa Bern, MD Consulting Physician Obstetrics and Gynecology  09/03/20   Philemon Kingdom, MD Consulting Physician Endocrinology  09/03/20   Pixie Casino, MD Consulting Physician Cardiology  09/03/20    Comment: lipids clinic  Skeet Latch, MD Consulting Physician Cardiology  09/03/20     Objective  Vitals: BP 120/74   Pulse 78   Temp 97.6 F (36.4 C) (Temporal)   Ht 5\' 9"  (1.753 m)   Wt 149 lb 3.2 oz (67.7 kg)   BMI 22.03 kg/m  General:  Well developed, well nourished, no acute distress  Psych:  Alert and orientedx3,normal mood and affect HEENT:  Normocephalic, atraumatic, non-icteric sclera,  supple neck without adenopathy, mass or thyromegaly, nasal  congestion present Cardiovascular:  Normal S1, S2, RRR without gallop, rub or murmur Respiratory:  Good breath sounds bilaterally, CTAB with normal respiratory effort Gastrointestinal: normal bowel sounds, soft, non-tender, no noted masses. No HSM MSK: Osteoarthritic changes distal joints bilateral hands, no contusions. Joints are without erythema or swelling.  Skin:  Warm, no rashes or suspicious lesions noted Neurologic:    Mental status is normal. CN 2-11 are normal. Gross motor and sensory exams are normal. Normal gait. No tremor Diabetic Foot Exam: Appearance - no lesions, ulcers or calluses Skin - no sigificant pallor or erythema Monofilament testing - sensitive bilaterally in following locations:  Right - Great toe, medial, central, lateral ball and posterior foot intact  Left - Great toe, medial, central, lateral ball and posterior foot intact Pulses - +2 distally bilaterally  Commons side effects, risks, benefits, and alternatives for medications and treatment plan prescribed today were discussed, and the patient expressed understanding of the given instructions. Patient is instructed to call or message via MyChart if he/she has any questions or concerns regarding our treatment plan. No barriers to understanding were identified. We discussed Red Flag symptoms and signs in detail. Patient expressed understanding regarding what to do in case of urgent or emergency type symptoms.  Medication list was reconciled, printed  and provided to the patient in AVS. Patient instructions and summary information was reviewed with the patient as documented in the AVS. This note was prepared with assistance of Dragon voice recognition software. Occasional wrong-word or sound-a-like substitutions may have occurred due to the inherent limitations of voice recognition software  This visit occurred during the SARS-CoV-2 public health emergency.  Safety protocols were in place, including screening questions  prior to the visit, additional usage of staff PPE, and extensive cleaning of exam room while observing appropriate contact time as indicated for disinfecting solutions.

## 2021-09-15 NOTE — Patient Instructions (Signed)
Please return in 12 months for your annual complete physical; please come fasting.   I will release your lab results to you on your MyChart account with further instructions. Please reply with any questions.    If you have any questions or concerns, please don't hesitate to send me a message via MyChart or call the office at [phone]. Thank you for visiting with us today! It's our pleasure caring for you.    

## 2021-09-16 ENCOUNTER — Encounter: Payer: Self-pay | Admitting: Internal Medicine

## 2021-09-16 ENCOUNTER — Telehealth: Payer: Self-pay

## 2021-09-16 ENCOUNTER — Ambulatory Visit: Payer: Managed Care, Other (non HMO) | Admitting: Internal Medicine

## 2021-09-16 VITALS — BP 122/76 | HR 91 | Temp 97.5°F | Resp 16 | Ht 69.0 in | Wt 151.0 lb

## 2021-09-16 DIAGNOSIS — J310 Chronic rhinitis: Secondary | ICD-10-CM

## 2021-09-16 DIAGNOSIS — J4520 Mild intermittent asthma, uncomplicated: Secondary | ICD-10-CM | POA: Diagnosis not present

## 2021-09-16 DIAGNOSIS — E1142 Type 2 diabetes mellitus with diabetic polyneuropathy: Secondary | ICD-10-CM

## 2021-09-16 LAB — COMPREHENSIVE METABOLIC PANEL
ALT: 20 IU/L (ref 0–32)
AST: 18 IU/L (ref 0–40)
Albumin/Globulin Ratio: 1.8 (ref 1.2–2.2)
Albumin: 4.7 g/dL (ref 3.8–4.9)
Alkaline Phosphatase: 81 IU/L (ref 44–121)
BUN/Creatinine Ratio: 17 (ref 12–28)
BUN: 13 mg/dL (ref 8–27)
Bilirubin Total: 0.3 mg/dL (ref 0.0–1.2)
CO2: 23 mmol/L (ref 20–29)
Calcium: 9.7 mg/dL (ref 8.7–10.3)
Chloride: 101 mmol/L (ref 96–106)
Creatinine, Ser: 0.76 mg/dL (ref 0.57–1.00)
Globulin, Total: 2.6 g/dL (ref 1.5–4.5)
Glucose: 139 mg/dL — ABNORMAL HIGH (ref 70–99)
Potassium: 5.4 mmol/L — ABNORMAL HIGH (ref 3.5–5.2)
Sodium: 142 mmol/L (ref 134–144)
Total Protein: 7.3 g/dL (ref 6.0–8.5)
eGFR: 90 mL/min/{1.73_m2} (ref >59)

## 2021-09-16 LAB — CBC WITH DIFFERENTIAL/PLATELET
Basophils Absolute: 0 10*3/uL (ref 0.0–0.2)
Basos: 1 %
EOS (ABSOLUTE): 0.7 10*3/uL — ABNORMAL HIGH (ref 0.0–0.4)
Eos: 12 %
Hematocrit: 41.6 % (ref 34.0–46.6)
Hemoglobin: 13.9 g/dL (ref 11.1–15.9)
Immature Grans (Abs): 0 10*3/uL (ref 0.0–0.1)
Immature Granulocytes: 0 %
Lymphocytes Absolute: 1.6 10*3/uL (ref 0.7–3.1)
Lymphs: 30 %
MCH: 28.2 pg (ref 26.6–33.0)
MCHC: 33.4 g/dL (ref 31.5–35.7)
MCV: 84 fL (ref 79–97)
Monocytes Absolute: 0.3 10*3/uL (ref 0.1–0.9)
Monocytes: 6 %
Neutrophils Absolute: 2.8 10*3/uL (ref 1.4–7.0)
Neutrophils: 51 %
Platelets: 288 10*3/uL (ref 150–450)
RBC: 4.93 x10E6/uL (ref 3.77–5.28)
RDW: 12.9 % (ref 11.7–15.4)
WBC: 5.5 10*3/uL (ref 3.4–10.8)

## 2021-09-16 LAB — LIPID PANEL
Chol/HDL Ratio: 4 ratio (ref 0.0–4.4)
Cholesterol, Total: 259 mg/dL — ABNORMAL HIGH (ref 100–199)
HDL: 64 mg/dL (ref >39)
LDL Chol Calc (NIH): 167 mg/dL — ABNORMAL HIGH (ref 0–99)
Triglycerides: 156 mg/dL — ABNORMAL HIGH (ref 0–149)
VLDL Cholesterol Cal: 28 mg/dL (ref 5–40)

## 2021-09-16 LAB — TSH: TSH: 2.75 u[IU]/mL (ref 0.450–4.500)

## 2021-09-16 LAB — T3: T3, Total: 60 ng/dL — ABNORMAL LOW (ref 71–180)

## 2021-09-16 LAB — T4, FREE: Free T4: 1.28 ng/dL (ref 0.82–1.77)

## 2021-09-16 MED ORDER — IPRATROPIUM BROMIDE 0.06 % NA SOLN: 2.0000 | Freq: Three times a day (TID) | NASAL | 5 refills | Status: DC | PRN

## 2021-09-16 MED ORDER — DEXCOM G6 RECEIVER DEVI: 0 refills | Status: DC

## 2021-09-16 MED ORDER — AZELASTINE HCL 0.1 % NA SOLN: 2.0000 | Freq: Two times a day (BID) | NASAL | 12 refills | Status: DC | PRN

## 2021-09-16 MED ORDER — ALBUTEROL SULFATE HFA 108 (90 BASE) MCG/ACT IN AERS: 2.0000 | INHALATION_SPRAY | Freq: Four times a day (QID) | RESPIRATORY_TRACT | 2 refills | Status: DC | PRN

## 2021-09-16 MED ORDER — MONTELUKAST SODIUM 10 MG PO TABS: 10.0000 mg | ORAL_TABLET | Freq: Every day | ORAL | 1 refills | Status: DC

## 2021-09-16 MED ORDER — FLUCONAZOLE 150 MG PO TABS: 150.0000 mg | ORAL_TABLET | ORAL | 0 refills | Status: DC

## 2021-09-16 MED ORDER — DOXYCYCLINE MONOHYDRATE 100 MG PO TABS
100.0000 mg | ORAL_TABLET | Freq: Two times a day (BID) | ORAL | 0 refills | Status: AC
Start: 2021-09-16 — End: 2021-09-21

## 2021-09-16 MED ORDER — DEXCOM G6 TRANSMITTER MISC: 3 refills | Status: DC

## 2021-09-16 MED ORDER — DEXCOM G6 SENSOR MISC: 3 refills | Status: DC

## 2021-09-16 NOTE — Telephone Encounter (Signed)
Patient Advocate Encounter  Received notification from [employer] that the request for prior authorization for [facility] 2 sensors has been denied due to the pt not trying Eversense or Dexcom sensors.     This encounter will continue to be updated until final determination.     Specialty Pharmacy Patient Advocate Fax:  [phone]

## 2021-09-16 NOTE — Addendum Note (Signed)
Addended by: Kenyon Ana on: 09/16/2021 02:52 PM   Modules accepted: Orders

## 2021-09-16 NOTE — Telephone Encounter (Signed)
Patient Advocate Encounter   Received notification from patient's ins. that prior authorization for [facility] 2 sensors is required by his/her insurance [employer].  PA submitted on 09/16/21, faxed PA form & chart notes.  Status is pending    [facility] will continue to follow:  Patient Advocate Fax:  [phone]

## 2021-09-16 NOTE — Telephone Encounter (Signed)
I am ok with her using Dexcom, if she agrees.  Should I send this to [employer]?

## 2021-09-16 NOTE — Patient Instructions (Signed)
Chronic Rhinitis mixed (allergic/nonallergic) with acute sinusitis-: - allergy testing today was positive to bahia grass only - allergen avoidance as below - avoiding nasal steroid sprays due to diabetes history - Start Astelin (Azelastine) 1-2 sprays in each nostril twice a day as needed.  You may use this as needed for nasal congestion/itchy ears/itchy nose if desired-this is now over the counter. - Start Atrovent (Ipratropium Bromide) 1-2 sprays in each nostril up to 3 times a day as needed for runny nose/post nasal drip/drainage.  Use less frequently if airway gets too dry. - Continue Singulair (Montelukast) 10mg  daily. - Continue over the counter antihistamine daily or daily as needed.   -Your options include Zyrtec (Cetirizine) 10mg , Claritin (Loratadine) 10mg , Allegra (Fexofenadine) 180mg , or Xyzal (Levocetirinze) 5mg   For Sinusitis:  - wait to see if symptoms improve when restarting allergy medications If not, please start doxycycline 100 mg twice daily for 5 days -If you end up starting doxycycline, prescription for fluconazole has been sent to your pharmacy in event you develop yeast infection-to take 150 mg weekly for 2 weeks if needed.  Allergic Conjunctivitis:  - Start Allergy Eye drops: great options include Pataday (Olopatadine) or Zaditor (ketotifen) for eye symptoms daily as needed-both sold over the counter if not covered by insurance.   -Avoid eye drops that say red eye relief as they may contain medications that dry out your eyes.  Intermittent Asthma: controlled Controller medication: Continue Singulair 10 mg daily - your lung testing today looked great! - Rescue Inhaler: Albuterol (Proair/Ventolin) 2 puffs . Use  every 4-6 hours as needed for chest tightness, wheezing, or coughing.  Can also use 15 minutes prior to exercise if you have symptoms with activity. - Asthma is not controlled if:  - Symptoms are occurring >2 times a week OR  - >2 times a month nighttime  awakenings  - You are requiring systemic steroids (prednisone/steroid injections) more than once per year  - Your require hospitalization for your asthma.  - Please call the clinic to schedule a follow up if these symptoms arise  Follow-up in 6-8 weeks. If no improvement, will refer to ENT.  It was a pleasure meeting you in clinic today! If you receive a survey about today's experience, please consider giving feedback on how we're doing!  , MD [facility]   Reducing Pollen Exposure  The American Academy of Allergy, Asthma and Immunology suggests the following steps to reduce your exposure to pollen during allergy seasons.    Do not hang sheets or clothing out to dry; pollen may collect on these items. Do not mow lawns or spend time around freshly cut grass; mowing stirs up pollen. Keep windows closed at night.  Keep car windows closed while driving. Minimize morning activities outdoors, a time when pollen counts are usually at their highest. Stay indoors as much as possible when pollen counts or humidity is high and on windy days when pollen tends to remain in the air longer. Use air conditioning when possible.  Many air conditioners have filters that trap the pollen spores. Use a HEPA room air filter to remove pollen form the indoor air you breathe.

## 2021-09-16 NOTE — Telephone Encounter (Signed)
Pt agreed to try and request it be sent to [facility]. Rx sent to preferred pharmacy.

## 2021-09-17 ENCOUNTER — Other Ambulatory Visit: Payer: Self-pay

## 2021-09-17 DIAGNOSIS — E875 Hyperkalemia: Secondary | ICD-10-CM

## 2021-09-21 ENCOUNTER — Telehealth: Payer: Self-pay | Admitting: Pharmacy Technician

## 2021-09-21 ENCOUNTER — Other Ambulatory Visit (HOSPITAL_COMMUNITY): Payer: Self-pay

## 2021-09-21 NOTE — Telephone Encounter (Signed)
Patient Advocate Encounter   Received notification from [employer] that prior authorization for Dexcom is required by his/her insurance [employer].   PA submitted on 09/21/21 PA: PA Case ID: [id number] Key [id number] (transmitter) [id number] (sensors) (did not send pa for receiver, but if needed, Key is [id number] Status is pending    [facility] will continue to follow:  Patient Advocate Fax:  [phone]

## 2021-09-22 ENCOUNTER — Other Ambulatory Visit: Payer: Managed Care, Other (non HMO)

## 2021-09-22 ENCOUNTER — Other Ambulatory Visit (HOSPITAL_COMMUNITY): Payer: Self-pay

## 2021-09-22 DIAGNOSIS — E875 Hyperkalemia: Secondary | ICD-10-CM

## 2021-09-22 DIAGNOSIS — Z0000 Encounter for general adult medical examination without abnormal findings: Secondary | ICD-10-CM

## 2021-09-22 NOTE — Addendum Note (Signed)
Addended by: Lorn Junes on: 09/22/2021 09:58 AM   Modules accepted: Orders

## 2021-09-22 NOTE — Telephone Encounter (Signed)
Patient Advocate Encounter  Received notification from [employer] that the request for prior authorization for Dexcom has been denied due to not being on multiple (3 or more) insulin injections or on a pump.     Specialty Pharmacy Patient Advocate Fax:  [phone]

## 2021-09-22 NOTE — Telephone Encounter (Signed)
I am sorry to hear that.  In that case, he may need to pay for a CGM out-of-pocket.  If not, she would need to start checking blood sugars through fingersticks.  Previously, freestyle libre 2 was denied for her because she did not try Dexcom or ever since.  I do not usually attach Eversense, but I have a feeling that this would not be approved for her either, since Dexcom was denied.Marland KitchenMarland Kitchen

## 2021-09-22 NOTE — Telephone Encounter (Signed)
FYI

## 2021-09-23 LAB — BASIC METABOLIC PANEL
BUN/Creatinine Ratio: 16 (ref 12–28)
BUN: 13 mg/dL (ref 8–27)
CO2: 26 mmol/L (ref 20–29)
Calcium: 9.8 mg/dL (ref 8.7–10.3)
Chloride: 105 mmol/L (ref 96–106)
Creatinine, Ser: 0.82 mg/dL (ref 0.57–1.00)
Glucose: 149 mg/dL — ABNORMAL HIGH (ref 70–99)
Potassium: 5.1 mmol/L (ref 3.5–5.2)
Sodium: 144 mmol/L (ref 134–144)
eGFR: 82 mL/min/{1.73_m2} (ref >59)

## 2021-09-23 LAB — VITAMIN D 25 HYDROXY (VIT D DEFICIENCY, FRACTURES): Vit D, 25-Hydroxy: 96.3 ng/mL (ref 30.0–100.0)

## 2021-09-29 NOTE — Progress Notes (Signed)
Please call patient: I have reviewed his/her lab results. See the mychart note please.  Notify her of high vit D and need to cut back to 2000 units per day. thanks

## 2021-10-01 ENCOUNTER — Other Ambulatory Visit: Payer: Self-pay

## 2021-10-01 ENCOUNTER — Encounter: Payer: Self-pay | Admitting: Internal Medicine

## 2021-10-01 ENCOUNTER — Ambulatory Visit: Payer: Managed Care, Other (non HMO) | Admitting: Internal Medicine

## 2021-10-01 VITALS — BP 120/78 | HR 89 | Ht 69.0 in | Wt 147.8 lb

## 2021-10-01 DIAGNOSIS — E7800 Pure hypercholesterolemia, unspecified: Secondary | ICD-10-CM

## 2021-10-01 DIAGNOSIS — E1142 Type 2 diabetes mellitus with diabetic polyneuropathy: Secondary | ICD-10-CM | POA: Diagnosis not present

## 2021-10-01 MED ORDER — GLIPIZIDE 5 MG PO TABS: ORAL_TABLET | ORAL | 3 refills | Status: DC

## 2021-10-01 MED ORDER — FLUCONAZOLE 150 MG PO TABS
150.0000 mg | ORAL_TABLET | ORAL | 1 refills | Status: DC
Start: 2021-10-01 — End: 2021-12-16

## 2021-10-01 MED ORDER — FREESTYLE LIBRE 3 SENSOR MISC: 1.0000 | 3 refills | Status: DC

## 2021-10-01 NOTE — Progress Notes (Signed)
Patient ID: Charlotte Horn, female   DOB: 1960-11-24, 60 y.o.   MRN: [id number]   This visit occurred during the SARS-CoV-2 public health emergency.  Safety protocols were in place, including screening questions prior to the visit, additional usage of staff PPE, and extensive cleaning of exam room while observing appropriate contact time as indicated for disinfecting solutions.   HPI: Charlotte Horn is a 60 y.o.-year-old female,  returning for follow-up for DM2, dx in 2004, non-insulin-dependent, uncontrolled, without long-term complications.  Last visit 4 months ago.  Interim history: She continues to work from home (her company will not go back in person). No increased urination, blurry vision, nausea, chest pain. A freestyle libre 2 PA was recently denied by insurance on 09/17/2021. She is paying for it out of pocket.  Reviewed HbA1c levels: Lab Results  Component Value Date   HGBA1C 6.6 (A) 09/15/2021   HGBA1C 6.1 (A) 06/01/2021   HGBA1C 6.4 (A) 01/27/2021  02/15/2018: HbA1c 7.0% 05/12/2017: HbA1c 9.1% 02/01/2017: HbA1c 7.0%  She is on: - Metformin ER 1000 >> 500 mg 2x a day (decreased 2/2 stomach pbs)  - Glipizide XL 5 mg before b'fast  - Glipizide 5 >> 10 mg before b'fast (+/- 5 mg before a larger lunch) - Farxiga 5 mg in am-added 05/2020 >> 10 mg daily - Trulicity 1.5 >> 3 mg weekly  She tried Byetta, Bydureon (1 year) - but had diarrhea. She had very low CBGs with regular Glipizide.  Also, she had hot flashes from the glipizide especially after taking a hot shower.  She checks her sugars more than 4 times a day with her freestyle libre CGM:   Previously:   Previously:   Lowest sugar was 45 (lowest ever) ...  >> 53 >> 53 on CGM (inaccurate per rer report) >> 88 >> 60s; she has hypoglycemia awareness in the 60s. Highest sugar was 337... >> 270 >> 350 (eating out) >> upper 200s.  Glucometer: One Touch Ultra mini  Pt's meals are: - Breakfast: b'fast  bar (<15g carbs), almonds, cereal >> yoghurt + granola, nuts or skips - Lunch:  veggies, popcorn - Dinner: meat + veggies + rarely starch Diet sodas usually lower her sugars.  No CKD: Lab Results  Component Value Date   BUN 13 09/22/2021   BUN 13 09/15/2021   CREATININE 0.82 09/22/2021   CREATININE 0.76 09/15/2021  08/28/2019: CMP with slightly elevated glucose at 115, BUN/creatinine 10/0.61, GFR 101, ACR <7 02/01/2017: Glucose 185, BUN/creatinine 14/0.73, GFR 93. On losartan.  She has hyperlipidemia: Lab Results  Component Value Date   CHOL 259 (H) 09/15/2021   HDL 64 09/15/2021   LDLCALC 167 (H) 09/15/2021   TRIG 156 (H) 09/15/2021   CHOLHDL 4.0 09/15/2021  Previously:  08/28/2019: 240/159/57/154 02/01/2017: 253/166/59/161  On co-Q10 and fish oil. Nexletol PA was started by the lipid clinic >> denied.  Also, a PCSK9 inhibitor was denied by her insurance. She is now seen in the lipid clinic.  - last eye exam was in 08/2021: No DR. Dr. Hazle Quant.  -She has numbness and tingling in her feet.  She is on B complex and alpha lipoic acid.  On ASA 81.  Pt has FH of DM in father, PGM, 2 brothers, nephews  She works at [employer].  ROS: + See HPI Neurological: no tremors/+ numbness/+ tingling/no dizziness  I reviewed pt's medications, allergies, PMH, social hx, family hx, and changes were documented in the history of present illness. Otherwise, unchanged from my  initial visit note.  Past Medical History:  Diagnosis Date   Allergy    Anxiety    Asthma    Deviated septum 08/16/2018   Diabetes mellitus without complication (HCC)    Family history of early CAD 04/11/2017   Hyperlipidemia 04/11/2017   Hypertension    PSx H: - multiple eye Sx's: [id number] - C-section 1996 - Gall Bladder Sx 1984  Social History   Social History   Marital status: Married    Spouse name: N/A   Number of children: 1   Occupational History   Glass blower/designer   Social History Main  Topics   Smoking status: Never Smoker   Smokeless tobacco: Never Used   Alcohol use No   Drug use: No   Current Outpatient Medications on File Prior to Visit  Medication Sig Dispense Refill   albuterol (VENTOLIN HFA) 108 (90 Base) MCG/ACT inhaler Inhale 2 puffs into the lungs every 6 (six) hours as needed for wheezing or shortness of breath. 8 g 2   Alpha-Lipoic Acid 100 MG CAPS Take 6 capsules by mouth 2 (two) times daily.     azelastine (ASTELIN) 0.1 % nasal spray Place 2 sprays into both nostrils 2 (two) times daily as needed for rhinitis. Use in each nostril as directed 30 mL 12   b complex vitamins capsule Take 1 capsule by mouth daily.     cholecalciferol (VITAMIN D) 1000 units tablet Take 5,000 Units by mouth daily.     co-enzyme Q-10 50 MG capsule Take 100 mg by mouth daily.     Continuous Blood Gluc Receiver (DEXCOM G6 RECEIVER) DEVI Use as instructed to check blood sugar 1 each 0   Continuous Blood Gluc Sensor (DEXCOM G6 SENSOR) MISC Use as instructed. Change every 10 days 9 each 3   Continuous Blood Gluc Transmit (DEXCOM G6 TRANSMITTER) MISC Use as instructed change every 90 days 1 each 3   Cranberry 125 MG TABS Take 1 tablet by mouth daily.     cyclobenzaprine (FLEXERIL) 5 MG tablet Take 1 tablet (5 mg total) by mouth 3 (three) times daily as needed for muscle spasms. 30 tablet 1   diclofenac (VOLTAREN) 75 MG EC tablet Take 1 tablet (75 mg total) by mouth 2 (two) times daily. 30 tablet 0   FARXIGA 10 MG TABS tablet TAKE 1 TABLET(10 MG) BY MOUTH DAILY BEFORE AND BREAKFAST 90 tablet 3   fexofenadine (ALLEGRA) 180 MG tablet Take 1 tablet by mouth daily.     fluconazole (DIFLUCAN) 150 MG tablet Take 1 tablet (150 mg total) by mouth once a week. 2 tablet 0   glipiZIDE (GLUCOTROL XL) 5 MG 24 hr tablet TAKE 1 TABLET(5 MG) BY MOUTH DAILY 90 tablet 3   glucose blood (FREESTYLE TEST STRIPS) test strip Use once a day - for freestyle libre 100 each 3   ipratropium (ATROVENT) 0.06 % nasal  spray Place 2 sprays into both nostrils 3 (three) times daily as needed for rhinitis. 15 mL 5   losartan (COZAAR) 100 MG tablet TAKE 1 TABLET(100 MG) BY MOUTH DAILY 90 tablet 3   Magnesium Oxide (MAG-200 PO) Take 1 tablet by mouth daily.     metFORMIN (GLUCOPHAGE-XR) 500 MG 24 hr tablet Take 2 tablets (1,000 mg total) by mouth 2 (two) times daily. 360 tablet 1   montelukast (SINGULAIR) 10 MG tablet Take 1 tablet (10 mg total) by mouth at bedtime. 90 tablet 1   Multiple Vitamin (THERA) TABS Take 1 tablet by  mouth daily.     Omega-3 Fatty Acids (FISH OIL) 1000 MG CAPS Take 2 capsules by mouth daily.      PREVIDENT 5000 BOOSTER PLUS 1.1 % PSTE SMARTSIG:To Teeth PRN     PROAIR HFA 108 (90 Base) MCG/ACT inhaler Inhale 2 puffs into the lungs as needed.  0   sertraline (ZOLOFT) 100 MG tablet Take 1 tablet (100 mg total) by mouth daily. 90 tablet 3   traZODone (DESYREL) 100 MG tablet Take 1 tablet (100 mg total) by mouth at bedtime. 90 tablet 3   Triamcinolone Acetonide 0.025 % LOTN Apply to scalp nightly x 2 weeks as needed 60 mL 2   TRULICITY 3 MG/0.5ML SOPN INJECT 3MG  INTO THE SKIN ONCE WEEKLY 6 mL 2   No current facility-administered medications on file prior to visit.   Allergies  Allergen Reactions   Statins Other (See Comments)    MUSCLE ACHES   Prednisone    Family History  Problem Relation Age of Onset   Ovarian cancer Mother    Arthritis Mother    Cancer Mother    Depression Mother    Heart attack Father    Kidney failure Father    Heart disease Father    Hyperlipidemia Father    Kidney disease Father    Diabetes Father    Sudden Cardiac Death Brother    Alcohol abuse Brother    Asthma Brother    Diabetes Brother    Early death Brother    Heart disease Brother    Hyperlipidemia Brother    Hypertension Brother    Heart attack Brother    Ovarian cancer Maternal Grandmother    Arthritis Maternal Grandmother    Cancer Maternal Grandmother    COPD Maternal Grandmother     Parkinson's disease Maternal Grandfather    Heart failure Paternal Grandmother    Diabetes Brother    Heart disease Brother    Hyperlipidemia Brother    Breast cancer Neg Hx    PE: BP 120/78 (BP Location: Right Arm, Patient Position: Sitting, Cuff Size: Normal)    Pulse 89    Ht 5\' 9"  (1.753 m)    Wt 147 lb 12.8 oz (67 kg)    SpO2 99%    BMI 21.83 kg/m   Wt Readings from Last 3 Encounters:  10/01/21 147 lb 12.8 oz (67 kg)  09/16/21 151 lb (68.5 kg)  09/15/21 149 lb 3.2 oz (67.7 kg)   Constitutional: normal weight, in NAD Eyes: PERRLA, EOMI, no exophthalmos ENT: moist mucous membranes, no thyromegaly, no cervical lymphadenopathy Cardiovascular: RRR, No MRG Respiratory: CTA B Musculoskeletal: no deformities, strength intact in all 4 Skin: moist, warm, no rashes Neurological: no tremor with outstretched hands, DTR normal in all 4  ASSESSMENT: 1. 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 HL  PLAN:  1. Patient with longstanding, previously uncontrolled type 2 diabetes, improved on a regimen containing metformin, sulfonylurea, and SGLT2 inhibitor, along with weekly GLP-1 receptor agonist.  She is on a lower dose of metformin ER due to previous diarrhea.  I did advise her about possibly moving the entire dose of metformin in the morning to see if this helps with the higher blood sugars during the day.  At last visit, sugars were excellent overnight and they were increasing after breakfast and then significantly after lunch.  Sugars after dinner were mostly at goal with occasional slightly higher values.  She did not have  any more lows since the previous visit.  She was using extended release glipizide in the morning and instant release glipizide before breakfast and only occasionally before lunch.  However, she occasionally had large lunches and I advised her to also take an instant release glipizide before these meals.  HbA1c at last  visit was 6.1%, excellent, but she had a higher level, at 6.6% earlier this month. CGM interpretation: -At today's visit, we reviewed her CGM downloads: It appears that 85% of values are in target range (goal >70%), while 14% are higher than 180 (goal <25%), and 1% are lower than 70 (goal <4%).  The calculated average blood sugar is 138.  The projected HbA1c for the next 3 months (GMI) is 6.6%. -Reviewing the CGM trends, it appears that her sugars are low normal through the night, and then increase after breakfast and then more so after dinner.  If they are dropping right after dinner and stay in the low normal range afterwards. -Upon questioning, she tells me that she increased that in his diet before breakfast since last visit.  She is taking the glipizide extended release 5 mg along with 10 mg of instant release glipizide before this meal.  She is also taking 5 to 10 mg of glipizide before lunch when eating larger meals.  This actually helped with post lunch blood sugars.  In the morning, she recently changed breakfast to eating nuts and her blood sugars are much better in the last 4 days.  Since sugars are high after dinner and dropping afterwards, at this visit we discussed about stopping glipizide extended release and taking only 10 mg of glipizide IR before breakfast, 5 with lunch and 5 with dinner.  Otherwise, we can continue the same regimen. - I suggested to:  Patient Instructions  Please continue: - Metformin ER 500 mg 2x a day - Farxiga 10 mg before b'fast - Trulicity 3 mg weekly   Stop Glipizide ER.  Change: - Glipizide 10 mg before b'fast and 5 mg before a larger lunch and 5 mg before a larger dinner  Please return in 4 months.   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 HL -Reviewed latest lipid panel from 09/2021: LDL still much above target, triglycerides slightly high: Lab Results  Component Value Date   CHOL 259 (H) 09/15/2021   HDL 64 09/15/2021   LDLCALC 167 (H) 09/15/2021   TRIG 156 (H) 09/15/2021   CHOLHDL 4.0 09/15/2021  -She had muscle aches and bone pain from statins and muscle pains from Zetia even at the low dose of 5 mg daily. -She is seen in the lipid clinic -next appointment is next month.  PAs for several cholesterol medications were denied A appeal was sent with information about her brother dying at 41 from massive heart attack and his father dying in his 65s from heart disease.  -She is on co-Q10 and fish oil.  I suggested a portfolio diet, but she did not start this.  Carlus Pavlov, MD PhD [facility] Endocrinology

## 2021-10-01 NOTE — Patient Instructions (Addendum)
Please continue: - Metformin ER 500 mg 2x a day - Farxiga 10 mg before b'fast - Trulicity 3 mg weekly   Stop Glipizide ER.  Change: - Glipizide 10 mg before b'fast and 5 mg before a larger lunch and 5 mg before a larger dinner  Please return in 4 months.

## 2021-10-19 ENCOUNTER — Encounter: Payer: Self-pay | Admitting: Internal Medicine

## 2021-10-19 DIAGNOSIS — J069 Acute upper respiratory infection, unspecified: Secondary | ICD-10-CM | POA: Insufficient documentation

## 2021-10-20 ENCOUNTER — Telehealth: Payer: Self-pay | Admitting: *Deleted

## 2021-10-20 NOTE — Telephone Encounter (Signed)
Great.  I am happy to place the referral.    He will be particularly interested to your response to Atrovent (ipatropium).  This was sent at your last appointment.  If you haven't picked it up, I would try it for a few weeks to see if it helps.   Marlenne Ridge-can you help get her referred to [facility] (Dr. Hollace Hayward)?   Thanks!  Erin     Can we refer the patient to Dr. Hollace Hayward for Chronic Rhinitis from Dr. Maurine Minister?

## 2021-10-26 NOTE — Telephone Encounter (Signed)
Documentation is under the previous telephone contact.  Thanks

## 2021-11-04 ENCOUNTER — Ambulatory Visit: Payer: Managed Care, Other (non HMO) | Admitting: Internal Medicine

## 2021-11-05 ENCOUNTER — Other Ambulatory Visit: Payer: Self-pay

## 2021-11-05 DIAGNOSIS — E1142 Type 2 diabetes mellitus with diabetic polyneuropathy: Secondary | ICD-10-CM

## 2021-11-05 MED ORDER — TRULICITY 3 MG/0.5ML ~~LOC~~ SOAJ: SUBCUTANEOUS | 2 refills | Status: DC

## 2021-11-05 NOTE — Telephone Encounter (Signed)
Pt called back to advise rx was only filled for 30 day supply. Pharmacy was contacted and they confirmed due to the shortage they are unable to fill a 90 day rx. Pt was advised via [employer].

## 2021-11-13 ENCOUNTER — Other Ambulatory Visit: Payer: Self-pay

## 2021-11-13 DIAGNOSIS — E1142 Type 2 diabetes mellitus with diabetic polyneuropathy: Secondary | ICD-10-CM

## 2021-11-13 MED ORDER — TRULICITY 3 MG/0.5ML ~~LOC~~ SOAJ: SUBCUTANEOUS | 1 refills | Status: DC

## 2021-11-13 NOTE — Telephone Encounter (Signed)
Pt advised rx for metform was being filled at [facility]. Requested rx for Trulicity be sent to [facility].

## 2021-11-27 ENCOUNTER — Other Ambulatory Visit: Payer: Self-pay | Admitting: Internal Medicine

## 2021-11-27 DIAGNOSIS — E1142 Type 2 diabetes mellitus with diabetic polyneuropathy: Secondary | ICD-10-CM

## 2021-11-28 ENCOUNTER — Other Ambulatory Visit: Payer: Self-pay | Admitting: Family Medicine

## 2021-12-07 ENCOUNTER — Other Ambulatory Visit: Payer: Self-pay

## 2021-12-07 ENCOUNTER — Ambulatory Visit: Payer: Managed Care, Other (non HMO) | Admitting: Family

## 2021-12-07 ENCOUNTER — Encounter: Payer: Self-pay | Admitting: Family

## 2021-12-07 VITALS — BP 118/67 | HR 95 | Temp 98.3°F | Ht 69.0 in | Wt 142.2 lb

## 2021-12-07 DIAGNOSIS — B9689 Other specified bacterial agents as the cause of diseases classified elsewhere: Secondary | ICD-10-CM

## 2021-12-07 DIAGNOSIS — K1121 Acute sialoadenitis: Secondary | ICD-10-CM | POA: Diagnosis not present

## 2021-12-07 DIAGNOSIS — R6884 Jaw pain: Secondary | ICD-10-CM

## 2021-12-07 MED ORDER — AMOXICILLIN-POT CLAVULANATE 875-125 MG PO TABS: 1.0000 | ORAL_TABLET | Freq: Two times a day (BID) | ORAL | 0 refills | Status: DC

## 2021-12-07 MED ORDER — CYCLOBENZAPRINE HCL 5 MG PO TABS: 5.0000 mg | ORAL_TABLET | Freq: Three times a day (TID) | ORAL | 1 refills | Status: DC | PRN

## 2021-12-07 MED ORDER — DICLOFENAC SODIUM 75 MG PO TBEC: 75.0000 mg | DELAYED_RELEASE_TABLET | Freq: Two times a day (BID) | ORAL | 0 refills | Status: DC

## 2021-12-07 NOTE — Progress Notes (Signed)
? ?Subjective:  ? ? ? Patient ID: Charlotte Horn, female    DOB: [birth year], 61 y.o.   MRN: [id number] ? ?Chief Complaint  ?Patient presents with  ? Facial Swelling  ?  Symptoms started a week ago. Right side of face swollen.   ? ? ?HPI: ?Pain ?She reports recurrent jaw pain. There was not an injury that may have caused the pain. The pain started a few months ago and resolved, but then returned a week ago. The pain does not radiate. The pain is described as aching, pinching, sharp, and soreness, is moderate in intensity, occurring continuously, Symptoms are worse in the: anytime.  ?Aggravating factors: opening mouth, chewing.  She has tried NSAIDs and prescription pain relievers with mild relief.  ? ? ? ? ?Health Maintenance Due  ?Topic Date Due  ? MAMMOGRAM  09/03/2021  ? ? ?Past Medical History:  ?Diagnosis Date  ? Allergy   ? Anxiety   ? Asthma   ? Deviated septum 08/16/2018  ? Diabetes mellitus without complication (HCC)   ? Family history of early CAD 04/11/2017  ? Hyperlipidemia 04/11/2017  ? Hypertension   ? ? ?Past Surgical History:  ?Procedure Laterality Date  ? CESAREAN SECTION    ? CHOLECYSTECTOMY    ? ? ?Outpatient Medications Prior to Visit  ?Medication Sig Dispense Refill  ? albuterol (VENTOLIN HFA) 108 (90 Base) MCG/ACT inhaler Inhale 2 puffs into the lungs every 6 (six) hours as needed for wheezing or shortness of breath. 8 g 2  ? Alpha-Lipoic Acid 100 MG CAPS Take 6 capsules by mouth 2 (two) times daily.    ? azelastine (ASTELIN) 0.1 % nasal spray Place 2 sprays into both nostrils 2 (two) times daily as needed for rhinitis. Use in each nostril as directed 30 mL 12  ? b complex vitamins capsule Take 1 capsule by mouth daily.    ? cholecalciferol (VITAMIN D) 1000 units tablet Take 5,000 Units by mouth daily.    ? co-enzyme Q-10 50 MG capsule Take 100 mg by mouth daily.    ? Continuous Blood Gluc Sensor (FREESTYLE LIBRE 3 SENSOR) MISC 1 each by Does not apply route every 14 (fourteen) days. 6 each  3  ? Cranberry 125 MG TABS Take 1 tablet by mouth daily.    ? Dulaglutide (TRULICITY) 3 MG/0.5ML SOPN INJECT 3 MG INTO THE SKIN ONCE  WEEKLY 6 mL 3  ? FARXIGA 10 MG TABS tablet TAKE 1 TABLET(10 MG) BY MOUTH DAILY BEFORE AND BREAKFAST 90 tablet 3  ? fexofenadine (ALLEGRA) 180 MG tablet Take 1 tablet by mouth daily.    ? fluconazole (DIFLUCAN) 150 MG tablet Take 1 tablet (150 mg total) by mouth once a week. (Patient taking differently: Take 150 mg by mouth as needed.) 2 tablet 1  ? glipiZIDE (GLUCOTROL XL) 5 MG 24 hr tablet Take 5 mg by mouth daily.    ? glipiZIDE (GLUCOTROL) 5 MG tablet Take up to 4 tabs by mouth a day as advised 360 tablet 3  ? glucose blood (FREESTYLE TEST STRIPS) test strip Use once a day - for freestyle libre 100 each 3  ? ipratropium (ATROVENT) 0.06 % nasal spray Place 2 sprays into both nostrils 3 (three) times daily as needed for rhinitis. 15 mL 5  ? losartan (COZAAR) 100 MG tablet TAKE 1 TABLET(100 MG) BY MOUTH DAILY 90 tablet 3  ? Magnesium Oxide (MAG-200 PO) Take 1 tablet by mouth daily.    ? metFORMIN (GLUCOPHAGE-XR) 500  MG 24 hr tablet Take 2 tablets (1,000 mg total) by mouth 2 (two) times daily. 360 tablet 1  ? montelukast (SINGULAIR) 10 MG tablet Take 1 tablet (10 mg total) by mouth at bedtime. 90 tablet 1  ? Multiple Vitamin (THERA) TABS Take 1 tablet by mouth daily.    ? Omega-3 Fatty Acids (FISH OIL) 1000 MG CAPS Take 2 capsules by mouth daily.     ? PREVIDENT 5000 BOOSTER PLUS 1.1 % PSTE SMARTSIG:To Teeth PRN    ? PROAIR HFA 108 (90 Base) MCG/ACT inhaler Inhale 2 puffs into the lungs as needed.  0  ? sertraline (ZOLOFT) 100 MG tablet TAKE 1 TABLET(100 MG) BY MOUTH DAILY 90 tablet 3  ? traZODone (DESYREL) 100 MG tablet Take 1 tablet (100 mg total) by mouth at bedtime. 90 tablet 3  ? cyclobenzaprine (FLEXERIL) 5 MG tablet Take 1 tablet (5 mg total) by mouth 3 (three) times daily as needed for muscle spasms. 30 tablet 1  ? diclofenac (VOLTAREN) 75 MG EC tablet Take 1 tablet (75 mg  total) by mouth 2 (two) times daily. 30 tablet 0  ? Triamcinolone Acetonide 0.025 % LOTN Apply to scalp nightly x 2 weeks as needed (Patient not taking: Reported on 12/07/2021) 60 mL 2  ? ?No facility-administered medications prior to visit.  ? ? ?Allergies  ?Allergen Reactions  ? Humalog [Insulin Lispro] Other (See Comments)  ?  Muscle cramps  ? Statins Other (See Comments)  ?  MUSCLE ACHES  ? Prednisone   ? ? ? ?   ?Objective:  ?  ?Physical Exam ?Vitals and nursing note reviewed.  ?Constitutional:   ?   Appearance: Normal appearance.  ?HENT:  ?   Head:  ?   Jaw: Tenderness (right lower jaw towards chin), swelling (mild, right side) and pain on movement present.  ?   Salivary Glands: Right salivary gland is diffusely enlarged (also under tongue, mild swelling) and tender.  ?Cardiovascular:  ?   Rate and Rhythm: Normal rate and regular rhythm.  ?Pulmonary:  ?   Effort: Pulmonary effort is normal.  ?   Breath sounds: Normal breath sounds.  ?Musculoskeletal:     ?   General: Normal range of motion.  ?Skin: ?   General: Skin is warm and dry.  ?Neurological:  ?   Mental Status: She is alert.  ?Psychiatric:     ?   Mood and Affect: Mood normal.     ?   Behavior: Behavior normal.  ? ? ?BP 118/67 (BP Location: Left Arm, Patient Position: Sitting, Cuff Size: Normal)   Pulse 95   Temp 98.3 ?F (36.8 ?C) (Temporal)   Ht 5\' 9"  (1.753 m)   Wt 142 lb 4 oz (64.5 kg)   SpO2 95%   BMI 21.01 kg/m?  ?Wt Readings from Last 3 Encounters:  ?12/07/21 142 lb 4 oz (64.5 kg)  ?10/01/21 147 lb 12.8 oz (67 kg)  ?09/16/21 151 lb (68.5 kg)  ? ? ?   ?Assessment & Plan:  ? ?Problem List Items Addressed This Visit   ? ?Problem List Items Addressed This Visit   ?None ?Visit Diagnoses   ? ? Acute bacterial sialadenitis    -  Primary  ? reports having same sx back in November, seen by dentist, and no abnormalities found, she started wearing a mouthguard which seemed to be helping, & her pain eventually resolved, but then got Covid and stopped  wearing due to cough & sinus drg, and  then pain returned a week ago. She has erythema on right lower gum and under her tongue with 2 swollen bumps. She reports xerostomia d/t several different allergy meds. Most likely salivary gland is involved and since 2nd episode, will go ahead and treat w/abt along with refilling flexeril and Diclofenac. pt advised on use & SE. ? ?Relevant Medications  ? amoxicillin-clavulanate (AUGMENTIN) 875-125 MG tablet  ? Jaw pain      ? Relevant Medications  ? cyclobenzaprine (FLEXERIL) 5 MG tablet  ? diclofenac (VOLTAREN) 75 MG EC tablet  ? ?  ? ? ? ? ?Meds ordered this encounter  ?Medications  ? amoxicillin-clavulanate (AUGMENTIN) 875-125 MG tablet  ?  Sig: Take 1 tablet by mouth 2 (two) times daily after a meal.  ?  Dispense:  10 tablet  ?  Refill:  0  ?  Order Specific Question:   Supervising Provider  ?  Answer:   ANDY, CAMILLE L [[id number]]  ? cyclobenzaprine (FLEXERIL) 5 MG tablet  ?  Sig: Take 1 tablet (5 mg total) by mouth 3 (three) times daily as needed for muscle spasms.  ?  Dispense:  30 tablet  ?  Refill:  1  ?  Order Specific Question:   Supervising Provider  ?  Answer:   ANDY, CAMILLE L [[id number]]  ? diclofenac (VOLTAREN) 75 MG EC tablet  ?  Sig: Take 1 tablet (75 mg total) by mouth 2 (two) times daily.  ?  Dispense:  30 tablet  ?  Refill:  0  ?  Order Specific Question:   Supervising Provider  ?  Answer:   ANDY, CAMILLE L [[id number]]  ? ?

## 2021-12-07 NOTE — Patient Instructions (Addendum)
It was very nice to see you today! ? ?As discussed, I have sent an antibiotic to your pharmacy to take for your salivary gland infection. And I have also sent over refills for the Flexeril and Diclofenac for pain relief. ?Refer to the handout attached on how to prevent this condition. Continue drinking plenty of water and sometimes sucking on lozenges or hard candy can help increase the salivation in your mouth. ?You can also alternate ice and heat (whichever feels better) up to 20 minutes 3 times per day for pain. ? ?Let us know if you are not feeling better after finishing the antibiotic. ? ? ? ?PLEASE NOTE: ? ?If you had any lab tests please let us know if you have not heard back within a few days. You may see your results on MyChart before we have a chance to review them but we will give you a call once they are reviewed by [place]. If we ordered any referrals today, please let us know if you have not heard from their office within the next week.  ? ?Please try these tips to maintain a healthy lifestyle: ? ?Eat most of your calories during the day when you are active. Eliminate processed foods including packaged sweets (pies, cakes, cookies), reduce intake of potatoes, white bread, white pasta, and white rice. Look for whole grain options, oat flour or almond flour. ? ?Each meal should contain half fruits/vegetables, one quarter protein, and one quarter carbs (no bigger than a computer mouse). ? ?Cut down on sweet beverages. This includes juice, soda, and sweet tea. Also watch fruit intake, though this is a healthier sweet option, it still contains natural sugar! Limit to 3 servings daily. ? ?Drink at least 1 glass of water with each meal and aim for at least 8 glasses per day ? ?Exercise at least 150 minutes every week.  ? ?

## 2021-12-16 ENCOUNTER — Telehealth: Payer: Self-pay | Admitting: Family Medicine

## 2021-12-16 MED ORDER — FLUCONAZOLE 150 MG PO TABS: 150.0000 mg | ORAL_TABLET | ORAL | 0 refills | Status: DC | PRN

## 2021-12-16 NOTE — Telephone Encounter (Signed)
-   medication sent.

## 2021-12-16 NOTE — Telephone Encounter (Signed)
Please follow up for Diflucan rx. ?

## 2021-12-16 NOTE — Telephone Encounter (Signed)
Pt was seen by Hudnell on 12/07/21 and was given an antibiotic. She forgot to ask for the following medication. She states she always has to take this medication with any antibiotic due to them causing yeast infections. She now has one. Please advise ? ?MEDICATION:fluconazole (DIFLUCAN) 150 MG tablet ? ?PHARMACY: ?[facility] [phone] - [place], [place] - [address] [place] [address] [place] [address] Phone:  [phone]  ?Fax:  [phone]  ?  ? ?

## 2021-12-24 ENCOUNTER — Telehealth: Payer: Self-pay | Admitting: Internal Medicine

## 2021-12-24 ENCOUNTER — Other Ambulatory Visit: Payer: Self-pay | Admitting: *Deleted

## 2021-12-24 MED ORDER — IPRATROPIUM BROMIDE 0.06 % NA SOLN: 2.0000 | Freq: Three times a day (TID) | NASAL | 1 refills | Status: DC | PRN

## 2021-12-24 MED ORDER — AZELASTINE HCL 0.1 % NA SOLN: 2.0000 | Freq: Two times a day (BID) | NASAL | 1 refills | Status: DC | PRN

## 2021-12-24 NOTE — Telephone Encounter (Signed)
PATIENT REQUESTS 90 SUPPLY FOR THESE TWO PRESCRIPTIONS ?AZELASTINE AND IPRATROPIUM AT [facility] IN [place]. ?

## 2021-12-24 NOTE — Telephone Encounter (Signed)
Refills have been sent in. Called patient and advised. Patient verbalized understanding.  

## 2022-02-02 ENCOUNTER — Other Ambulatory Visit: Payer: Self-pay | Admitting: Physician Assistant

## 2022-02-05 ENCOUNTER — Ambulatory Visit: Payer: Managed Care, Other (non HMO) | Admitting: Internal Medicine

## 2022-03-04 ENCOUNTER — Other Ambulatory Visit: Payer: Self-pay | Admitting: Internal Medicine

## 2022-04-23 ENCOUNTER — Other Ambulatory Visit (HOSPITAL_COMMUNITY): Payer: Self-pay

## 2022-05-10 ENCOUNTER — Encounter: Payer: Self-pay | Admitting: Internal Medicine

## 2022-05-10 ENCOUNTER — Ambulatory Visit: Payer: Managed Care, Other (non HMO) | Admitting: Internal Medicine

## 2022-05-10 VITALS — BP 118/72 | HR 80 | Ht 69.0 in | Wt 147.0 lb

## 2022-05-10 DIAGNOSIS — E1142 Type 2 diabetes mellitus with diabetic polyneuropathy: Secondary | ICD-10-CM | POA: Diagnosis not present

## 2022-05-10 DIAGNOSIS — E7800 Pure hypercholesterolemia, unspecified: Secondary | ICD-10-CM

## 2022-05-10 MED ORDER — GLIPIZIDE ER 5 MG PO TB24: 5.0000 mg | ORAL_TABLET | Freq: Every day | ORAL | 3 refills | Status: DC

## 2022-05-10 NOTE — Patient Instructions (Signed)
Please continue: - Metformin ER 500 mg 2x a day - Farxiga 10 mg before b'fast - Trulicity 3 mg weekly  - Glipizide ER 5 mg in am - Glipizide 10 mg before b'fast and 5 mg before a larger lunch and 5 mg before a larger dinner  Please return in 4 months.

## 2022-05-10 NOTE — Progress Notes (Signed)
Patient ID: Charlotte Horn, female   DOB: September 29, 1961, 61 y.o.   MRN: [id number]   HPI: Charlotte Horn is a 61 y.o.-year-old female,  returning for follow-up for DM2, dx in 2004, non-insulin-dependent, uncontrolled, without long-term complications.  Last visit 7 months ago.  Interim history: She continues to work from home (her company will not go back in person). No increased urination, blurry vision, nausea, chest pain. 2 weeks ago she started Berberine - 500 mg 3x a day - but started to upset her stomach in am >> now only with lunch and dinner. Also, in the last 2 weeks she changed her Apple cycle vinegar capsules. Sugars are a little better.  Reviewed HbA1c levels: Lab Results  Component Value Date   HGBA1C 6.6 (A) 09/15/2021   HGBA1C 6.1 (A) 06/01/2021   HGBA1C 6.4 (A) 01/27/2021  02/15/2018: HbA1c 7.0% 05/12/2017: HbA1c 9.1% 02/01/2017: HbA1c 7.0%  She is on: - Metformin ER 1000 >> 500 mg 2x a day (decreased 2/2 stomach pbs)  - Glipizide XL 5 mg before b'fast  - Glipizide 5 >> 10 mg before b'fast (+/- 5 mg before a larger lunch) >> 10 mg before breakfast and 5 mg before a larger lunch or dinner. - Farxiga 5 mg in am-added 05/2020 >> 10 mg daily - Trulicity 1.5 >> 3 mg weekly  She tried Byetta, Bydureon (1 year) - but had diarrhea. She had very low CBGs with regular Glipizide.  Also, she had hot flashes from the glipizide especially after taking a hot shower.  A freestyle libre 2 PA was recently denied by insurance on 09/17/2021. She is paying for it out of pocket. She checks her sugars more than 4 times a day with her CGM:     Previously:   Previously:   Lowest sugar was 45 (lowest ever) ...  >> 53 on CGM (inaccurate per rer report) >> 88 >> 60s >> 60; she has hypoglycemia awareness in the 60s. Highest sugar was 350 (eating out) >> upper 200s >> 220.  Glucometer: One Touch Ultra mini  Pt's meals are: - Breakfast: b'fast bar (<15g carbs), almonds,  cereal >> yoghurt + granola, nuts or skips - Lunch:  veggies, popcorn - Dinner: meat + veggies + rarely starch Diet sodas usually lower her sugars.  No CKD: Lab Results  Component Value Date   BUN 13 09/22/2021   BUN 13 09/15/2021   CREATININE 0.82 09/22/2021   CREATININE 0.76 09/15/2021  08/28/2019: CMP with slightly elevated glucose at 115, BUN/creatinine 10/0.61, GFR 101, ACR <7 02/01/2017: Glucose 185, BUN/creatinine 14/0.73, GFR 93. On losartan.  She has hyperlipidemia: Lab Results  Component Value Date   CHOL 259 (H) 09/15/2021   HDL 64 09/15/2021   LDLCALC 167 (H) 09/15/2021   TRIG 156 (H) 09/15/2021   CHOLHDL 4.0 09/15/2021  Previously:  08/28/2019: 240/159/57/154 02/01/2017: 253/166/59/161  On co-Q10 and fish oil. Nexletol PA was started by the lipid clinic >> denied.  Also, a PCSK9 inhibitor was denied by her insurance. She is now seen in the lipid clinic.  - last eye exam was 09/02/2021: No DR. Dr. Hazle Quant.  -She has numbness and tingling in her feet.  She is on B complex and alpha lipoic acid.  Last foot exam 09/15/2021.  On ASA 81.  Pt has FH of DM in father, PGM, 2 brothers, nephews  She works at [employer].  ROS: + See HPI Neurological: no tremors/+ numbness/+ tingling/no dizziness  I reviewed pt's medications, allergies, PMH, social  hx, family hx, and changes were documented in the history of present illness. Otherwise, unchanged from my initial visit note.  Past Medical History:  Diagnosis Date   Allergy    Anxiety    Asthma    Deviated septum 08/16/2018   Diabetes mellitus without complication (HCC)    Family history of early CAD 04/11/2017   Hyperlipidemia 04/11/2017   Hypertension    PSx H: - multiple eye Sx's: 0630-1601 - C-section 1996 - Gall Bladder Sx 1984  Social History   Social History   Marital status: Married    Spouse name: N/A   Number of children: 1   Occupational History   Glass blower/designer   Social History Main  Topics   Smoking status: Never Smoker   Smokeless tobacco: Never Used   Alcohol use No   Drug use: No   Current Outpatient Medications on File Prior to Visit  Medication Sig Dispense Refill   albuterol (VENTOLIN HFA) 108 (90 Base) MCG/ACT inhaler Inhale 2 puffs into the lungs every 6 (six) hours as needed for wheezing or shortness of breath. 8 g 2   Alpha-Lipoic Acid 100 MG CAPS Take 6 capsules by mouth 2 (two) times daily.     amoxicillin-clavulanate (AUGMENTIN) 875-125 MG tablet Take 1 tablet by mouth 2 (two) times daily after a meal. 10 tablet 0   azelastine (ASTELIN) 0.1 % nasal spray Place 2 sprays into both nostrils 2 (two) times daily as needed for rhinitis. Use in each nostril as directed 90 mL 1   b complex vitamins capsule Take 1 capsule by mouth daily.     cholecalciferol (VITAMIN D) 1000 units tablet Take 5,000 Units by mouth daily.     co-enzyme Q-10 50 MG capsule Take 100 mg by mouth daily.     Continuous Blood Gluc Sensor (FREESTYLE LIBRE 3 SENSOR) MISC 1 each by Does not apply route every 14 (fourteen) days. 6 each 3   Cranberry 125 MG TABS Take 1 tablet by mouth daily.     cyclobenzaprine (FLEXERIL) 5 MG tablet Take 1 tablet (5 mg total) by mouth 3 (three) times daily as needed for muscle spasms. 30 tablet 1   diclofenac (VOLTAREN) 75 MG EC tablet Take 1 tablet (75 mg total) by mouth 2 (two) times daily. 30 tablet 0   Dulaglutide (TRULICITY) 3 MG/0.5ML SOPN INJECT 3 MG INTO THE SKIN ONCE  WEEKLY 6 mL 3   FARXIGA 10 MG TABS tablet TAKE 1 TABLET(10 MG) BY MOUTH DAILY BEFORE AND BREAKFAST 90 tablet 3   fexofenadine (ALLEGRA) 180 MG tablet Take 1 tablet by mouth daily.     fluconazole (DIFLUCAN) 150 MG tablet Take 1 tablet (150 mg total) by mouth every three (3) days as needed. 2 tablet 0   glipiZIDE (GLUCOTROL XL) 5 MG 24 hr tablet Take 5 mg by mouth daily.     glipiZIDE (GLUCOTROL) 5 MG tablet Take up to 4 tabs by mouth a day as advised 360 tablet 3   glucose blood (FREESTYLE  TEST STRIPS) test strip Use once a day - for freestyle libre 100 each 3   ipratropium (ATROVENT) 0.06 % nasal spray Place 2 sprays into both nostrils 3 (three) times daily as needed for rhinitis. 45 mL 1   losartan (COZAAR) 100 MG tablet TAKE 1 TABLET(100 MG) BY MOUTH DAILY 90 tablet 3   Magnesium Oxide (MAG-200 PO) Take 1 tablet by mouth daily.     metFORMIN (GLUCOPHAGE-XR) 500 MG 24 hr tablet  Take 2 tablets (1,000 mg total) by mouth 2 (two) times daily. 360 tablet 1   montelukast (SINGULAIR) 10 MG tablet Take 1 tablet (10 mg total) by mouth at bedtime. 90 tablet 1   Multiple Vitamin (THERA) TABS Take 1 tablet by mouth daily.     Omega-3 Fatty Acids (FISH OIL) 1000 MG CAPS Take 2 capsules by mouth daily.      PREVIDENT 5000 BOOSTER PLUS 1.1 % PSTE SMARTSIG:To Teeth PRN     PROAIR HFA 108 (90 Base) MCG/ACT inhaler Inhale 2 puffs into the lungs as needed.  0   sertraline (ZOLOFT) 100 MG tablet TAKE 1 TABLET(100 MG) BY MOUTH DAILY 90 tablet 3   traZODone (DESYREL) 100 MG tablet Take 1 tablet (100 mg total) by mouth at bedtime. 90 tablet 3   No current facility-administered medications on file prior to visit.   Allergies  Allergen Reactions   Humalog [Insulin Lispro] Other (See Comments)    Muscle cramps   Statins Other (See Comments)    MUSCLE ACHES   Prednisone    Family History  Problem Relation Age of Onset   Ovarian cancer Mother    Arthritis Mother    Cancer Mother    Depression Mother    Heart attack Father    Kidney failure Father    Heart disease Father    Hyperlipidemia Father    Kidney disease Father    Diabetes Father    Sudden Cardiac Death Brother    Alcohol abuse Brother    Asthma Brother    Diabetes Brother    Early death Brother    Heart disease Brother    Hyperlipidemia Brother    Hypertension Brother    Heart attack Brother    Ovarian cancer Maternal Grandmother    Arthritis Maternal Grandmother    Cancer Maternal Grandmother    COPD Maternal Grandmother     Parkinson's disease Maternal Grandfather    Heart failure Paternal Grandmother    Diabetes Brother    Heart disease Brother    Hyperlipidemia Brother    Breast cancer Neg Hx    PE: BP 118/72   Pulse 80   Ht 5\' 9"  (1.753 m)   Wt 147 lb (66.7 kg)   SpO2 96%   BMI 21.71 kg/m   Wt Readings from Last 3 Encounters:  05/10/22 147 lb (66.7 kg)  12/07/21 142 lb 4 oz (64.5 kg)  10/01/21 147 lb 12.8 oz (67 kg)   Constitutional: normal weight, in NAD Eyes: no exophthalmos ENT: moist mucous membranes, no masses palpated in neck, no cervical lymphadenopathy Cardiovascular: RRR, No MRG Respiratory: CTA B Musculoskeletal: no deformities Skin: moist, warm, no rashes Neurological: no tremor with outstretched hands  ASSESSMENT: 1. 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 HL  PLAN:  1. Patient with longstanding, previously uncontrolled type 2 diabetes, improved on a complex regimen containing metformin, SGLT2 inhibitor, sulfonylurea, and weekly GLP-1 receptor agonist.  She is on the lower dose of metformin due to diarrhea.  At last visit, sugars were low normal through the night and then increasing after breakfast and then more so after dinner.  They were dropping right after dinner and staying in the low normal range afterwards.  Few days prior to our last appointment, she started to change her diet and sugars appears to be improved.  We changed her glipizide dosing at that time.  HbA1c at last visit was at goal,  6.6%, but higher. -At today's visit, she tells me that the CGM interpretation is not covered by her insurance so we cannot continue to be Let's.  She printed out her CGM data for the last week and for the last 3 months.  Sugars are improved in the last 2 weeks after she started berberine and change her apple cider vinegar capsules.  The sugars still increase after breakfast and lunch and they are much better after dinner.  She  added back glipizide extended release since last visit.  For now, since the increase in blood sugars are not much higher than 150-180, we can continue the current regimen.  However, I did advise her to try to exercise in the morning.  She is playing with her dogs and walking at lunchtime and after work, but we discussed about doing some cardio exercise in the morning.  I am hoping that this will help with her blood sugars in the first half of the day.  She is not having a large breakfast, only a handful of nuts, blood sugars increase regardless, most likely due to dawn phenomenon/stress. - I suggested to:  Patient Instructions  Please continue: - Metformin ER 500 mg 2x a day - Farxiga 10 mg before b'fast - Trulicity 3 mg weekly  - Glipizide ER 5 mg in am - Glipizide 10 mg before b'fast and 5 mg before a larger lunch and 5 mg before a larger dinner  Please return in 4 months.   - we checked her HbA1c: 6.5% (lower) - advised to check sugars at different times of the day - 4x a day, rotating check times - advised for yearly eye exams >> she is UTD - return to clinic in 4 months  2. 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 HL -Reviewed latest lipid panel from 09/2021: LDL very high, triglycerides only slightly high: Lab Results  Component Value Date   CHOL 259 (H) 09/15/2021   HDL 64 09/15/2021   LDLCALC 167 (H) 09/15/2021   TRIG 156 (H) 09/15/2021   CHOLHDL 4.0 09/15/2021  -She had muscle aches and bone pain from statins and muscle pains from Zetia even at the low dose of 5 mg daily. -She is now seen in the lipid clinic.  - PAs for several cholesterol medications were denied A appeal was sent with information about her brother dying at 39 from massive heart attack and his father dying in his 7s from heart disease.  -As of now she is on co-Q10 and fish oil.  I suggested the portfolio diet, but she did not start.  Carlus Pavlov,  MD PhD [facility]

## 2022-05-11 LAB — POCT GLYCOSYLATED HEMOGLOBIN (HGB A1C): Hemoglobin A1C: 6.5 % — AB (ref 4.0–5.6)

## 2022-05-11 NOTE — Addendum Note (Signed)
Addended by: Lisabeth Pick on: 05/11/2022 07:02 AM   Modules accepted: Orders

## 2022-05-26 ENCOUNTER — Other Ambulatory Visit: Payer: Self-pay

## 2022-05-26 DIAGNOSIS — E1165 Type 2 diabetes mellitus with hyperglycemia: Secondary | ICD-10-CM

## 2022-05-26 MED ORDER — METFORMIN HCL ER 500 MG PO TB24: 1000.0000 mg | ORAL_TABLET | Freq: Two times a day (BID) | ORAL | 1 refills | Status: DC

## 2022-06-11 ENCOUNTER — Ambulatory Visit: Payer: Managed Care, Other (non HMO) | Admitting: Family

## 2022-06-28 ENCOUNTER — Other Ambulatory Visit: Payer: Self-pay | Admitting: Internal Medicine

## 2022-06-28 ENCOUNTER — Encounter: Payer: Self-pay | Admitting: *Deleted

## 2022-06-29 ENCOUNTER — Ambulatory Visit: Payer: Managed Care, Other (non HMO) | Admitting: Allergy & Immunology

## 2022-06-29 ENCOUNTER — Encounter: Payer: Self-pay | Admitting: Allergy & Immunology

## 2022-06-29 VITALS — BP 94/66 | HR 90 | Temp 97.7°F

## 2022-06-29 DIAGNOSIS — J4520 Mild intermittent asthma, uncomplicated: Secondary | ICD-10-CM

## 2022-06-29 DIAGNOSIS — J310 Chronic rhinitis: Secondary | ICD-10-CM

## 2022-06-29 MED ORDER — MONTELUKAST SODIUM 10 MG PO TABS: 10.0000 mg | ORAL_TABLET | Freq: Every day | ORAL | 3 refills | Status: DC

## 2022-06-29 MED ORDER — ALBUTEROL SULFATE HFA 108 (90 BASE) MCG/ACT IN AERS: 2.0000 | INHALATION_SPRAY | Freq: Four times a day (QID) | RESPIRATORY_TRACT | 2 refills | Status: DC | PRN

## 2022-06-29 MED ORDER — CETIRIZINE HCL 10 MG PO TABS: 10.0000 mg | ORAL_TABLET | Freq: Every day | ORAL | 3 refills | Status: DC

## 2022-06-29 MED ORDER — FLUTICASONE PROPIONATE 50 MCG/ACT NA SUSP: 2.0000 | Freq: Two times a day (BID) | NASAL | 3 refills | Status: DC

## 2022-06-29 NOTE — Patient Instructions (Addendum)
1. Non-allergic rhinitis - Continue with Flonase two sprays per nostril twice daily. - Continue with Singulair 10mg  daily. - Continue with Zyrtec 10mg  daily. - I tried to see in a 90-day supply of these.  2. Intermittent asthma, uncomplicated - Continue with albuterol as needed. - It does not seem that you need a controller medication at this time.  3. Return in about 1 year (around 06/30/2023).    Please inform us of any Emergency Department visits, hospitalizations, or changes in symptoms. Call us before going to the ED for breathing or allergy symptoms since we might be able to fit you in for a sick visit. Feel free to contact us anytime with any questions, problems, or concerns.  It was a pleasure to meet you today!  Websites that have reliable patient information: 1. American Academy of Asthma, Allergy, and Immunology: www.aaaai.org 2. Food Allergy Research and Education (FARE): foodallergy.org 3. Mothers of Asthmatics: http://www.asthmacommunitynetwork.org 4. American College of Allergy, Asthma, and Immunology: www.acaai.org   COVID-19 Vaccine Information can be found at: ShippingScam.co.uk For questions related to vaccine distribution or appointments, please email [email or web address] or call [phone].   We realize that you might be concerned about having an allergic reaction to the COVID19 vaccines. To help with that concern, WE ARE OFFERING THE COVID19 VACCINES IN OUR OFFICE! Ask the front desk for dates!     "Like" [place] on Facebook and Instagram for our latest updates!      A healthy democracy works best when [employer] participate! Make sure you are registered to vote! If you have moved or changed any of your contact information, you will need to get this updated before voting!  In some cases, you MAY be able to register to vote online: [email or web address]

## 2022-06-29 NOTE — Progress Notes (Signed)
FOLLOW UP  Date of Service/Encounter:  06/29/22   Assessment:   Non-allergic rhinitis  Intermittent asthma, uncomplicated  Preference for holistic therapies  Plan/Recommendations:   1. Non-allergic rhinitis - Continue with Flonase two sprays per nostril twice daily. - Continue with Singulair 10mg  daily. - Continue with Zyrtec 10mg  daily. - I tried to see in a 90-day supply of these.  2. Intermittent asthma, uncomplicated - Continue with albuterol as needed. - It does not seem that you need a controller medication at this time.  3. Return in about 1 year (around 06/30/2023).    Subjective:   Charlotte Horn is a 61 y.o. female presenting today for follow up of No chief complaint on file.   Charlotte Horn has a history of the following: Patient Active Problem List   Diagnosis Date Noted   Bilateral temporomandibular joint pain 09/15/2021   Diabetic peripheral neuropathy associated with type 2 diabetes mellitus ([place]) 06/01/2021   GAD (generalized anxiety disorder) 09/03/2020   Psychophysiological insomnia 09/03/2020   Hypertension associated with diabetes ([place]) 08/28/2019   Chronic sinusitis 08/16/2018   Seasonal allergic rhinitis 08/16/2018   Well controlled type 2 diabetes mellitus with peripheral neuropathy ([place]) 06/10/2017   Combined hyperlipidemia associated with type 2 diabetes mellitus ([place]) 04/11/2017   Family history of early CAD 04/11/2017   Extrinsic asthma 01/18/2008    History obtained from: chart review and patient.  Charlotte Horn is a 61 y.o. female presenting for a follow up visit.  She was last seen in December 2022 by Dr. Simona Huh as a new patient.  At that time, she underwent testing that was positive only to [place].  Even intradermal testing was negative.  She had tried nasal Atrovent as well as Singulair and Allegra.  She was started on Astelin 1 to 2 sprays per nostril twice a day as well as Atrovent 1 to 2 sprays per nostril  twice a day.  She was continued on Singulair as well as an over-the-counter antihistamine.  Since the last visit, she has been fairly stable.   She did see Dr. Ward Givens in [place]. He did review some imaging and did a rhinoscopy that was unrevealing.   She had been on antibiotics for 6 weeks when she finally saw Dr. Ward Givens. She was told to get an MRI the next time that she got a sinus infection. She has not had a sinus infection since the last visit. But she spent June and July with her nose as a faucet. She switched from Allegra to Zyrtec which did help some. She also found a bottle of a steroid containing nasal spray. This helped tremendously. However, this caused her to start having issues with her blood sugars. She did tell that she had fluctuations with the regular use of the nasal spray. She ran out of the fluticasone around the middle week of September. She has not been on it since running out of it. She takes an Alka-Seltzer tablet at night.   Review of her history shows that she was tested originally back in the early 1990s. She was also tested in the early 2000s.   Otherwise, there have been no changes to her past medical history, surgical history, family history, or social history.    Review of Systems  Constitutional: Negative.  Negative for chills, fever, malaise/fatigue and weight loss.  HENT:  Positive for congestion and sinus pain. Negative for ear discharge and ear pain.   Eyes:  Negative for pain, discharge and redness.  Respiratory:  Negative for cough, sputum production, shortness of breath and wheezing.   Cardiovascular: Negative.  Negative for chest pain and palpitations.  Gastrointestinal:  Negative for abdominal pain, constipation, diarrhea, heartburn, nausea and vomiting.  Skin: Negative.  Negative for itching and rash.  Neurological:  Negative for dizziness and headaches.  Endo/Heme/Allergies:  Positive for environmental allergies. Does not bruise/bleed easily.        Objective:   Blood pressure 94/66, pulse 90, temperature 97.7 F (36.5 C), temperature source Temporal, SpO2 99 %. There is no height or weight on file to calculate BMI.    Physical Exam Vitals reviewed.  Constitutional:      Appearance: She is well-developed.  HENT:     Head: Normocephalic and atraumatic.     Right Ear: Tympanic membrane, ear canal and external ear normal.     Left Ear: Tympanic membrane, ear canal and external ear normal.     Nose: No nasal deformity, septal deviation, mucosal edema or rhinorrhea.     Right Turbinates: Enlarged, swollen and pale.     Left Turbinates: Enlarged, swollen and pale.     Right Sinus: No maxillary sinus tenderness or frontal sinus tenderness.     Left Sinus: No maxillary sinus tenderness or frontal sinus tenderness.     Mouth/Throat:     Mouth: Mucous membranes are not pale and not dry.     Pharynx: Uvula midline.  Eyes:     General: Lids are normal. No allergic shiner.       Right eye: No discharge.        Left eye: No discharge.     Conjunctiva/sclera: Conjunctivae normal.     Right eye: Right conjunctiva is not injected. No chemosis.    Left eye: Left conjunctiva is not injected. No chemosis.    Pupils: Pupils are equal, round, and reactive to light.  Cardiovascular:     Rate and Rhythm: Normal rate and regular rhythm.     Heart sounds: Normal heart sounds.  Pulmonary:     Effort: Pulmonary effort is normal. No tachypnea, accessory muscle usage or respiratory distress.     Breath sounds: Normal breath sounds. No wheezing, rhonchi or rales.  Chest:     Chest wall: No tenderness.  Lymphadenopathy:     Cervical: No cervical adenopathy.  Skin:    Coloration: Skin is not pale.     Findings: No abrasion, erythema, petechiae or rash. Rash is not papular, urticarial or vesicular.  Neurological:     Mental Status: She is alert.  Psychiatric:        Behavior: Behavior is cooperative.      Diagnostic studies:  none       Malachi Bonds, MD  [facility]

## 2022-06-30 ENCOUNTER — Encounter: Payer: Self-pay | Admitting: Allergy & Immunology

## 2022-07-10 ENCOUNTER — Other Ambulatory Visit: Payer: Self-pay | Admitting: Internal Medicine

## 2022-08-19 ENCOUNTER — Other Ambulatory Visit (HOSPITAL_BASED_OUTPATIENT_CLINIC_OR_DEPARTMENT_OTHER): Payer: Self-pay

## 2022-08-19 MED ORDER — FLUARIX QUADRIVALENT 0.5 ML IM SUSY
PREFILLED_SYRINGE | INTRAMUSCULAR | 0 refills | Status: DC
  Filled 2022-08-19: qty 0.5, 1d supply, fill #0

## 2022-08-19 MED ORDER — COMIRNATY 30 MCG/0.3ML IM SUSY
PREFILLED_SYRINGE | INTRAMUSCULAR | 0 refills | Status: DC
  Filled 2022-08-19: qty 0.3, 1d supply, fill #0

## 2022-08-28 ENCOUNTER — Other Ambulatory Visit: Payer: Self-pay | Admitting: Family Medicine

## 2022-09-03 ENCOUNTER — Other Ambulatory Visit: Payer: Self-pay | Admitting: Internal Medicine

## 2022-09-03 DIAGNOSIS — E1142 Type 2 diabetes mellitus with diabetic polyneuropathy: Secondary | ICD-10-CM

## 2022-09-08 LAB — HM DIABETES EYE EXAM

## 2022-09-10 ENCOUNTER — Encounter: Payer: Self-pay | Admitting: Internal Medicine

## 2022-09-14 ENCOUNTER — Encounter: Payer: Self-pay | Admitting: Internal Medicine

## 2022-09-14 ENCOUNTER — Ambulatory Visit: Payer: Managed Care, Other (non HMO) | Admitting: Internal Medicine

## 2022-09-14 VITALS — BP 124/82 | HR 80 | Ht 69.0 in | Wt 150.8 lb

## 2022-09-14 DIAGNOSIS — E1142 Type 2 diabetes mellitus with diabetic polyneuropathy: Secondary | ICD-10-CM

## 2022-09-14 DIAGNOSIS — E7800 Pure hypercholesterolemia, unspecified: Secondary | ICD-10-CM | POA: Diagnosis not present

## 2022-09-14 LAB — POCT GLYCOSYLATED HEMOGLOBIN (HGB A1C): Hemoglobin A1C: 6.4 % — AB (ref 4.0–5.6)

## 2022-09-14 NOTE — Patient Instructions (Addendum)
Please continue: - Metformin ER 500 mg 2x a day - Farxiga 10 mg before b'fast - Trulicity 3 mg weekly  - Glipizide ER 5 mg in am - Glipizide 10 mg before b'fast and 5 mg before a larger lunch or a larger dinner  Please return in 6 months.

## 2022-09-14 NOTE — Progress Notes (Signed)
Patient ID: Charlotte Horn, female   DOB: [birth year], 61 y.o.   MRN: [id number]   HPI: Charlotte Horn is a 61 y.o.-year-old female,  returning for follow-up for DM2, dx in 2004, non-insulin-dependent, uncontrolled, without long-term complications.  Last visit 4 months ago.  Interim history: She continues to work from home (her company will not go back in person).  She had increased stress since last visit after her husband fell and tore muscles.  She had to help him with all his activities, including waking up with him at night.  She is exhausted. No increased urination, blurry vision, nausea, chest pain.  Reviewed HbA1c levels: Lab Results  Component Value Date   HGBA1C 6.5 (A) 05/11/2022   HGBA1C 6.6 (A) 09/15/2021   HGBA1C 6.1 (A) 06/01/2021  02/15/2018: HbA1c 7.0% 05/12/2017: HbA1c 9.1% 02/01/2017: HbA1c 7.0%  She is on: - Metformin ER 1000 >> 500 mg 2x a day (decreased 2/2 stomach pbs)  - Glipizide XL 5 mg before b'fast  - Glipizide 5 >> 10 mg before b'fast (+/- 5 mg before a larger lunch) >> 10 mg before breakfast and 5 mg before a larger lunch or dinner. - Farxiga 5 mg in am-added 05/2020 >> 10 mg daily - Trulicity 1.5 >> 3 mg weekly  She tried Byetta, Bydureon (1 year) - but had diarrhea. She had very low CBGs with regular Glipizide.  Also, she had hot flashes from the glipizide especially after taking a hot shower.  A freestyle libre 2 PA was recently denied by insurance on 09/17/2021. She is paying for it out of pocket. She checks her sugars more than 4 times a day with her CGM:     Previously:     Previously:   Lowest sugar was 45 (lowest ever) ...  >> 60s >> 60 >> upper 60s; she has hypoglycemia awareness in the 60s. Highest sugar was 350 (eating out) >> upper 200s >> 220 >> 280.  Glucometer: One Touch Ultra mini  Pt's meals are: - Breakfast: b'fast bar (<15g carbs), almonds, cereal >> yoghurt + granola, nuts or skips - Lunch:  veggies,  popcorn - Dinner: meat + veggies + rarely starch Diet sodas usually lower her sugars.  No CKD: Lab Results  Component Value Date   BUN 13 09/22/2021   BUN 13 09/15/2021   CREATININE 0.82 09/22/2021   CREATININE 0.76 09/15/2021  08/28/2019: CMP with slightly elevated glucose at 115, BUN/creatinine 10/0.61, GFR 101, ACR <7 02/01/2017: Glucose 185, BUN/creatinine 14/0.73, GFR 93. On losartan.  She has hyperlipidemia: Lab Results  Component Value Date   CHOL 259 (H) 09/15/2021   HDL 64 09/15/2021   LDLCALC 167 (H) 09/15/2021   TRIG 156 (H) 09/15/2021   CHOLHDL 4.0 09/15/2021  Previously:  08/28/2019: 240/159/57/154 02/01/2017: 253/166/59/161  On co-Q10 and fish oil. Nexletol PA was started by the lipid clinic >> denied.  Also, a PCSK9 inhibitor was denied by her insurance. She is now seen in the lipid clinic.  - last eye exam was 09/08/2022: No DR. Dr. Hazle Quant.  - +  numbness and tingling in her feet.  She is on B complex and alpha lipoic acid.  Last foot exam 09/15/2021.  On ASA 81.  Pt has FH of DM in father, PGM, 2 brothers, nephews  She works at [employer].  ROS: + See HPI  I reviewed pt's medications, allergies, PMH, social hx, family hx, and changes were documented in the history of present illness. Otherwise, unchanged from my initial  visit note.  Past Medical History:  Diagnosis Date   Allergy    Anxiety    Asthma    Deviated septum 08/16/2018   Diabetes mellitus without complication (HCC)    Family history of early CAD 04/11/2017   Hyperlipidemia 04/11/2017   Hypertension    PSx H: - multiple eye Sx's: [id number] - C-section 1996 - Gall Bladder Sx 1984  Social History   Social History   Marital status: Married    Spouse name: N/A   Number of children: 1   Occupational History   Glass blower/designer   Social History Main Topics   Smoking status: Never Smoker   Smokeless tobacco: Never Used   Alcohol use No   Drug use: No   Current Outpatient  Medications on File Prior to Visit  Medication Sig Dispense Refill   albuterol (VENTOLIN HFA) 108 (90 Base) MCG/ACT inhaler Inhale 2 puffs into the lungs every 6 (six) hours as needed for wheezing or shortness of breath. 8 g 2   albuterol (VENTOLIN HFA) 108 (90 Base) MCG/ACT inhaler Inhale 2 puffs into the lungs every 6 (six) hours as needed for wheezing or shortness of breath. 8 g 2   Alpha-Lipoic Acid 100 MG CAPS Take 6 capsules by mouth 2 (two) times daily.     azelastine (ASTELIN) 0.1 % nasal spray Place 2 sprays into both nostrils 2 (two) times daily as needed for rhinitis. Use in each nostril as directed 90 mL 1   b complex vitamins capsule Take 1 capsule by mouth daily.     Berberine Chloride (BERBERINE HCI PO) Take by mouth.     cetirizine (ZYRTEC) 10 MG tablet Take 10 mg by mouth daily.     cetirizine (ZYRTEC) 10 MG tablet Take 1 tablet (10 mg total) by mouth daily. 90 tablet 3   cholecalciferol (VITAMIN D) 1000 units tablet Take 5,000 Units by mouth daily.     co-enzyme Q-10 50 MG capsule Take 100 mg by mouth daily.     Continuous Blood Gluc Sensor (FREESTYLE LIBRE 3 SENSOR) MISC 1 each by Does not apply route every 14 (fourteen) days. 6 each 3   COVID-19 mRNA vaccine 2023-2024 (COMIRNATY) syringe Inject into the muscle. 0.3 mL 0   Cranberry 125 MG TABS Take 1 tablet by mouth daily.     cyclobenzaprine (FLEXERIL) 5 MG tablet Take 1 tablet (5 mg total) by mouth 3 (three) times daily as needed for muscle spasms. 30 tablet 1   diclofenac (VOLTAREN) 75 MG EC tablet Take 1 tablet (75 mg total) by mouth 2 (two) times daily. 30 tablet 0   Dulaglutide (TRULICITY) 3 MG/0.5ML SOPN INJECT THE CONTENTS OF ONE PEN  SUBCUTANEOUSLY WEEKLY AS  DIRECTED 6 mL 0   FARXIGA 10 MG TABS tablet TAKE 1 TABLET(10 MG) BY MOUTH DAILY BEFORE BREAKFAST 90 tablet 3   fluconazole (DIFLUCAN) 150 MG tablet Take 1 tablet (150 mg total) by mouth every three (3) days as needed. 2 tablet 0   fluticasone (FLONASE) 50 MCG/ACT  nasal spray Place 2 sprays into both nostrils in the morning and at bedtime. 48 g 3   glipiZIDE (GLUCOTROL XL) 5 MG 24 hr tablet Take 1 tablet (5 mg total) by mouth daily. 90 tablet 3   glipiZIDE (GLUCOTROL) 5 MG tablet Take up to 4 tabs by mouth a day as advised 360 tablet 3   glucose blood (FREESTYLE TEST STRIPS) test strip Use once a day - for freestyle libre 100 each  3   influenza vac split quadrivalent PF (FLUARIX QUADRIVALENT) 0.5 ML injection Inject into the muscle. 0.5 mL 0   losartan (COZAAR) 100 MG tablet TAKE 1 TABLET(100 MG) BY MOUTH DAILY 90 tablet 3   Magnesium Oxide (MAG-200 PO) Take 1 tablet by mouth daily.     metFORMIN (GLUCOPHAGE-XR) 500 MG 24 hr tablet Take 2 tablets (1,000 mg total) by mouth 2 (two) times daily. 360 tablet 1   montelukast (SINGULAIR) 10 MG tablet Take 1 tablet (10 mg total) by mouth at bedtime. 90 tablet 3   Multiple Vitamin (THERA) TABS Take 1 tablet by mouth daily.     Omega-3 Fatty Acids (FISH OIL) 1000 MG CAPS Take 2 capsules by mouth daily.      PREVIDENT 5000 BOOSTER PLUS 1.1 % PSTE SMARTSIG:To Teeth PRN     sertraline (ZOLOFT) 100 MG tablet TAKE 1 TABLET(100 MG) BY MOUTH DAILY 90 tablet 3   traZODone (DESYREL) 100 MG tablet Take 1 tablet (100 mg total) by mouth at bedtime. 90 tablet 3   No current facility-administered medications on file prior to visit.   Allergies  Allergen Reactions   Humalog [Insulin Lispro] Other (See Comments)    Muscle cramps   Statins Other (See Comments)    MUSCLE ACHES   Prednisone    Family History  Problem Relation Age of Onset   Ovarian cancer Mother    Arthritis Mother    Cancer Mother    Depression Mother    Heart attack Father    Kidney failure Father    Heart disease Father    Hyperlipidemia Father    Kidney disease Father    Diabetes Father    Sudden Cardiac Death Brother    Alcohol abuse Brother    Asthma Brother    Diabetes Brother    Early death Brother    Heart disease Brother     Hyperlipidemia Brother    Hypertension Brother    Heart attack Brother    Ovarian cancer Maternal Grandmother    Arthritis Maternal Grandmother    Cancer Maternal Grandmother    COPD Maternal Grandmother    Parkinson's disease Maternal Grandfather    Heart failure Paternal Grandmother    Diabetes Brother    Heart disease Brother    Hyperlipidemia Brother    Breast cancer Neg Hx    PE: BP 124/82 (BP Location: Right Arm, Patient Position: Sitting, Cuff Size: Normal)   Pulse 80   Ht 5\' 9"  (1.753 m)   Wt 150 lb 12.8 oz (68.4 kg)   SpO2 99%   BMI 22.27 kg/m   Wt Readings from Last 3 Encounters:  09/14/22 150 lb 12.8 oz (68.4 kg)  05/10/22 147 lb (66.7 kg)  12/07/21 142 lb 4 oz (64.5 kg)   Constitutional: normal weight, in NAD Eyes: no exophthalmos ENT: no masses palpated in neck, no cervical lymphadenopathy Cardiovascular: RRR, No MRG Respiratory: CTA B Musculoskeletal: no deformities Skin: no rashes Neurological: no tremor with outstretched hands Diabetic Foot Exam - Simple   Simple Foot Form Diabetic Foot exam was performed with the following findings: Yes 09/14/2022  4:24 PM  Visual Inspection No deformities, no ulcerations, no other skin breakdown bilaterally: Yes Sensation Testing Intact to touch and monofilament testing bilaterally: Yes Pulse Check Posterior Tibialis and Dorsalis pulse intact bilaterally: Yes Comments     ASSESSMENT: 1. 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 HL  PLAN:  1.  Patient with longstanding, previously uncontrolled type 2 diabetes, improved, on a complex medication regimen with metformin, SGLT2 inhibitor and sulfonylurea along with weekly GLP-1 receptor agonist.  She has to use a lower dose of metformin due to history of diarrhea.  At last visit, she was tells me that the CGM interpretation is not covered by her insurance.  She printed out her CGM data for the previous week and  for the previous 3 months.  Sugars appears to be improved in the previous 2 weeks after she started berberine and changed her apple cider vinegar capsules.  Sugars were still higher after breakfast and lunch but they were much better after dinner.  She added back glipizide extended release.  We did not change her regimen but I did advise her to try to exercise in the morning.  She was only playing with her dogs and walking at lunchtime and afterward we discussed about doing some cardio exercise in the morning.  She was not having a large breakfast, only a handful of nuts, but blood sugars were still increasing, most likely due to the dawn phenomenon/stress.  HbA1c at last visit was 6.5%, slightly lower. -At today's visit, sugars appear to be approximately the same as before on average over the last 3 months, with higher blood sugars after meals especially after lunch.  She only occasionally has blood sugars above 200s.  In the last week, she has had more pronounced spikes, possibly due to the holidays.  She also noticed that she was given a different glipizide tablet from the pharmacy, made by a different manufacturer and sugars were higher when she was using this.  She would like to switch manufacturers and we discussed with the pharmacy.  She will let me know if she wants me to send a prescription for the glipizide IR to the mail order pharmacy. - I suggested to:  Patient Instructions  Please continue: - Metformin ER 500 mg 2x a day - Farxiga 10 mg before b'fast - Trulicity 3 mg weekly  - Glipizide ER 5 mg in am - Glipizide 10 mg before b'fast and 5 mg before a larger lunch and 5 mg before a larger dinner  Please return in 4-6 months.   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 HL -Reviewed latest lipid panel from 09/2021: LDL very high, triglycerides only slightly high: Lab Results  Component Value Date   CHOL 259 (H) 09/15/2021   HDL 64 09/15/2021   LDLCALC 167 (H) 09/15/2021   TRIG 156 (H) 09/15/2021   CHOLHDL 4.0 09/15/2021  -She had muscle aches and bone pain from statins and muscle pains from Zetia even at the low dose of 5 mg daily. -She started to go to the lipid clinic -on co-Q10 and fish oil.  She has not been seen lately, as she was very busy with her husband. -PAs for several cholesterol medications were denied - an appeal was sent with information about her brother dying at 25 from massive heart attack and his father dying in his 11s from heart disease.   Carlus Pavlov, MD PhD [facility]

## 2022-09-16 ENCOUNTER — Encounter: Payer: Self-pay | Admitting: Family Medicine

## 2022-09-16 ENCOUNTER — Other Ambulatory Visit (HOSPITAL_BASED_OUTPATIENT_CLINIC_OR_DEPARTMENT_OTHER): Payer: Self-pay

## 2022-09-16 ENCOUNTER — Ambulatory Visit (INDEPENDENT_AMBULATORY_CARE_PROVIDER_SITE_OTHER): Payer: Managed Care, Other (non HMO) | Admitting: Family Medicine

## 2022-09-16 VITALS — BP 92/61 | HR 88 | Temp 97.5°F | Ht 69.0 in | Wt 147.4 lb

## 2022-09-16 DIAGNOSIS — E782 Mixed hyperlipidemia: Secondary | ICD-10-CM

## 2022-09-16 DIAGNOSIS — E1142 Type 2 diabetes mellitus with diabetic polyneuropathy: Secondary | ICD-10-CM

## 2022-09-16 DIAGNOSIS — F411 Generalized anxiety disorder: Secondary | ICD-10-CM | POA: Diagnosis not present

## 2022-09-16 DIAGNOSIS — E1169 Type 2 diabetes mellitus with other specified complication: Secondary | ICD-10-CM | POA: Diagnosis not present

## 2022-09-16 DIAGNOSIS — E559 Vitamin D deficiency, unspecified: Secondary | ICD-10-CM

## 2022-09-16 DIAGNOSIS — F5104 Psychophysiologic insomnia: Secondary | ICD-10-CM | POA: Diagnosis not present

## 2022-09-16 DIAGNOSIS — Z0000 Encounter for general adult medical examination without abnormal findings: Secondary | ICD-10-CM | POA: Diagnosis not present

## 2022-09-16 DIAGNOSIS — I959 Hypotension, unspecified: Secondary | ICD-10-CM

## 2022-09-16 MED ORDER — LOSARTAN POTASSIUM 100 MG PO TABS: 50.0000 mg | ORAL_TABLET | Freq: Every day | ORAL | 3 refills | Status: DC

## 2022-09-16 MED ORDER — AREXVY 120 MCG/0.5ML IM SUSR
INTRAMUSCULAR | 0 refills | Status: DC
  Filled 2022-09-16: qty 0.5, 1d supply, fill #0

## 2022-09-16 NOTE — Addendum Note (Signed)
Addended by: Lorn Junes on: 09/16/2022 09:42 AM   Modules accepted: Orders

## 2022-09-16 NOTE — Patient Instructions (Signed)
Please return in 12 months for your annual complete physical; please come fasting. Sooner if need help with blood pressure control.   Check home readings: we want them between 90/60 and 130/80 consistently. Cut your losartan to 50mg  daily.   I will release your lab results to you on your MyChart account with further instructions. You may see the results before I do, but when I review them I will send you a message with my report or have my assistant call you if things need to be discussed. Please reply to my message with any questions. Thank you!   If you have any questions or concerns, please don't hesitate to send me a message via MyChart or call the office at [phone]. Thank you for visiting with [phone] today! It's our pleasure caring for you.

## 2022-09-16 NOTE — Progress Notes (Signed)
Subjective  Chief Complaint  Patient presents with   Annual Exam    Pt is fasting    HPI: Charlotte Horn is a 61 y.o. female who presents to [facility] today for a Female Wellness Visit. She also has the concerns and/or needs as listed above in the chief complaint. These will be addressed in addition to the Health Maintenance Visit.   Wellness Visit: annual visit with health maintenance review and exam without Pap Health maintenance: Screens are current.  Colorectal cancer screening will be due in July with colonoscopy.  Sees GYN for female wellness.  Overall doing well.  Working from home.  Unfortunately, her husband had a significant traumatic accident and over the last 4 months she has been at home caring for him.  He is recovering well but it has been a stressful time.  Chronic disease f/u and/or acute problem visit: (deemed necessary to be done in addition to the wellness visit): Diabetes with neuropathy: Reviewed notes from endocrinology from 2 days ago.  Complex medical regimen but controlled.  She takes losartan 100 mg daily for renal protection however blood pressure is low today.  She does admit to some orthostatic hypotensive symptoms.  This happens intermittently.  No palpitations.  She has been on this dose for several years.  She is also on [place] for renal protection.  Eye exam is current without retinopathy. Hyperlipidemia intolerant to statins and Zetia but did not qualify for Repatha.  Fasting for recheck today.  Eats a healthy diet Insomnia has been controlled on trazodone although it has been difficult since caring for her husband, she has been up in the middle of the night often.  She does admit to feeling fatigued Vitamin D deficiency last year levels were running in the 90s.  She was taking 5000 units daily and was instructed to cut back.  However she was taking 5000 weekly and would not get this and often so has been off vitamin D for a  while now.  Wonders if levels are low again.  No muscle cramping Anxiety is well-controlled on sertraline daily Asthma is well-controlled allergies are well-controlled.  I reviewed recent asthma visit.  Blood pressure was low at that time as well  Assessment  1. Annual physical exam   2. 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 Combined hyperlipidemia associated with type 2 diabetes mellitus ([place])   4. GAD (generalized anxiety disorder)   5. Psychophysiological insomnia   6. Diabetic peripheral neuropathy associated with type 2 diabetes mellitus ([place])   7. Hypotension, unspecified hypotension type   8. Vitamin D deficiency      Plan  Female Wellness Visit: Age appropriate Health Maintenance and Prevention measures were discussed with patient. Included topics are cancer screening recommendations, ways to keep healthy (see AVS) including dietary and exercise recommendations, regular eye and dental care, use of seat belts, and avoidance of moderate alcohol use and tobacco use.  Screening for colorectal cancer due in July.  Patient aware BMI: discussed patient's BMI and encouraged positive lifestyle modifications to help get to or maintain a target BMI. HM needs and immunizations were addressed and ordered. See below for orders. See HM and immunization section for updates.  Up-to-date Routine labs and screening tests ordered including cmp, cbc and lipids where appropriate. Discussed recommendations regarding Vit D and calcium supplementation (see AVS)  Chronic disease management visit and/or acute problem visit: Diabetes per endocrinology.  Well-controlled.  But now with low blood pressure and some symptoms, will cut losartan to 50 mg daily and she will start home monitoring.  Educated.  Check renal function, electrolytes and urine nephropathy screen Hyperlipidemia: Fasting for recheck today. Continue sertraline 50 mg daily for anxiety.  This is working  well Continue trazodone 25 to 50 mg nightly for insomnia.  Refilled today Recheck vitamin D levels, and reinstitute vitamin D 10-1998 daily if low He has many allergies are controlled  Follow up: 12 months for CPE. Orders Placed This Encounter  Procedures   Microalbumin / creatinine urine ratio   CBC with Differential/Platelet   Comprehensive metabolic panel   Lipid panel   TSH   VITAMIN D 25 Hydroxy (Vit-D Deficiency, Fractures)   Meds ordered this encounter  Medications   losartan (COZAAR) 100 MG tablet    Sig: Take 0.5 tablets (50 mg total) by mouth daily.    Dispense:  90 tablet    Refill:  3      Body mass index is 21.77 kg/m. 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 ([place]) 06/10/2017    Priority: High   Combined hyperlipidemia associated with type 2 diabetes mellitus ([place]) 04/11/2017    Priority: High   Family history of early CAD 04/11/2017    Priority: High   Psychophysiological insomnia 09/03/2020    Priority: Medium    Seasonal allergic rhinitis 08/16/2018    Priority: Medium    Extrinsic asthma 01/18/2008    Priority: Medium    Bilateral temporomandibular joint pain 09/15/2021    Priority: Low   Chronic sinusitis 08/16/2018    Priority: [place] Maintenance  Topic Date Due   Diabetic kidney evaluation - Urine ACR  Never done   MAMMOGRAM  09/03/2021   Diabetic kidney evaluation - eGFR measurement  09/22/2022   HEMOGLOBIN A1C  03/16/2023   COLONOSCOPY (Pts 45-39yr Insurance coverage  will need to be confirmed)  04/04/2023   OPHTHALMOLOGY EXAM  09/09/2023   FOOT EXAM  09/15/2023   PAP SMEAR-Modifier  09/03/2024   DTaP/Tdap/Td (3 - Td or Tdap) 09/04/2027   INFLUENZA VACCINE  Completed   COVID-19 Vaccine  Completed   Hepatitis C Screening  Completed   Zoster Vaccines- Shingrix  Completed   HPV VACCINES  Aged Out   HIV Screening  Discontinued   Immunization History  Administered Date(s) Administered   COVID-19, mRNA, vaccine(Comirnaty)12 years and older 08/19/2022   Influenza,inj,Quad PF,6+ Mos 07/25/2017, 07/11/2018, 06/28/2019, 07/04/2022   Influenza-Unspecified 07/03/2020, 07/17/2021   PFIZER Comirnaty(Gray Top)Covid-19 Tri-Sucrose Vaccine 02/04/2021   PFIZER(Purple Top)SARS-COV-2 Vaccination 12/06/2019, 12/27/2019, 07/03/2020   Pneumococcal Conjugate-13 08/15/2016, 08/01/2018   Pneumococcal Polysaccharide-23 10/04/2006, 10/06/2015, 09/03/2020   Tdap 04/22/2015, 09/03/2017   Unspecified SARS-COV-2 Vaccination 12/07/2019, 12/22/2019, 06/04/2020   Zoster Recombinat (Shingrix) 07/01/2019, 03/04/2021   Zoster, Live 06/04/2009   We updated and reviewed the patient's past history in detail and it is documented below. Allergies: Patient is allergic to humalog [insulin lispro],  statins, and prednisone. Past Medical History Patient  has a past medical history of Allergy, Anxiety, Asthma, Deviated septum (08/16/2018), Diabetes mellitus without complication ([place]), Family history of early CAD (04/11/2017), Hyperlipidemia (04/11/2017), and Hypertension. Past Surgical History Patient  has a past surgical history that includes Cholecystectomy and Cesarean section. Family History: Patient family history includes Alcohol abuse in her brother; Arthritis in her maternal grandmother and mother; Asthma in her brother; COPD in her maternal grandmother; Cancer in her maternal grandmother and mother; Depression in her mother; Diabetes in her brother, brother, and father; Early death in her  brother; Heart attack in her brother and father; Heart disease in her brother, brother, and father; Heart failure in her paternal grandmother; Hyperlipidemia in her brother, brother, and father; Hypertension in her brother; Kidney disease in her father; Kidney failure in her father; Ovarian cancer in her maternal grandmother and mother; Parkinson's disease in her maternal grandfather; Sudden Cardiac Death in her brother. Social History:  Patient  reports that she has never smoked. She has never been exposed to tobacco smoke. She has never used smokeless tobacco. She reports current alcohol use. She reports that she does not use drugs.  Review of Systems: Constitutional: negative for fever or malaise Ophthalmic: negative for photophobia, double vision or loss of vision Cardiovascular: negative for chest pain, dyspnea on exertion, or new LE swelling Respiratory: negative for SOB or persistent cough Gastrointestinal: negative for abdominal pain, change in bowel habits or melena Genitourinary: negative for dysuria or gross hematuria, no abnormal uterine bleeding or disharge Musculoskeletal: negative for new gait disturbance or muscular weakness Integumentary: negative for new or persistent rashes, no breast lumps Neurological: negative for TIA or stroke symptoms Psychiatric: negative for SI or delusions Allergic/Immunologic: negative for hives  Patient Care Team    Relationship Specialty Notifications Start End  Leamon Arnt, MD PCP - General Family Medicine  09/03/20   Skeet Latch, MD PCP - Cardiology Cardiology  10/17/20   Delsa Bern, MD Consulting Physician Obstetrics and Gynecology  09/03/20   Philemon Kingdom, MD Consulting Physician Endocrinology  09/03/20   Pixie Casino, MD Consulting Physician Cardiology  09/03/20    Comment: lipids clinic  Skeet Latch, MD Consulting Physician Cardiology  09/03/20     Objective  Vitals: BP 92/61   Pulse 88   Temp (!) 97.5 F (36.4  C)   Ht _0  (1.753 m)   Wt 147 lb 6.4 oz (66.9 kg)   SpO2 99%   BMI 21.77 kg/m  General:  Well developed, well nourished, no acute distress  Psych:  Alert and orientedx3,normal mood and affect HEENT:  Normocephalic, atraumatic, non-icteric sclera,  supple neck without adenopathy, mass or thyromegaly Cardiovascular:  Normal S1, S2, RRR without gallop, rub or murmur Respiratory:  Good breath sounds bilaterally, CTAB with normal respiratory effort Gastrointestinal: normal bowel sounds, soft, non-tender, no noted masses. No HSM MSK: no deformities, contusions. Joints are without erythema or swelling.  Skin:  Warm, no rashes or suspicious lesions noted Neurologic:    Mental status is normal.  Gross motor and sensory exams are normal. Normal gait. No tremor   Commons side effects, risks, benefits, and alternatives for medications and treatment plan prescribed today were discussed, and the patient expressed understanding of the given instructions. Patient is instructed to call or message via MyChart if he/she has any questions or concerns regarding our treatment plan. No barriers to understanding were identified. We discussed Red Flag symptoms and signs in detail. Patient  expressed understanding regarding what to do in case of urgent or emergency type symptoms.  Medication list was reconciled, printed and provided to the patient in AVS. Patient instructions and summary information was reviewed with the patient as documented in the AVS. This note was prepared with assistance of Dragon voice recognition software. Occasional wrong-word or sound-a-like substitutions may have occurred due to the inherent limitations of voice recognition software

## 2022-09-17 LAB — COMPREHENSIVE METABOLIC PANEL
ALT: 15 IU/L (ref 0–32)
AST: 13 IU/L (ref 0–40)
Albumin/Globulin Ratio: 1.8 (ref 1.2–2.2)
Albumin: 4.2 g/dL (ref 3.9–4.9)
Alkaline Phosphatase: 70 IU/L (ref 44–121)
BUN/Creatinine Ratio: 25 (ref 12–28)
BUN: 17 mg/dL (ref 8–27)
Bilirubin Total: <0.2 mg/dL (ref 0.0–1.2)
CO2: 24 mmol/L (ref 20–29)
Calcium: 9.2 mg/dL (ref 8.7–10.3)
Chloride: 104 mmol/L (ref 96–106)
Creatinine, Ser: 0.69 mg/dL (ref 0.57–1.00)
Globulin, Total: 2.4 g/dL (ref 1.5–4.5)
Glucose: 138 mg/dL — ABNORMAL HIGH (ref 70–99)
Potassium: 5 mmol/L (ref 3.5–5.2)
Sodium: 143 mmol/L (ref 134–144)
Total Protein: 6.6 g/dL (ref 6.0–8.5)
eGFR: 99 mL/min/{1.73_m2} (ref >59)

## 2022-09-17 LAB — CBC WITH DIFFERENTIAL/PLATELET
Basophils Absolute: 0 10*3/uL (ref 0.0–0.2)
Basos: 1 %
EOS (ABSOLUTE): 0.5 10*3/uL — ABNORMAL HIGH (ref 0.0–0.4)
Eos: 10 %
Hematocrit: 41 % (ref 34.0–46.6)
Hemoglobin: 12.9 g/dL (ref 11.1–15.9)
Immature Grans (Abs): 0 10*3/uL (ref 0.0–0.1)
Immature Granulocytes: 0 %
Lymphocytes Absolute: 1.5 10*3/uL (ref 0.7–3.1)
Lymphs: 30 %
MCH: 27.6 pg (ref 26.6–33.0)
MCHC: 31.5 g/dL (ref 31.5–35.7)
MCV: 88 fL (ref 79–97)
Monocytes Absolute: 0.3 10*3/uL (ref 0.1–0.9)
Monocytes: 6 %
Neutrophils Absolute: 2.5 10*3/uL (ref 1.4–7.0)
Neutrophils: 53 %
Platelets: 288 10*3/uL (ref 150–450)
RBC: 4.68 x10E6/uL (ref 3.77–5.28)
RDW: 13.2 % (ref 11.7–15.4)
WBC: 4.8 10*3/uL (ref 3.4–10.8)

## 2022-09-17 LAB — TSH: TSH: 2.71 u[IU]/mL (ref 0.450–4.500)

## 2022-09-17 LAB — LIPID PANEL
Chol/HDL Ratio: 2.7 ratio (ref 0.0–4.4)
Cholesterol, Total: 171 mg/dL (ref 100–199)
HDL: 64 mg/dL (ref >39)
LDL Chol Calc (NIH): 90 mg/dL (ref 0–99)
Triglycerides: 93 mg/dL (ref 0–149)
VLDL Cholesterol Cal: 17 mg/dL (ref 5–40)

## 2022-09-17 LAB — MICROALBUMIN / CREATININE URINE RATIO
Creatinine, Urine: 88.6 mg/dL
Microalb/Creat Ratio: 9 mg/g creat (ref 0–29)
Microalbumin, Urine: 7.9 ug/mL

## 2022-09-17 LAB — HM MAMMOGRAPHY

## 2022-09-17 LAB — VITAMIN D 25 HYDROXY (VIT D DEFICIENCY, FRACTURES): Vit D, 25-Hydroxy: 51.6 ng/mL (ref 30.0–100.0)

## 2022-09-20 ENCOUNTER — Other Ambulatory Visit (HOSPITAL_COMMUNITY): Payer: Self-pay

## 2022-09-29 LAB — HM PAP SMEAR

## 2022-10-19 ENCOUNTER — Other Ambulatory Visit: Payer: Self-pay

## 2022-10-19 MED ORDER — GLIPIZIDE 5 MG PO TABS: ORAL_TABLET | ORAL | 1 refills | Status: DC

## 2022-10-22 ENCOUNTER — Other Ambulatory Visit: Payer: Self-pay | Admitting: Internal Medicine

## 2022-10-25 ENCOUNTER — Other Ambulatory Visit: Payer: Self-pay

## 2022-10-25 DIAGNOSIS — E1142 Type 2 diabetes mellitus with diabetic polyneuropathy: Secondary | ICD-10-CM

## 2022-10-25 MED ORDER — TRULICITY 3 MG/0.5ML ~~LOC~~ SOAJ: SUBCUTANEOUS | 2 refills | Status: DC

## 2022-11-15 ENCOUNTER — Telehealth: Payer: Self-pay | Admitting: Family Medicine

## 2022-11-15 NOTE — Telephone Encounter (Signed)
Pt went to [facility] urgent [facility] - no further action needed.    atient Name: Charlotte Horn Gender: Female DOB: 1961/04/23 Age: 62 Y 2 M 12 D Return Phone Number: [phone] (Primary) Address: City/ State/ Zip: [place]  [address] Client [facility] [place] Client Site [facility] Night Provider Billey Chang- MD Contact Type Call Who Is Calling Patient / Member / Family / Caregiver Call Type Triage / Clinical Relationship To Patient Self Return Phone Number [phone] (Primary) Chief Complaint Headache Reason for Call Symptomatic / Request for [place] states she just tested positive for covid. She has a stuffy head, sore throat, dry cough. Translation No Nurse Assessment Nurse: Lennice Sites, RN, Brandi Date/Time (Eastern Time): 11/13/2022 9:19:59 AM Confirm and document reason for call. If symptomatic, describe symptoms. ---Caller states she just tested positive for Covid 11/13/22. She has a stuffy head, sore throat, dry cough. NO fever. Symptoms started with sinus issues over the last 2 wks, but symptoms changed on Thursday. Does the patient have any new or worsening symptoms? ---Yes Will a triage be completed? ---Yes Related visit to physician within the last 2 weeks? ---No Does the PT have any chronic conditions? (i.e. diabetes, asthma, this includes High risk factors for pregnancy, etc.) ---Yes List chronic conditions. ---diabetic asthma; prn, montelukast daily elevated cholesterol Is this a behavioral health or substance abuse call? ---No Guidelines Guideline Title Affirmed Question Affirmed Notes Nurse Date/Time (Eastern Time) COVID-19 - Persisting Symptoms Follow-up Call [1] MILD difficulty breathing (e.g., minimal/no SOB at rest, SOB with walking, pulse <100) AND [2] new-onset Lennice Sites, RN, Brandi 11/13/2022 9:22:26 AM PLEASE NOTE: All timestamps  contained within this report are represented as [place] Standard Time. CONFIDENTIALTY NOTICE: This fax transmission is intended only for the addressee. It contains information that is legally privileged, confidential or otherwise protected from use or disclosure. If you are not the intended recipient, you are strictly prohibited from reviewing, disclosing, copying using or disseminating any of this information or taking any action in reliance on or regarding this information. If you have received this fax in error, please notify us immediately by telephone so that we can arrange for its return to [place]. Phone: [phone], Toll-Free: [phone], Fax: [phone] Page: 2 of 2 Call Id: [id number] [place]. Time Eilene Ghazi Time) Disposition Final User 11/13/2022 9:35:49 AM See HCP within 4 Hours (or PCP triage) Yes Lennice Sites, RN, Brandi Final Disposition 11/13/2022 9:35:49 AM See HCP within 4 Hours (or PCP triage) Yes Lennice Sites, RN, Berdie Ogren Disagree/Comply Comply Caller Understands Yes PreDisposition Call Doctor Care Advice Given Per Guideline SEE HCP (OR PCP TRIAGE) WITHIN 4 HOURS: * IF OFFICE WILL BE OPEN: You need to be seen within the next 3 or 4 hours. Call your doctor (or NP/PA) now or as soon as the office opens. CALL BACK IF: * You become worse After Care Instructions Given Call Event Type User Date / Time Description Education document email Alonna Minium 11/13/2022 9:39:07 AM Asthma Attack Referrals REFERRED TO PCP OFFICE

## 2022-11-20 ENCOUNTER — Other Ambulatory Visit: Payer: Self-pay | Admitting: Internal Medicine

## 2022-11-20 DIAGNOSIS — E1165 Type 2 diabetes mellitus with hyperglycemia: Secondary | ICD-10-CM

## 2022-12-06 ENCOUNTER — Other Ambulatory Visit: Payer: Self-pay | Admitting: Internal Medicine

## 2022-12-06 DIAGNOSIS — E1142 Type 2 diabetes mellitus with diabetic polyneuropathy: Secondary | ICD-10-CM

## 2022-12-24 ENCOUNTER — Ambulatory Visit: Payer: Managed Care, Other (non HMO) | Admitting: Family

## 2022-12-24 ENCOUNTER — Encounter: Payer: Self-pay | Admitting: Family

## 2022-12-24 VITALS — BP 119/58 | HR 86 | Temp 97.8°F | Ht 69.0 in | Wt 153.1 lb

## 2022-12-24 DIAGNOSIS — J209 Acute bronchitis, unspecified: Secondary | ICD-10-CM | POA: Diagnosis not present

## 2022-12-24 DIAGNOSIS — B3731 Acute candidiasis of vulva and vagina: Secondary | ICD-10-CM

## 2022-12-24 DIAGNOSIS — H7392 Unspecified disorder of tympanic membrane, left ear: Secondary | ICD-10-CM | POA: Diagnosis not present

## 2022-12-24 MED ORDER — PREDNISONE 20 MG PO TABS: ORAL_TABLET | ORAL | 0 refills | Status: DC

## 2022-12-24 MED ORDER — FLUCONAZOLE 150 MG PO TABS: ORAL_TABLET | ORAL | 0 refills | Status: DC

## 2022-12-24 NOTE — Progress Notes (Signed)
Patient ID: Charlotte Horn, female    DOB: 01/30/1961, 62 y.o.   MRN: [id number]  Chief Complaint  Patient presents with   Cough    sx for about a month   Vaginal Pain    sx for a week    HPI:   Cough:   persistent, just finished clindamycin for dental infection, which seemed to be helping her cough improve, but it is still persistent. Pt c/o chest & nasal congestion, nasal drainage, postnasal drip, some SOB for about a month. Has been using her inhaler which also helps a little.   Vaginal itching: Pt c/o burning, vaginal odor and discharge since using the antibiotic this week. pt was taking clindamycin and finished yesterday. Started feeling symptoms a few days ago, denies dysuria, urine odor, back pain or hematuria.  Assessment & Plan:  1. Acute bronchitis, unspecified organism - lungs ok, but she is unable to take deep breaths d/t cough - pt is gagging and clearing her throat constantly during visit d/t mucus. Sending low dose prednisone, no allergy, pt reports it shoots her BS up, advised to monitor BS closely, ok to take an extra Metformin or Glipizide IR prn, pt has done this in past thru her Endo provider. Also advised to increase her oral antihistamine to bid, prefer to also increase Flonase, but pt says this also raises her BS. Advised to wait until off the prednisone then increase Flonase to at least 1 squirt each side qd for 1 week, then qod along with oral antihistamine. States she is taking Singulair qd. May need steroid inhaler if sx not improved after pred., advised pt to call back next week.  - predniSONE (DELTASONE) 20 MG tablet; Take 2 pills in the morning with breakfast for 3 days, then 1 pill for 3 days  Dispense: 9 tablet; Refill: 0  2. Vaginal yeast infection - sending diflucan, advised on use & SE.  - fluconazole (DIFLUCAN) 150 MG tablet; Take 1 pill today and the 2nd pill in 3 days.  Dispense: 2 tablet; Refill: 0   Subjective:    Outpatient Medications  Prior to Visit  Medication Sig Dispense Refill   albuterol (VENTOLIN HFA) 108 (90 Base) MCG/ACT inhaler Inhale 2 puffs into the lungs every 6 (six) hours as needed for wheezing or shortness of breath. 8 g 2   Alpha-Lipoic Acid 100 MG CAPS Take 6 capsules by mouth 2 (two) times daily.     azelastine (ASTELIN) 0.1 % nasal spray Place 2 sprays into both nostrils 2 (two) times daily as needed for rhinitis. Use in each nostril as directed 90 mL 1   b complex vitamins capsule Take 1 capsule by mouth daily.     Berberine Chloride (BERBERINE HCI PO) Take by mouth.     co-enzyme Q-10 50 MG capsule Take 100 mg by mouth daily.     Continuous Blood Gluc Sensor (FREESTYLE LIBRE 3 SENSOR) MISC APPLY EVERY 14 (FOURTEEN) DAYS. 6 each 3   Cranberry 125 MG TABS Take 1 tablet by mouth daily.     cyclobenzaprine (FLEXERIL) 5 MG tablet Take 1 tablet (5 mg total) by mouth 3 (three) times daily as needed for muscle spasms. 30 tablet 1   Dulaglutide (TRULICITY) 3 0000000 SOPN INJECT 3 MG(0.5MLS) INTO THE SKIN ONCE WEEKLY 6 mL 2   FARXIGA 10 MG TABS tablet TAKE 1 TABLET(10 MG) BY MOUTH DAILY BEFORE BREAKFAST 90 tablet 3   fluticasone (FLONASE) 50 MCG/ACT nasal spray Place 2 sprays  into both nostrils in the morning and at bedtime. 48 g 3   glipiZIDE (GLUCOTROL XL) 5 MG 24 hr tablet Take 1 tablet (5 mg total) by mouth daily. 90 tablet 3   glipiZIDE (GLUCOTROL) 5 MG tablet Take up to 4 tabs by mouth a day as advised 360 tablet 1   glucose blood (FREESTYLE TEST STRIPS) test strip Use once a day - for freestyle libre 100 each 3   losartan (COZAAR) 100 MG tablet Take 0.5 tablets (50 mg total) by mouth daily. 90 tablet 3   Magnesium Oxide (MAG-200 PO) Take 1 tablet by mouth daily.     metFORMIN (GLUCOPHAGE-XR) 500 MG 24 hr tablet TAKE 2 TABLETS(1000 MG) BY MOUTH TWICE DAILY 360 tablet 1   Multiple Vitamin (THERA) TABS Take 1 tablet by mouth daily.     Omega-3 Fatty Acids (FISH OIL) 1000 MG CAPS Take 2 capsules by mouth daily.       PREVIDENT 5000 BOOSTER PLUS 1.1 % PSTE SMARTSIG:To Teeth PRN     RSV vaccine recomb adjuvanted (AREXVY) 120 MCG/0.5ML injection Inject into the muscle. 0.5 mL 0   sertraline (ZOLOFT) 100 MG tablet TAKE 1 TABLET(100 MG) BY MOUTH DAILY 90 tablet 3   traZODone (DESYREL) 100 MG tablet Take 1 tablet (100 mg total) by mouth at bedtime. 90 tablet 3   cetirizine (ZYRTEC) 10 MG tablet Take 1 tablet (10 mg total) by mouth daily. 90 tablet 3   montelukast (SINGULAIR) 10 MG tablet Take 1 tablet (10 mg total) by mouth at bedtime. 90 tablet 3   No facility-administered medications prior to visit.   Past Medical History:  Diagnosis Date   Allergy    Anxiety    Asthma    Deviated septum 08/16/2018   Diabetes mellitus without complication [facility])    Family history of early CAD 04/11/2017   Hyperlipidemia 04/11/2017   Hypertension    Past Surgical History:  Procedure Laterality Date   CESAREAN SECTION     CHOLECYSTECTOMY     Allergies  Allergen Reactions   Humalog [Insulin Lispro] Other (See Comments)    Muscle cramps   Statins Other (See Comments)    MUSCLE ACHES   Prednisone       Objective:    Physical Exam Vitals and nursing note reviewed.  Constitutional:      Appearance: Normal appearance.  HENT:     Mouth/Throat:     Mouth: Mucous membranes are moist.     Pharynx: Oropharyngeal exudate and posterior oropharyngeal erythema (mild) present. No pharyngeal swelling or uvula swelling.     Tonsils: No tonsillar exudate or tonsillar abscesses.  Cardiovascular:     Rate and Rhythm: Normal rate and regular rhythm.  Pulmonary:     Effort: Pulmonary effort is normal. No respiratory distress.     Breath sounds: Normal breath sounds. Transmitted upper airway sounds present.  Musculoskeletal:        General: Normal range of motion.  Lymphadenopathy:     Head:     Right side of head: No submandibular, preauricular, posterior auricular or occipital adenopathy.     Left side of head: No  submandibular, preauricular, posterior auricular or occipital adenopathy.     Cervical: Cervical adenopathy present.     Right cervical: No superficial cervical adenopathy.    Left cervical: Superficial cervical adenopathy present.     Upper Body:     Right upper body: No supraclavicular adenopathy.     Left upper body: No supraclavicular  adenopathy.  Skin:    General: Skin is warm and dry.  Neurological:     Mental Status: She is alert.  Psychiatric:        Mood and Affect: Mood normal.        Behavior: Behavior normal.    BP (!) 119/58 (BP Location: Left Arm, Patient Position: Sitting, Cuff Size: Normal)   Pulse 86   Temp 97.8 F (36.6 C) (Temporal)   Ht 5\' 9"  (1.753 m)   Wt 153 lb 2 oz (69.5 kg)   SpO2 98%   BMI 22.61 kg/m  Wt Readings from Last 3 Encounters:  12/24/22 153 lb 2 oz (69.5 kg)  09/16/22 147 lb 6.4 oz (66.9 kg)  09/14/22 150 lb 12.8 oz (68.4 kg)     Jeanie Sewer, NP

## 2022-12-24 NOTE — Patient Instructions (Addendum)
It was very nice to see you today!   I am sending low dose prednisone to take to help suppress your symptoms. Remember to keep a close eye on your blood sugar, adjust your diabetic meds as needed to keep BS down. Continue to use your inhaler at least 3 times per day.  Let me know if your breathing gets harder or you feel more short of breath as a steroid inhaler can help. OK to take an oral antihistamine twice a day for 1-2 weeks to get symptoms under control and then reduce to daily use. When off the prednisone, increase your steroid nasal spray to 1 squirt each nostril every other day, or even daily, very little of this is absorbed into the bloodstream. Use a saline nasal spray prior to the steroid spray and as needed to disinfect your sinuses. Hydrate well, at least 2 liters = 8 cups of water daily!  Call back next week if symptoms are still not better, also need to check your left ear for healing in about 2 weeks.     PLEASE NOTE:  If you had any lab tests please let us know if you have not heard back within a few days. You may see your results on MyChart before we have a chance to review them but we will give you a call once they are reviewed by [place]. If we ordered any referrals today, please let us know if you have not heard from their office within the next week.

## 2022-12-27 ENCOUNTER — Other Ambulatory Visit: Payer: Self-pay

## 2022-12-27 DIAGNOSIS — E1142 Type 2 diabetes mellitus with diabetic polyneuropathy: Secondary | ICD-10-CM

## 2022-12-27 MED ORDER — FARXIGA 10 MG PO TABS: 10.0000 mg | ORAL_TABLET | Freq: Every day | ORAL | 2 refills | Status: DC

## 2023-02-01 ENCOUNTER — Other Ambulatory Visit: Payer: Self-pay | Admitting: Allergy & Immunology

## 2023-03-28 LAB — HEMOGLOBIN A1C: Hemoglobin A1C: 6.2

## 2023-03-29 ENCOUNTER — Encounter: Payer: Self-pay | Admitting: Internal Medicine

## 2023-03-29 ENCOUNTER — Ambulatory Visit: Payer: Managed Care, Other (non HMO) | Admitting: Internal Medicine

## 2023-03-29 VITALS — BP 124/74 | HR 79 | Ht 69.0 in | Wt 151.4 lb

## 2023-03-29 DIAGNOSIS — E7800 Pure hypercholesterolemia, unspecified: Secondary | ICD-10-CM

## 2023-03-29 DIAGNOSIS — E1165 Type 2 diabetes mellitus with hyperglycemia: Secondary | ICD-10-CM | POA: Diagnosis not present

## 2023-03-29 DIAGNOSIS — Z7985 Long-term (current) use of injectable non-insulin antidiabetic drugs: Secondary | ICD-10-CM

## 2023-03-29 DIAGNOSIS — E1142 Type 2 diabetes mellitus with diabetic polyneuropathy: Secondary | ICD-10-CM | POA: Diagnosis not present

## 2023-03-29 DIAGNOSIS — Z7984 Long term (current) use of oral hypoglycemic drugs: Secondary | ICD-10-CM

## 2023-03-29 LAB — POCT GLYCOSYLATED HEMOGLOBIN (HGB A1C): Hemoglobin A1C: 6.2 % — AB (ref 4.0–5.6)

## 2023-03-29 MED ORDER — GLIPIZIDE 5 MG PO TABS: ORAL_TABLET | ORAL | 1 refills | Status: DC

## 2023-03-29 MED ORDER — METFORMIN HCL ER 500 MG PO TB24: ORAL_TABLET | ORAL | 3 refills | Status: DC

## 2023-03-29 NOTE — Progress Notes (Signed)
Patient ID: Robinn Overholt, female   DOB: 1960-11-17, 62 y.o.   MRN: [id number]   HPI: Neera Teng is a 62 y.o.-year-old female,  returning for follow-up for DM2, dx in 2004, non-insulin-dependent, uncontrolled, without long-term complications.  Last visit 4 months ago.  Interim history: Before last visit she was exhausted that she was taking care of her husband who fell and tore some of his muscles. No increased urination, blurry vision, nausea, chest pain. She was drinking 2 cans of diet soda in the am >> stopped sodas >> now only water. She had Covid 11/2022. She was on Prednisone in 01/2023, as the sxs persisted.  Reviewed HbA1c levels: Lab Results  Component Value Date   HGBA1C 6.4 (A) 09/14/2022   HGBA1C 6.5 (A) 05/11/2022   HGBA1C 6.6 (A) 09/15/2021  02/15/2018: HbA1c 7.0% 05/12/2017: HbA1c 9.1% 02/01/2017: HbA1c 7.0%  She is on: - Metformin ER 1000 >> 500 mg 2x a day (decreased 2/2 stomach pbs) >> 500 mg with b'fast - Glipizide XL 5 mg before b'fast  - Glipizide 5 >> 10 mg before b'fast (+/- 5 mg before a larger lunch) >> 10 mg before breakfast and 5 mg before a larger lunch - Farxiga 5 mg in am-added 05/2020 >> 10 mg daily - Trulicity 1.5 >> 3 mg weekly  She tried Byetta, Bydureon (1 year) - but had diarrhea. She had very low CBGs with regular Glipizide.  Also, she had hot flashes from the glipizide especially after taking a hot shower.  A freestyle libre 2 PA was recently denied by insurance on 09/17/2021. She is paying for it out of pocket.  The interpretation of her CGM is not covered by insurance. She checks her sugars more than 4 times a day with her CGM:     Prev.:     Previously:   Lowest sugar was 45 (lowest ever) ...  >> 60s >> 60 >> upper 60s >> 40s; she has hypoglycemia awareness in the 60s. Highest sugar was 350 (eating out) >> upper 200s >> 220 >> 280 >> 200s.  Glucometer: One Touch Ultra mini  Pt's meals are: - Breakfast:  b'fast bar (<15g carbs), almonds, cereal >> yoghurt + granola, nuts or skips - Lunch:  veggies, popcorn - Dinner: meat + veggies + rarely starch Diet sodas usually lower her sugars.  No CKD: Lab Results  Component Value Date   BUN 17 09/16/2022   BUN 13 09/22/2021   CREATININE 0.69 09/16/2022   CREATININE 0.82 09/22/2021  08/28/2019: CMP with slightly elevated glucose at 115, BUN/creatinine 10/0.61, GFR 101, ACR <7 02/01/2017: Glucose 185, BUN/creatinine 14/0.73, GFR 93. On losartan.  She has hyperlipidemia: Lab Results  Component Value Date   CHOL 171 09/16/2022   HDL 64 09/16/2022   LDLCALC 90 09/16/2022   TRIG 93 09/16/2022   CHOLHDL 2.7 09/16/2022  Previously:  08/28/2019: 240/159/57/154 02/01/2017: 253/166/59/161  On co-Q10 and fish oil. Nexletol PA was started by the lipid clinic >> denied.  Also, a PCSK9 inhibitor was denied by her insurance. She started to go to the lipid clinic, but not recently.  - last eye exam was 09/08/2022: No DR. Dr. Hazle Quant.  - +  numbness and tingling in her feet.  She is on B complex and alpha lipoic acid.  Last foot exam 09/2022.  On ASA 81.  Pt has FH of DM in father, PGM, 2 brothers, nephews  She works at [employer].  ROS: + See HPI  I reviewed pt's medications,  allergies, PMH, social hx, family hx, and changes were documented in the history of present illness. Otherwise, unchanged from my initial visit note.  Past Medical History:  Diagnosis Date   Allergy    Anxiety    Asthma    Deviated septum 08/16/2018   Diabetes mellitus without complication (HCC)    Family history of early CAD 04/11/2017   Hyperlipidemia 04/11/2017   Hypertension    PSx H: - multiple eye Sx's: 4098-1191 - C-section 1996 - Gall Bladder Sx 1984  Social History   Social History   Marital status: Married    Spouse name: N/A   Number of children: 1   Occupational History   Glass blower/designer   Social History Main Topics   Smoking status: Never  Smoker   Smokeless tobacco: Never Used   Alcohol use No   Drug use: No   Current Outpatient Medications on File Prior to Visit  Medication Sig Dispense Refill   albuterol (VENTOLIN HFA) 108 (90 Base) MCG/ACT inhaler INHALE 2 PUFFS INTO THE LUNGS EVERY 6 HOURS AS NEEDED FOR WHEEZING OR SHORTNESS OF BREATH 6.7 g 1   Alpha-Lipoic Acid 100 MG CAPS Take 6 capsules by mouth 2 (two) times daily.     azelastine (ASTELIN) 0.1 % nasal spray Place 2 sprays into both nostrils 2 (two) times daily as needed for rhinitis. Use in each nostril as directed 90 mL 1   b complex vitamins capsule Take 1 capsule by mouth daily.     Berberine Chloride (BERBERINE HCI PO) Take by mouth.     cetirizine (ZYRTEC) 10 MG tablet Take 1 tablet (10 mg total) by mouth daily. 90 tablet 3   co-enzyme Q-10 50 MG capsule Take 100 mg by mouth daily.     Continuous Blood Gluc Sensor (FREESTYLE LIBRE 3 SENSOR) MISC APPLY EVERY 14 (FOURTEEN) DAYS. 6 each 3   Cranberry 125 MG TABS Take 1 tablet by mouth daily.     cyclobenzaprine (FLEXERIL) 5 MG tablet Take 1 tablet (5 mg total) by mouth 3 (three) times daily as needed for muscle spasms. 30 tablet 1   Dulaglutide (TRULICITY) 3 MG/0.5ML SOPN INJECT 3 MG(0.5MLS) INTO THE SKIN ONCE WEEKLY 6 mL 2   FARXIGA 10 MG TABS tablet TAKE 1 TABLET(10 MG) BY MOUTH DAILY BEFORE BREAKFAST 90 tablet 3   FARXIGA 10 MG TABS tablet Take 1 tablet (10 mg total) by mouth daily before breakfast. 90 tablet 2   fluconazole (DIFLUCAN) 150 MG tablet Take 1 pill today and the 2nd pill in 3 days. 2 tablet 0   fluticasone (FLONASE) 50 MCG/ACT nasal spray Place 2 sprays into both nostrils in the morning and at bedtime. 48 g 3   glipiZIDE (GLUCOTROL XL) 5 MG 24 hr tablet Take 1 tablet (5 mg total) by mouth daily. 90 tablet 3   glipiZIDE (GLUCOTROL) 5 MG tablet Take up to 4 tabs by mouth a day as advised 360 tablet 1   glucose blood (FREESTYLE TEST STRIPS) test strip Use once a day - for freestyle libre 100 each 3    losartan (COZAAR) 100 MG tablet Take 0.5 tablets (50 mg total) by mouth daily. 90 tablet 3   Magnesium Oxide (MAG-200 PO) Take 1 tablet by mouth daily.     metFORMIN (GLUCOPHAGE-XR) 500 MG 24 hr tablet TAKE 2 TABLETS(1000 MG) BY MOUTH TWICE DAILY 360 tablet 1   montelukast (SINGULAIR) 10 MG tablet Take 1 tablet (10 mg total) by mouth at bedtime. 90  tablet 3   Multiple Vitamin (THERA) TABS Take 1 tablet by mouth daily.     Omega-3 Fatty Acids (FISH OIL) 1000 MG CAPS Take 2 capsules by mouth daily.      predniSONE (DELTASONE) 20 MG tablet Take 2 pills in the morning with breakfast for 3 days, then 1 pill for 3 days 9 tablet 0   PREVIDENT 5000 BOOSTER PLUS 1.1 % PSTE SMARTSIG:To Teeth PRN     RSV vaccine recomb adjuvanted (AREXVY) 120 MCG/0.5ML injection Inject into the muscle. 0.5 mL 0   sertraline (ZOLOFT) 100 MG tablet TAKE 1 TABLET(100 MG) BY MOUTH DAILY 90 tablet 3   traZODone (DESYREL) 100 MG tablet Take 1 tablet (100 mg total) by mouth at bedtime. 90 tablet 3   No current facility-administered medications on file prior to visit.   Allergies  Allergen Reactions   Humalog [Insulin Lispro] Other (See Comments)    Muscle cramps   Statins Other (See Comments)    MUSCLE ACHES   Prednisone    Family History  Problem Relation Age of Onset   Ovarian cancer Mother    Arthritis Mother    Cancer Mother    Depression Mother    Heart attack Father    Kidney failure Father    Heart disease Father    Hyperlipidemia Father    Kidney disease Father    Diabetes Father    Sudden Cardiac Death Brother    Alcohol abuse Brother    Asthma Brother    Diabetes Brother    Early death Brother    Heart disease Brother    Hyperlipidemia Brother    Hypertension Brother    Heart attack Brother    Ovarian cancer Maternal Grandmother    Arthritis Maternal Grandmother    Cancer Maternal Grandmother    COPD Maternal Grandmother    Parkinson's disease Maternal Grandfather    Heart failure Paternal  Grandmother    Diabetes Brother    Heart disease Brother    Hyperlipidemia Brother    Breast cancer Neg Hx    PE: BP 124/74   Pulse 79   Ht 5\' 9"  (1.753 m)   Wt 151 lb 6.4 oz (68.7 kg)   SpO2 97%   BMI 22.36 kg/m   Wt Readings from Last 3 Encounters:  03/29/23 151 lb 6.4 oz (68.7 kg)  12/24/22 153 lb 2 oz (69.5 kg)  09/16/22 147 lb 6.4 oz (66.9 kg)   Constitutional: normal weight, in NAD Eyes: no exophthalmos ENT: no masses palpated in neck, no cervical lymphadenopathy Cardiovascular: RRR, No MRG Respiratory: CTA B Musculoskeletal: no deformities Skin: no rashes Neurological: no tremor with outstretched hands  ASSESSMENT: 1. 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 HL  PLAN:  1. Patient with longstanding, previously uncontrolled type 2 diabetes, improved on the regimen of metformin, SGLT2 inhibitor, sulfonylurea, and weekly GLP-1 receptor agonist.  At last visit, sugars appear to be stable from before with only occasional higher blood sugars after meals especially after lunch.  She had occasional blood sugars above 200s but more pronounced spikes right before last visit due to the holidays.  At that time she noticed that she was given a different glipizide tablet from the pharmacy, made by a different manufacturer and sugars were higher when she was using this.  She will wanted to go back to the previous manufacturer and I advised her to check with the pharmacy.  HbA1c at  last visit was 6.4%, improved -At today's visit, we reviewed her printed CGM graphs.  Her sugars are fairly well-controlled, stable fluctuating within the target range.  The higher blood sugars after breakfast and then improves later in the day.  She has been having lows at night and in the last week she stopped her evening metformin.  The sugars overnight are not as low anymore.  However, she feels that her sugars after breakfast are higher.  We discussed  about taking 2 tablets of metformin with breakfast.  If she cannot tolerate the higher dose of metformin taken at 1 time, I can send a prescription for metformin ER 750 mg to her pharmacy.  Since sugars are occasionally dropping during the day, especially before dinner, we discussed about stopping glipizide extended release in the morning.  If the sugars start increasing after this, we can send a prescription for the glipizide ER 2.5 mg to her pharmacy. - I suggested to:  Patient Instructions  Please increase: - Metformin ER 1000 mg daily  Stop: - Glipizide ER   Continue: - Farxiga 10 mg before b'fast - Trulicity 3 mg weekly  - Glipizide 10 mg before b'fast and 5 mg before a larger lunch   Please return in 6 months.   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 HL -Latest lipid from 09/2022 was reviewed: LDL above our target of less than 70, otherwise fractions at goal: Lab Results  Component Value Date   CHOL 171 09/16/2022   HDL 64 09/16/2022   LDLCALC 90 09/16/2022   TRIG 93 09/16/2022   CHOLHDL 2.7 09/16/2022  -She had muscle aches and bone pain from statins and muscle pains from Zetia even at the low dose of 5 mg daily. -She started to go to the lipid clinic -on co-Q10 and fish oil.  Before last visit, I advised her to reestablish care with them, as she could not go previously due to being busy with her husband. -PAs for several cholesterol medications were denied - an appeal was sent with information about her brother dying at 86 from massive heart attack and his father dying in his 101s from heart disease.   Carlus Pavlov, MD PhD [facility]

## 2023-03-29 NOTE — Patient Instructions (Signed)
Please increase: - Metformin ER 1000 mg daily  Stop: - Glipizide ER   Continue: - Farxiga 10 mg before b'fast - Trulicity 3 mg weekly  - Glipizide 10 mg before b'fast and 5 mg before a larger lunch   Please return in 6 months.

## 2023-04-12 ENCOUNTER — Other Ambulatory Visit: Payer: Self-pay | Admitting: Allergy & Immunology

## 2023-04-18 ENCOUNTER — Encounter: Payer: Self-pay | Admitting: Family Medicine

## 2023-04-18 ENCOUNTER — Ambulatory Visit: Payer: Managed Care, Other (non HMO) | Admitting: Family Medicine

## 2023-04-18 ENCOUNTER — Ambulatory Visit
Admission: RE | Admit: 2023-04-18 | Discharge: 2023-04-18 | Disposition: A | Discharge status: Home / Self Care | Payer: Managed Care, Other (non HMO) | Source: Ambulatory Visit | Attending: Family Medicine | Admitting: Family Medicine

## 2023-04-18 VITALS — BP 110/74 | HR 83 | Temp 98.7°F | Ht 69.0 in | Wt 149.2 lb

## 2023-04-18 DIAGNOSIS — Z1211 Encounter for screening for malignant neoplasm of colon: Secondary | ICD-10-CM

## 2023-04-18 DIAGNOSIS — S93401A Sprain of unspecified ligament of right ankle, initial encounter: Secondary | ICD-10-CM | POA: Diagnosis not present

## 2023-04-18 DIAGNOSIS — J4520 Mild intermittent asthma, uncomplicated: Secondary | ICD-10-CM | POA: Diagnosis not present

## 2023-04-18 DIAGNOSIS — E1142 Type 2 diabetes mellitus with diabetic polyneuropathy: Secondary | ICD-10-CM

## 2023-04-18 DIAGNOSIS — J301 Allergic rhinitis due to pollen: Secondary | ICD-10-CM | POA: Diagnosis not present

## 2023-04-18 DIAGNOSIS — M26623 Arthralgia of bilateral temporomandibular joint: Secondary | ICD-10-CM

## 2023-04-18 MED ORDER — CYCLOBENZAPRINE HCL 5 MG PO TABS
5.0000 mg | ORAL_TABLET | Freq: Three times a day (TID) | ORAL | 2 refills | Status: AC | PRN
Start: 2023-04-18 — End: ?

## 2023-04-18 MED ORDER — LOSARTAN POTASSIUM 50 MG PO TABS: 50.0000 mg | ORAL_TABLET | Freq: Every day | ORAL | 3 refills | Status: DC

## 2023-04-18 NOTE — Patient Instructions (Signed)
Please follow up as scheduled for your next visit with me: 09/20/2023   If you have any questions or concerns, please don't hesitate to send me a message via MyChart or call the office at [phone]. Thank you for visiting with [place] today! It's our pleasure caring for you.   Please go to our [facility] office to get your xrays done. You can walk in M-F between 8:30am- noon or 1pm - 5pm. Tell them you are there for xrays ordered by me. They will send me the results, then I will let you know the results with instructions.   Address: [address].  The Xray department is located in the basement.    Ankle Sprain  An ankle sprain is a stretch or tear in a ligament in the ankle. Ligaments are tissues that connect bones to each other. The two most common types of ankle sprains are: Inversion sprain. This happens when the foot turns inward and the ankle rolls outward. It affects the ligament on the outside of the foot (lateral ligament). Eversion sprain. This happens when the foot turns outward and the ankle rolls inward. It affects the ligament on the inner side of the foot (medial ligament). What are the causes? An ankle sprain is often caused by rolling or twisting the ankle by accident. What increases the risk? You are more likely to get an ankle sprain if you play sports. What are the signs or symptoms? Symptoms of an ankle sprain include: Pain in your ankle. Swelling. Bruising. Bruises may form right after you sprain your ankle or 1-2 days later. Trouble standing or walking. This includes trouble turning or changing directions. How is this diagnosed? An ankle sprain is diagnosed with a physical exam. Your health care provider will press on parts of your foot and ankle and try to move them in certain ways. You may also have X-rays taken. These may be done to see how severe the sprain is and to check for broken bones. How is this treated? An ankle sprain may be treated  with: A brace or splint. This is used to keep the ankle from moving until it heals. An elastic bandage (dressing). This is used to support the ankle. Crutches. Pain medicine. Surgery. This may be needed if the sprain is severe. Physical therapy. This may help to improve the range of motion in the ankle. Follow these instructions at home: If you have a removable brace or a splint: Wear the brace or splint as told by your provider. Remove it only as told by your provider. Check the skin around the brace or splint every day. Tell your provider about any concerns. Loosen the brace or splint if your toes tingle, become numb, or turn cold and blue. Keep the brace or splint clean. If the brace or splint is not waterproof: Do not let it get wet. Cover it with a watertight covering when you take a bath or a shower. If you have an elastic dressing: Take the dressing off to shower or bathe. If the dressing feels too tight, adjust it to make it more comfortable. Loosen the dressing if your foot tingles, becomes numb, or turns cold and blue. Managing pain, stiffness, and swelling If told, put ice on the affected area. If you have a removable brace or splint, remove it as told by your provider. Put ice in a plastic bag. Place a towel between your skin and the bag. Leave the ice on for 20 minutes,  2-3 times a day. Remove the ice if your skin turns bright red. This is very important. If you cannot feel pain, heat, or cold, you have a greater risk of damage to the area. If your skin turns bright red, remove the ice right away to prevent skin damage. The risk of damage is higher if you cannot feel pain, heat, or cold. Move your toes often to reduce stiffness and swelling. For 2-3 days, raise (elevate) your ankle above the level of your heart while you are sitting or lying down. General instructions Take over-the-counter and prescription medicines only as told by your provider. Do not use any products  that contain nicotine or tobacco. These products include cigarettes, chewing tobacco, and vaping devices, such as e-cigarettes. If you need help quitting, ask your provider. Rest your ankle. Do not use your ankle to support your body weight until your provider says that you can. Use crutches as told by your provider. Ask your provider when it is safe to drive if you have a brace or splint on your ankle. Contact a health care provider if: You have bruising or swelling that get worse all of a sudden. Your pain does not get better with medicine. Get help right away if: Your foot or toes become numb or blue. You have severe pain that gets worse. This information is not intended to replace advice given to you by your health care provider. Make sure you discuss any questions you have with your health care provider. Document Revised: 06/23/2022 Document Reviewed: 06/23/2022 Elsevier Patient Education  2024 [employer].

## 2023-04-18 NOTE — Progress Notes (Signed)
Subjective  CC:  Chief Complaint  Patient presents with   Ankle Injury    Pt stated that she is still having some pain in her rt ankle and it is hard to drive. Pt also needs referral placed for colonoscopy    HPI: Charlotte Horn is a 62 y.o. female who presents to the office today to address the problems listed above in the chief complaint. Charlotte Horn twisted her ankle 2 weeks ago, date of injury July 1.  Stepped into a hole inverting her ankle.  Had immediate lateral ankle pain, was able to limp to the car and then into her home with the help of her husband.  She iced, rested, elevated and used an ankle splint for about a week.  She has what sounds like a figure-of-eight ankle splint at home and also had a compression ankle support.  She used Aleve daily for a week.  Symptoms have improved, however she still has significant pain of the lateral ankle especially when driving.  No longer with significant swelling.  No bruising.  No new symptoms or injuries.  She has had several ankle sprains in the past. TMJ: Needs refill of Flexeril. Diabetes: Reviewed recent endocrinology notes.  Medications have been adjusted.  A1c 6.2.  She was having some hypoglycemia.  She is using her monitor and readings are mildly elevated.  Peripheral neuropathy is controlled, no foot sores Colon cancer screening is due.  Needs referral.  Average risk.  Normal 10 years ago Allergies and asthma: Uses Singulair, needs refill. Assessment  1. Moderate ankle sprain, right, initial encounter   2. Bilateral temporomandibular joint pain   3. Screening for colorectal cancer   4. Seasonal allergic rhinitis due to pollen   5. Mild intermittent extrinsic asthma without complication   6. 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  Still with significant ligament tenderness.  Recommend restarting figure-of-eight splint for the next 2 weeks.  Then  range of motion exercises.  Rehab once pain-free.  Due to lateral malleolus tenderness, recommend x-ray. Trial as needed for TMJ pain Continue Singulair and allergy medications Type 2 diabetes: With complications: Control is improved but want to avoid hypoglycemia.  Medication changes per endocrinology.  I will follow along.  Follow up: For complete physical as scheduled 09/20/2023  Orders Placed This Encounter  Procedures   DG Ankle Complete Right   Hemoglobin A1c   Ambulatory referral to Gastroenterology   Meds ordered this encounter  Medications   cyclobenzaprine (FLEXERIL) 5 MG tablet    Sig: Take 1 tablet (5 mg total) by mouth 3 (three) times daily as needed for muscle spasms.    Dispense:  60 tablet    Refill:  2   losartan (COZAAR) 50 MG tablet    Sig: Take 1 tablet (50 mg total) by mouth daily.    Dispense:  90 tablet    Refill:  3      I reviewed the patients updated PMH, FH, and SocHx.    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) 06/10/2017    Priority: High  Combined hyperlipidemia associated with type 2 diabetes mellitus (HCC) 04/11/2017    Priority: High   Family history of early CAD 04/11/2017    Priority: High   Psychophysiological insomnia 09/03/2020    Priority: Medium    Seasonal allergic rhinitis 08/16/2018    Priority: Medium    Extrinsic asthma 01/18/2008    Priority: Medium    Bilateral temporomandibular joint pain 09/15/2021    Priority: Low   Chronic sinusitis 08/16/2018    Priority: Low   Current Meds  Medication Sig   albuterol (VENTOLIN HFA) 108 (90 Base) MCG/ACT inhaler INHALE 2 PUFFS INTO THE LUNGS  EVERY 6 HOURS AS NEEDED FOR WHEEZING OR SHORTNESS OF BREATH   Alpha-Lipoic Acid 100 MG CAPS Take 6 capsules by mouth 2 (two) times daily.   azelastine (ASTELIN) 0.1 % nasal spray Place 2 sprays into both nostrils 2 (two) times daily as needed for rhinitis. Use in each nostril as directed   b complex vitamins capsule Take 1 capsule by mouth daily.   Berberine Chloride (BERBERINE HCI PO) Take by mouth.   co-enzyme Q-10 50 MG capsule Take 100 mg by mouth daily.   Continuous Blood Gluc Sensor (FREESTYLE LIBRE 3 SENSOR) MISC APPLY EVERY 14 (FOURTEEN) DAYS.   Cranberry 125 MG TABS Take 1 tablet by mouth daily.   Dulaglutide (TRULICITY) 3 MG/0.5ML SOPN INJECT 3 MG(0.5MLS) INTO THE SKIN ONCE WEEKLY   FARXIGA 10 MG TABS tablet Take 1 tablet (10 mg total) by mouth daily before breakfast.   fluticasone (FLONASE) 50 MCG/ACT nasal spray Place 2 sprays into both nostrils in the morning and at bedtime.   glipiZIDE (GLUCOTROL) 5 MG tablet Take up to 4 tabs by mouth a day as advised   glucose blood (FREESTYLE TEST STRIPS) test strip Use once a day - for freestyle libre   Magnesium Oxide (MAG-200 PO) Take 1 tablet by mouth daily.   metFORMIN (GLUCOPHAGE-XR) 500 MG 24 hr tablet TAKE 1000 MG BY MOUTH DAILY   montelukast (SINGULAIR) 10 MG tablet TAKE 1 TABLET(10 MG) BY MOUTH AT BEDTIME   Multiple Vitamin (THERA) TABS Take 1 tablet by mouth daily.   Omega-3 Fatty Acids (FISH OIL) 1000 MG CAPS Take 2 capsules by mouth daily.    PREVIDENT 5000 BOOSTER PLUS 1.1 % PSTE SMARTSIG:To Teeth PRN   sertraline (ZOLOFT) 100 MG tablet TAKE 1 TABLET(100 MG) BY MOUTH DAILY   traZODone (DESYREL) 100 MG tablet Take 1 tablet (100 mg total) by mouth at bedtime.   [DISCONTINUED] cyclobenzaprine (FLEXERIL) 5 MG tablet Take 1 tablet (5 mg total) by mouth 3 (three) times daily as needed for muscle spasms.   [DISCONTINUED] losartan (COZAAR) 100 MG tablet Take 0.5 tablets (50 mg total) by mouth daily.   [DISCONTINUED] predniSONE  (DELTASONE) 20 MG tablet Take 2 pills in the morning with breakfast for 3 days, then 1 pill for 3 days   [DISCONTINUED] RSV vaccine recomb adjuvanted (AREXVY) 120 MCG/0.5ML injection Inject into the muscle.    Allergies: Patient is allergic to humalog [insulin lispro], statins, and prednisone. Family History: Patient family history includes Alcohol abuse in her brother; Arthritis in her maternal grandmother and mother; Asthma in her brother; COPD in her maternal grandmother; Cancer in her maternal grandmother and mother; Depression in her mother; Diabetes in her brother, brother, and father; Early death in her brother; Heart attack in her brother and father; Heart disease in her brother, brother, and father; Heart failure in her paternal grandmother; Hyperlipidemia in her brother,  brother, and father; Hypertension in her brother; Kidney disease in her father; Kidney failure in her father; Ovarian cancer in her maternal grandmother and mother; Parkinson's disease in her maternal grandfather; Sudden Cardiac Death in her brother. Social History:  Patient  reports that she has never smoked. She has never been exposed to tobacco smoke. She has never used smokeless tobacco. She reports current alcohol use. She reports that she does not use drugs.  Review of Systems: Constitutional: Negative for fever malaise or anorexia Cardiovascular: negative for chest pain Respiratory: negative for SOB or persistent cough Gastrointestinal: negative for abdominal pain  Objective  Vitals: BP 110/74   Pulse 83   Temp 98.7 F (37.1 C)   Ht 5\' 9"  (1.753 m)   Wt 149 lb 3.2 oz (67.7 kg)   SpO2 96%   BMI 22.03 kg/m  General: no acute distress , A&Ox3 Gait: Mild limp Right ankle: Tender over lateral malleolus, tender over lateral ligaments.  No Achilles tendon tenderness, no metatarsal or fibular head tenderness.  Fairly good range of motion but pain with inversion and plantarflexion  Commons side effects, risks,  benefits, and alternatives for medications and treatment plan prescribed today were discussed, and the patient expressed understanding of the given instructions. Patient is instructed to call or message via MyChart if he/she has any questions or concerns regarding our treatment plan. No barriers to understanding were identified. We discussed Red Flag symptoms and signs in detail. Patient expressed understanding regarding what to do in case of urgent or emergency type symptoms.  Medication list was reconciled, printed and provided to the patient in AVS. Patient instructions and summary information was reviewed with the patient as documented in the AVS. This note was prepared with assistance of Dragon voice recognition software. Occasional wrong-word or sound-a-like substitutions may have occurred due to the inherent limitations of voice recognition software

## 2023-04-20 ENCOUNTER — Encounter: Payer: Self-pay | Admitting: Gastroenterology

## 2023-05-18 ENCOUNTER — Other Ambulatory Visit: Payer: Self-pay | Admitting: Allergy & Immunology

## 2023-06-01 ENCOUNTER — Encounter: Payer: Managed Care, Other (non HMO) | Admitting: Gastroenterology

## 2023-06-10 NOTE — Addendum Note (Signed)
Addended by: Pollie Meyer on: 06/10/2023 11:29 AM   Modules accepted: Orders

## 2023-06-16 ENCOUNTER — Other Ambulatory Visit: Payer: Self-pay

## 2023-06-16 MED ORDER — GLIPIZIDE 5 MG PO TABS
ORAL_TABLET | ORAL | 1 refills | Status: DC
  Filled 2023-06-20: qty 270, 90d supply, fill #0

## 2023-06-20 ENCOUNTER — Other Ambulatory Visit (HOSPITAL_BASED_OUTPATIENT_CLINIC_OR_DEPARTMENT_OTHER): Payer: Self-pay

## 2023-06-20 ENCOUNTER — Other Ambulatory Visit: Payer: Self-pay

## 2023-06-20 ENCOUNTER — Ambulatory Visit: Payer: Managed Care, Other (non HMO)

## 2023-06-20 VITALS — Ht 69.0 in | Wt 150.0 lb

## 2023-06-20 DIAGNOSIS — Z1211 Encounter for screening for malignant neoplasm of colon: Secondary | ICD-10-CM

## 2023-06-20 MED ORDER — GLIPIZIDE 5 MG PO TABS
ORAL_TABLET | ORAL | 2 refills | Status: DC
Start: 2023-06-20 — End: 2024-05-24
  Filled 2023-06-20: qty 360, 90d supply, fill #0
  Filled 2023-09-13: qty 360, 90d supply, fill #1
  Filled 2023-12-12: qty 120, 30d supply, fill #2
  Filled 2024-01-18: qty 120, 30d supply, fill #3
  Filled 2024-02-18: qty 120, 30d supply, fill #4

## 2023-06-20 MED ORDER — NA SULFATE-K SULFATE-MG SULF 17.5-3.13-1.6 GM/177ML PO SOLN: 1.0000 | Freq: Once | ORAL | 0 refills | Status: AC

## 2023-06-20 NOTE — Progress Notes (Signed)
Pre visit completed via phone call; Patient verified name, DOB, and address;  No egg or soy allergy known to patient;  No issues known to pt with past sedation with any surgeries or procedures; Patient denies ever being told they had issues or difficulty with intubation;  No FH of Malignant Hyperthermia; Pt is not on diet pills; Pt is not on home 02;  Pt is not on blood thinners;  Pt denies issues with constipation;  No A fib or A flutter; Have any cardiac testing pending--NO Insurance verified during PV appt--- [employer] Pt can ambulate without assistance;  Pt denies use of chewing tobacco; Discussed diabetic/weight loss medication holds; Discussed NSAID holds; Checked BMI to be less than 50; Pt instructed to use [employer] or GoodRx for a price reduction on prep;  Patient's chart reviewed by Cathlyn Parsons CNRA prior to previsit and patient appropriate for the [facility]; Pre visit completed and red dot placed by patient's name on their procedure day (on provider's schedule);    Instructions sent to patient via MyChart;

## 2023-06-27 ENCOUNTER — Encounter: Payer: Self-pay | Admitting: Gastroenterology

## 2023-07-08 ENCOUNTER — Encounter: Payer: Self-pay | Admitting: Gastroenterology

## 2023-07-08 ENCOUNTER — Ambulatory Visit (AMBULATORY_SURGERY_CENTER): Payer: Managed Care, Other (non HMO) | Admitting: Gastroenterology

## 2023-07-08 VITALS — BP 112/63 | HR 69 | Temp 96.0°F | Resp 10 | Ht 69.0 in | Wt 150.0 lb

## 2023-07-08 DIAGNOSIS — D124 Benign neoplasm of descending colon: Secondary | ICD-10-CM

## 2023-07-08 DIAGNOSIS — Z1211 Encounter for screening for malignant neoplasm of colon: Secondary | ICD-10-CM

## 2023-07-08 DIAGNOSIS — K635 Polyp of colon: Secondary | ICD-10-CM | POA: Diagnosis not present

## 2023-07-08 MED ORDER — SODIUM CHLORIDE 0.9 % IV SOLN: 500.0000 mL | INTRAVENOUS | Status: DC

## 2023-07-08 NOTE — Op Note (Signed)
[facility] Patient Name: Charlotte Horn Procedure Date: 07/08/2023 9:00 AM MRN: [id number] Endoscopist: Lorin Picket E. Tomasa Rand , MD, [id number] Age: 62 Referring MD:  Date of Birth: 1961-07-26 Gender: Female Account #: [id number] Procedure:                Colonoscopy Indications:              Screening for colorectal malignant neoplasm (last                            colonoscopy was 10 years ago) Medicines:                Monitored Anesthesia Care Procedure:                Pre-Anesthesia Assessment:                           - Prior to the procedure, a History and Physical                            was performed, and patient medications and                            allergies were reviewed. The patient's tolerance of                            previous anesthesia was also reviewed. The risks                            and benefits of the procedure and the sedation                            options and risks were discussed with the patient.                            All questions were answered, and informed consent                            was obtained. Prior Anticoagulants: The patient has                            taken no anticoagulant or antiplatelet agents. ASA                            Grade Assessment: II - A patient with mild systemic                            disease. After reviewing the risks and benefits,                            the patient was deemed in satisfactory condition to                            undergo the procedure.  After obtaining informed consent, the colonoscope                            was passed under direct vision. Throughout the                            procedure, the patient's blood pressure, pulse, and                            oxygen saturations were monitored continuously. The                            CF [id number] #[id number] was introduced through the anus                            and advanced to  the the cecum, identified by                            appendiceal orifice and ileocecal valve. The                            colonoscopy was somewhat difficult due to a                            tortuous colon. The patient tolerated the procedure                            well. The quality of the bowel preparation was                            good. The ileocecal valve, appendiceal orifice, and                            rectum were photographed. The bowel preparation                            used was SUPREP via split dose instruction. Scope In: 9:13:41 AM Scope Out: 9:30:35 AM Scope Withdrawal Time: 0 hours 11 minutes 0 seconds  Total Procedure Duration: 0 hours 16 minutes 54 seconds  Findings:                 The perianal exam findings include pedunculated                            warty lesion, approximately 15 mm in size.                           The digital rectal exam was normal. Pertinent                            negatives include normal sphincter tone and no                            palpable rectal lesions.  A 2 mm polyp was found in the descending colon. The                            polyp was sessile. The polyp was removed with a                            jumbo cold forceps. Resection and retrieval were                            complete. Estimated blood loss was minimal.                           The exam was otherwise normal throughout the                            examined colon.                           The retroflexed view of the distal rectum and anal                            verge was normal and showed no anal or rectal                            abnormalities. Complications:            No immediate complications. Estimated Blood Loss:     Estimated blood loss was minimal. Impression:               - Pedunculated warty lesion found on perianal exam.                           - One 2 mm polyp in the descending colon, removed                             with a jumbo cold forceps. Resected and retrieved.                           - The distal rectum and anal verge are normal on                            retroflexion view. Recommendation:           - Patient has a contact number available for                            emergencies. The signs and symptoms of potential                            delayed complications were discussed with the                            patient. Return to normal activities tomorrow.  Written discharge instructions were provided to the                            patient.                           - Resume previous diet.                           - Continue present medications.                           - Await pathology results.                           - Repeat colonoscopy (date not yet determined) for                            surveillance based on pathology results.                           - Consider surgery referral for resection of                            perianal lesion. Jadan Hinojos E. Tomasa Rand, MD 07/08/2023 9:36:46 AM This report has been signed electronically.

## 2023-07-08 NOTE — Progress Notes (Signed)
Called to room to assist during endoscopic procedure.  Patient ID and intended procedure confirmed with present staff. Received instructions for my participation in the procedure from the performing physician.  

## 2023-07-08 NOTE — Progress Notes (Signed)
[facility] History and Physical   Primary Care Physician:  Willow Ora, MD   Reason for Procedure:   Colon cancer screening  Plan:    Screening colonoscopy     HPI: Charlotte Horn is a 62 y.o. female undergoing average risk screening colonoscopy.  She has no family history of colon cancer and no chronic GI symptoms.  She reports having a normal colonoscopy in 2014.   Past Medical History:  Diagnosis Date   Anxiety    Arthritis    on meds   Asthma    uses inhaler PRN   Deviated septum 08/16/2018   Diabetes mellitus without complication (HCC)    on meds   Family history of early CAD 04/11/2017   Hyperlipidemia 04/11/2017   on meds   Hypertension    on meds   Seasonal allergies     Past Surgical History:  Procedure Laterality Date   CESAREAN SECTION     CHOLECYSTECTOMY     COLONOSCOPY  2014   normal per pt   EYE SURGERY     multiple surgeries for eye birth defect    Prior to Admission medications   Medication Sig Start Date End Date Taking? Authorizing Provider  Alpha-Lipoic Acid 600 MG CAPS Take 600 capsules by mouth daily.   Yes [provider]  APPLE CIDER VINEGAR PO Take 2 capsules by mouth 2 (two) times daily with a meal.   Yes [provider]  azelastine (ASTELIN) 0.1 % nasal spray Place 2 sprays into both nostrils 2 (two) times daily as needed for rhinitis. Use in each nostril as directed 12/24/21  Yes Verlee Monte, MD  b complex vitamins capsule Take 1 capsule by mouth daily.   Yes [provider]  Berberine Chloride (BERBERINE HCI PO) Take 1 tablet by mouth with breakfast, with lunch, and with evening meal.   Yes [provider]  CINNAMON PO Take 2 capsules by mouth 2 (two) times daily with a meal.   Yes [provider]  CO-ENZYME Q-10 PO Take 200 mg by mouth daily.   Yes [provider]  COLLAGEN PO Take 6 capsules by mouth daily.   Yes [provider]  Continuous  Blood Gluc Sensor (FREESTYLE LIBRE 3 SENSOR) MISC APPLY EVERY 14 (FOURTEEN) DAYS. 10/22/22  Yes Carlus Pavlov, MD  Cranberry 125 MG TABS Take 1 tablet by mouth daily.   Yes [provider]  FARXIGA 10 MG TABS tablet Take 1 tablet (10 mg total) by mouth daily before breakfast. 12/27/22  Yes Carlus Pavlov, MD  fluticasone (FLONASE) 50 MCG/ACT nasal spray SHAKE LIQUID AND USE 2 SPRAYS IN EACH NOSTRIL IN THE MORNING AND AT BEDTIME 05/18/23  Yes Alfonse Spruce, MD  glipiZIDE (GLUCOTROL) 5 MG tablet Take 2 tablets by mouth before breakfast and 2 tablet before larger meal. 06/20/23  Yes Carlus Pavlov, MD  glucose blood (FREESTYLE TEST STRIPS) test strip Use once a day - for freestyle libre 07/11/18  Yes Carlus Pavlov, MD  levocetirizine (XYZAL ALLERGY 24HR) 5 MG tablet Take 5 mg by mouth every evening.   Yes [provider]  losartan (COZAAR) 50 MG tablet Take 1 tablet (50 mg total) by mouth daily. Patient taking differently: Take 25 mg by mouth daily. 1/2 tab daily 04/18/23  Yes Willow Ora, MD  Magnesium Oxide (MAG-200 PO) Take 1 tablet by mouth daily.   Yes [provider]  metFORMIN (GLUCOPHAGE-XR) 500 MG 24 hr tablet TAKE 1000  MG BY MOUTH DAILY Patient taking differently: Take 500 mg by mouth 2 (two) times daily with a meal. 03/29/23  Yes Carlus Pavlov, MD  montelukast (SINGULAIR) 10 MG tablet TAKE 1 TABLET(10 MG) BY MOUTH AT BEDTIME 04/12/23  Yes Alfonse Spruce, MD  Multiple Vitamin (THERA) TABS Take 1 tablet by mouth daily.   Yes [provider]  Omega-3 Fatty Acids (FISH OIL) 1000 MG CAPS Take 1 capsule by mouth daily.   Yes [provider]  PREVIDENT 5000 BOOSTER PLUS 1.1 % PSTE SMARTSIG:To Teeth PRN 05/02/21  Yes [provider]  sertraline (ZOLOFT) 100 MG tablet TAKE 1 TABLET(100 MG) BY MOUTH DAILY 08/30/22  Yes Willow Ora, MD  traZODone (DESYREL) 100 MG tablet Take 1 tablet (100 mg total) by mouth at bedtime.  09/15/21  Yes Willow Ora, MD  VITAMIN D, CHOLECALCIFEROL, PO Take 1 tablet by mouth daily at 6 (six) AM.   Yes [provider]  albuterol (VENTOLIN HFA) 108 (90 Base) MCG/ACT inhaler INHALE 2 PUFFS INTO THE LUNGS EVERY 6 HOURS AS NEEDED FOR WHEEZING OR SHORTNESS OF BREATH 02/01/23   Alfonse Spruce, MD  benzonatate (TESSALON) 200 MG capsule Take 200 mg by mouth 2 (two) times daily as needed for cough. 11/13/22   [provider]  cyclobenzaprine (FLEXERIL) 5 MG tablet Take 1 tablet (5 mg total) by mouth 3 (three) times daily as needed for muscle spasms. 04/18/23   Willow Ora, MD  Dulaglutide (TRULICITY) 3 MG/0.5ML SOPN INJECT 3 MG(0.5MLS) INTO THE SKIN ONCE WEEKLY 12/06/22   Carlus Pavlov, MD    Current Outpatient Medications  Medication Sig Dispense Refill   Alpha-Lipoic Acid 600 MG CAPS Take 600 capsules by mouth daily.     APPLE CIDER VINEGAR PO Take 2 capsules by mouth 2 (two) times daily with a meal.     azelastine (ASTELIN) 0.1 % nasal spray Place 2 sprays into both nostrils 2 (two) times daily as needed for rhinitis. Use in each nostril as directed 90 mL 1   b complex vitamins capsule Take 1 capsule by mouth daily.     Berberine Chloride (BERBERINE HCI PO) Take 1 tablet by mouth with breakfast, with lunch, and with evening meal.     CINNAMON PO Take 2 capsules by mouth 2 (two) times daily with a meal.     CO-ENZYME Q-10 PO Take 200 mg by mouth daily.     COLLAGEN PO Take 6 capsules by mouth daily.     Continuous Blood Gluc Sensor (FREESTYLE LIBRE 3 SENSOR) MISC APPLY EVERY 14 (FOURTEEN) DAYS. 6 each 3   Cranberry 125 MG TABS Take 1 tablet by mouth daily.     FARXIGA 10 MG TABS tablet Take 1 tablet (10 mg total) by mouth daily before breakfast. 90 tablet 2   fluticasone (FLONASE) 50 MCG/ACT nasal spray SHAKE LIQUID AND USE 2 SPRAYS IN EACH NOSTRIL IN THE MORNING AND AT BEDTIME 16 g 1   glipiZIDE (GLUCOTROL) 5 MG tablet Take 2 tablets by mouth before  breakfast and 2 tablet before larger meal. 360 tablet 2   glucose blood (FREESTYLE TEST STRIPS) test strip Use once a day - for freestyle libre 100 each 3   levocetirizine (XYZAL ALLERGY 24HR) 5 MG tablet Take 5 mg by mouth every evening.     losartan (COZAAR) 50 MG tablet Take 1 tablet (50 mg total) by mouth daily. (Patient taking differently: Take 25 mg by mouth daily. 1/2 tab daily)  90 tablet 3   Magnesium Oxide (MAG-200 PO) Take 1 tablet by mouth daily.     metFORMIN (GLUCOPHAGE-XR) 500 MG 24 hr tablet TAKE 1000 MG BY MOUTH DAILY (Patient taking differently: Take 500 mg by mouth 2 (two) times daily with a meal.) 180 tablet 3   montelukast (SINGULAIR) 10 MG tablet TAKE 1 TABLET(10 MG) BY MOUTH AT BEDTIME 90 tablet 1   Multiple Vitamin (THERA) TABS Take 1 tablet by mouth daily.     Omega-3 Fatty Acids (FISH OIL) 1000 MG CAPS Take 1 capsule by mouth daily.     PREVIDENT 5000 BOOSTER PLUS 1.1 % PSTE SMARTSIG:To Teeth PRN     sertraline (ZOLOFT) 100 MG tablet TAKE 1 TABLET(100 MG) BY MOUTH DAILY 90 tablet 3   traZODone (DESYREL) 100 MG tablet Take 1 tablet (100 mg total) by mouth at bedtime. 90 tablet 3   VITAMIN D, CHOLECALCIFEROL, PO Take 1 tablet by mouth daily at 6 (six) AM.     albuterol (VENTOLIN HFA) 108 (90 Base) MCG/ACT inhaler INHALE 2 PUFFS INTO THE LUNGS EVERY 6 HOURS AS NEEDED FOR WHEEZING OR SHORTNESS OF BREATH 6.7 g 1   benzonatate (TESSALON) 200 MG capsule Take 200 mg by mouth 2 (two) times daily as needed for cough.     cyclobenzaprine (FLEXERIL) 5 MG tablet Take 1 tablet (5 mg total) by mouth 3 (three) times daily as needed for muscle spasms. 60 tablet 2   Dulaglutide (TRULICITY) 3 MG/0.5ML SOPN INJECT 3 MG(0.5MLS) INTO THE SKIN ONCE WEEKLY 6 mL 2   Current Facility-Administered Medications  Medication Dose Route Frequency Provider Last Rate Last Admin   0.9 %  sodium chloride infusion  500 mL Intravenous Continuous Jenel Lucks, MD        Allergies as of 07/08/2023 -  Review Complete 07/08/2023  Allergen Reaction Noted   Humalog [insulin lispro] Other (See Comments) 12/07/2021   Prednisone Other (See Comments) 01/18/2008   Statins Other (See Comments) 12/24/2015    Family History  Problem Relation Age of Onset   Ovarian cancer Mother    Arthritis Mother    Cancer Mother    Depression Mother    Heart attack Father    Kidney failure Father    Heart disease Father    Hyperlipidemia Father    Kidney disease Father    Diabetes Father    Sudden Cardiac Death Brother    Alcohol abuse Brother    Asthma Brother    Diabetes Brother    Early death Brother    Heart disease Brother    Hyperlipidemia Brother    Hypertension Brother    Heart attack Brother    Diabetes Brother    Heart disease Brother    Hyperlipidemia Brother    Ovarian cancer Maternal Grandmother    Arthritis Maternal Grandmother    Cancer Maternal Grandmother    COPD Maternal Grandmother    Parkinson's disease Maternal Grandfather    Heart failure Paternal Grandmother    Breast cancer Neg Hx    Colon polyps Neg Hx    Colon cancer Neg Hx    Esophageal cancer Neg Hx    Stomach cancer Neg Hx    Rectal cancer Neg Hx     Social History   Socioeconomic History   Marital status: Married    Spouse name: Not on file   Number of children: Not on file   Years of education: Not on file   Highest education level: Not on  file  Occupational History    Employer: [employer]  Tobacco Use   Smoking status: Never    Passive exposure: Never   Smokeless tobacco: Never  Vaping Use   Vaping status: Never Used  Substance and Sexual Activity   Alcohol use: Yes    Comment: special occasions   Drug use: Never   Sexual activity: Yes    Birth control/protection: Post-menopausal  Other Topics Concern   Not on file  Social History Narrative   Not on file   Social Determinants of Health   Financial Resource Strain: Low Risk  (10/14/2021)   Received from [facility], [facility]    Overall Financial Resource Strain (CARDIA)    Difficulty of Paying Living Expenses: Not very hard  Food Insecurity: No Food Insecurity (10/14/2021)   Received from [facility], [facility]   Hunger Vital Sign    Worried About Running Out of Food in the Last Year: Never true    Ran Out of Food in the Last Year: Never true  Transportation Needs: No Transportation Needs (10/14/2021)   Received from [facility], [facility]   PRAPARE - Transportation    Lack of Transportation (Medical): No    Lack of Transportation (Non-Medical): No  Physical Activity: Inactive (10/14/2021)   Received from [facility], [facility]   Exercise Vital Sign    Days of Exercise per Week: 0 days    Minutes of Exercise per Session: 0 min  Stress: Stress Concern Present (10/14/2021)   Received from [facility], [facility] - Occupational Stress Questionnaire    Feeling of Stress : To some extent  Social Connections: Unknown (02/15/2022)   Received from [facility], [facility]   Social Network    Social Network: Not on file  Intimate Partner Violence: Unknown (01/07/2022)   Received from [facility], [facility]   HITS    Physically Hurt: Not on file    Insult or Talk Down To: Not on file    Threaten Physical Harm: Not on file    Scream or Curse: Not on file    Review of Systems:  All other review of systems negative except as mentioned in the HPI.  Physical Exam: Vital signs BP 128/70   Pulse 74   Temp (!) 96 F (35.6 C)   Ht 5\' 9"  (1.753 m)   Wt 150 lb (68 kg)   SpO2 100%   BMI 22.15 kg/m   General:   Alert,  Well-developed, well-nourished, pleasant and cooperative in NAD Airway:  Mallampati 2 Lungs:  Clear throughout to auscultation.   Heart:  Regular rate and rhythm; no murmurs, clicks, rubs,  or gallops. Abdomen:  Soft, nontender and nondistended. Normal bowel sounds.   Neuro/Psych:  Normal mood and affect. A and O x  3   Valeriano Bain E. Tomasa Rand, MD [facility]

## 2023-07-08 NOTE — Progress Notes (Signed)
Pt's states no medical or surgical changes since previsit or office visit. 

## 2023-07-08 NOTE — Progress Notes (Signed)
Sedate, gd SR, tolerated procedure well, VSS, report to RN 

## 2023-07-08 NOTE — Patient Instructions (Signed)

## 2023-07-11 ENCOUNTER — Telehealth: Payer: Self-pay | Admitting: *Deleted

## 2023-07-11 NOTE — Telephone Encounter (Signed)
  Follow up Call-     07/08/2023    8:19 AM  Call back number  Post procedure Call Back phone  # [phone]  Permission to leave phone message Yes     Patient questions:  Do you have a fever, pain , or abdominal swelling? No. Pain Score  0 *  Have you tolerated food without any problems? Yes.    Have you been able to return to your normal activities? Yes.    Do you have any questions about your discharge instructions: Diet   No. Medications  No. Follow up visit  No.  Do you have questions or concerns about your Care? No.  Actions: * If pain score is 4 or above: No action needed, pain <4.

## 2023-07-12 LAB — SURGICAL PATHOLOGY

## 2023-07-14 NOTE — Progress Notes (Signed)
Ms. Caran, [place] news: the polyp that I removed during your recent examination was NOT precancerous.  You should continue to follow current colorectal cancer screening guidelines with a repeat colonoscopy in 10 years.    If you develop any new rectal bleeding, abdominal pain or significant bowel habit changes, please contact me before then.

## 2023-07-26 ENCOUNTER — Other Ambulatory Visit (HOSPITAL_BASED_OUTPATIENT_CLINIC_OR_DEPARTMENT_OTHER): Payer: Self-pay

## 2023-07-26 MED ORDER — FLULAVAL 0.5 ML IM SUSY
0.5000 mL | PREFILLED_SYRINGE | Freq: Once | INTRAMUSCULAR | 0 refills | Status: AC
  Filled 2023-07-26: qty 0.5, 1d supply, fill #0

## 2023-07-26 MED ORDER — COMIRNATY 30 MCG/0.3ML IM SUSY
0.3000 mL | PREFILLED_SYRINGE | Freq: Once | INTRAMUSCULAR | 0 refills | Status: AC
  Filled 2023-07-26: qty 0.3, 1d supply, fill #0

## 2023-08-04 ENCOUNTER — Other Ambulatory Visit: Payer: Self-pay | Admitting: Internal Medicine

## 2023-08-04 DIAGNOSIS — E1142 Type 2 diabetes mellitus with diabetic polyneuropathy: Secondary | ICD-10-CM

## 2023-09-12 ENCOUNTER — Telehealth: Payer: Self-pay | Admitting: Family Medicine

## 2023-09-12 ENCOUNTER — Other Ambulatory Visit: Payer: Self-pay

## 2023-09-12 DIAGNOSIS — Z1231 Encounter for screening mammogram for malignant neoplasm of breast: Secondary | ICD-10-CM

## 2023-09-12 NOTE — Telephone Encounter (Signed)
Patient requests an Order be placed for yearly Mammogram at [facility]. States [facility] no longer takes Patient's insurance.

## 2023-09-14 ENCOUNTER — Other Ambulatory Visit (HOSPITAL_BASED_OUTPATIENT_CLINIC_OR_DEPARTMENT_OTHER): Payer: Self-pay

## 2023-09-20 ENCOUNTER — Other Ambulatory Visit: Payer: Self-pay | Admitting: Internal Medicine

## 2023-09-20 ENCOUNTER — Other Ambulatory Visit (HOSPITAL_COMMUNITY)
Admission: RE | Admit: 2023-09-20 | Discharge: 2023-09-20 | Disposition: A | Discharge status: Home / Self Care | Payer: Managed Care, Other (non HMO) | Source: Ambulatory Visit | Attending: Family Medicine | Admitting: Family Medicine

## 2023-09-20 ENCOUNTER — Encounter: Payer: Self-pay | Admitting: Family Medicine

## 2023-09-20 ENCOUNTER — Ambulatory Visit (INDEPENDENT_AMBULATORY_CARE_PROVIDER_SITE_OTHER): Payer: Managed Care, Other (non HMO) | Admitting: Family Medicine

## 2023-09-20 VITALS — BP 106/76 | HR 84 | Temp 97.6°F | Ht 69.0 in | Wt 147.0 lb

## 2023-09-20 DIAGNOSIS — Z124 Encounter for screening for malignant neoplasm of cervix: Secondary | ICD-10-CM | POA: Insufficient documentation

## 2023-09-20 DIAGNOSIS — J0100 Acute maxillary sinusitis, unspecified: Secondary | ICD-10-CM | POA: Diagnosis not present

## 2023-09-20 DIAGNOSIS — E1159 Type 2 diabetes mellitus with other circulatory complications: Secondary | ICD-10-CM

## 2023-09-20 DIAGNOSIS — Z8049 Family history of malignant neoplasm of other genital organs: Secondary | ICD-10-CM

## 2023-09-20 DIAGNOSIS — Z7985 Long-term (current) use of injectable non-insulin antidiabetic drugs: Secondary | ICD-10-CM

## 2023-09-20 DIAGNOSIS — E1169 Type 2 diabetes mellitus with other specified complication: Secondary | ICD-10-CM

## 2023-09-20 DIAGNOSIS — E1142 Type 2 diabetes mellitus with diabetic polyneuropathy: Secondary | ICD-10-CM

## 2023-09-20 DIAGNOSIS — I152 Hypertension secondary to endocrine disorders: Secondary | ICD-10-CM

## 2023-09-20 DIAGNOSIS — F411 Generalized anxiety disorder: Secondary | ICD-10-CM | POA: Diagnosis not present

## 2023-09-20 DIAGNOSIS — J4520 Mild intermittent asthma, uncomplicated: Secondary | ICD-10-CM

## 2023-09-20 DIAGNOSIS — Z0001 Encounter for general adult medical examination with abnormal findings: Secondary | ICD-10-CM

## 2023-09-20 DIAGNOSIS — E782 Mixed hyperlipidemia: Secondary | ICD-10-CM

## 2023-09-20 MED ORDER — SERTRALINE HCL 100 MG PO TABS: 100.0000 mg | ORAL_TABLET | Freq: Every day | ORAL | 3 refills | Status: DC

## 2023-09-20 MED ORDER — BENZONATATE 200 MG PO CAPS: 200.0000 mg | ORAL_CAPSULE | Freq: Two times a day (BID) | ORAL | 0 refills | Status: DC | PRN

## 2023-09-20 MED ORDER — DOXYCYCLINE HYCLATE 100 MG PO TABS: 100.0000 mg | ORAL_TABLET | Freq: Two times a day (BID) | ORAL | 0 refills | Status: DC

## 2023-09-20 MED ORDER — AZELASTINE HCL 0.1 % NA SOLN: 2.0000 | Freq: Two times a day (BID) | NASAL | 1 refills | Status: DC | PRN

## 2023-09-20 MED ORDER — LOSARTAN POTASSIUM 50 MG PO TABS: 25.0000 mg | ORAL_TABLET | Freq: Every day | ORAL | Status: DC

## 2023-09-20 NOTE — Patient Instructions (Signed)
Please return in 12 months for your annual complete physical; please come fasting.   I will release your lab results to you on your MyChart account with further instructions. You may see the results before I do, but when I review them I will send you a message with my report or have my assistant call you if things need to be discussed. Please reply to my message with any questions. Thank you!   If you have any questions or concerns, please don't hesitate to send me a message via MyChart or call the office at [phone]. Thank you for visiting with us today! It's our pleasure caring for you.  

## 2023-09-20 NOTE — Progress Notes (Signed)
Subjective  Chief Complaint  Patient presents with   Annual Exam    Pt here for Annual exam and is currently fasting. Eye exam is scheduled for 09/22/2023 and mammo is scheduled for 09/23/2023   Diabetes    HPI: Charlotte Horn is a 62 y.o. female who presents to [facility] [place] today for a Female Wellness Visit. She also has the concerns and/or needs as listed above in the chief complaint. These will be addressed in addition to the Health Maintenance Visit.   Wellness Visit: annual visit with health maintenance review and exam  Health maintenance: Had GYN exam with Pap smear 12/23, however strong family history of cervical cancer requesting annual Pap smears.  No vaginal bleeding or pelvic pain.  Mammogram scheduled for next week.  Eye exam scheduled for tomorrow.  Immunizations are current. Chronic disease f/u and/or acute problem visit: (deemed necessary to be done in addition to the wellness visit): Chronic allergies with chronic symptoms on montelukast and Flonase and Xyzal.  However over the last week developed thick green sinus drainage, sinus pain, hacking cough, occasional wheeze, and malaise.  Typical of her sinus infections.  No fevers.  No shortness of breath.  Does have asthma. Diabetic care per endocrinology.  Patient reports stable.  Diabetic neuropathy symptoms persist with constant tingling.  No pain.  No foot sores.  Immunizations current.  She remains on an ACE inhibitor. Hyperlipidemia intolerant to statins and Zetia Anxiety well-controlled on sertraline.  Needs refill.  Sleep is controlled with trazodone. Blood pressure is controlled on 25 mg of losartan daily.  Assessment  1. Encounter for well adult exam with abnormal findings   2. Cervical cancer screening   3. Hypertension associated with diabetes (HCC)   4. 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 GAD (generalized anxiety disorder)   6. Combined  hyperlipidemia associated with type 2 diabetes mellitus (HCC)   7. Diabetic peripheral neuropathy associated with type 2 diabetes mellitus (HCC)   8. Family history of cervical cancer   9. Subacute maxillary sinusitis   10. Mild intermittent extrinsic asthma without complication      Plan  Female Wellness Visit: Age appropriate Health Maintenance and Prevention measures were discussed with patient. Included topics are cancer screening recommendations, ways to keep healthy (see AVS) including dietary and exercise recommendations, regular eye and dental care, use of seat belts, and avoidance of moderate alcohol use and tobacco use.  Pap smear today.  Other screens current.  Mammogram this week BMI: discussed patient's BMI and encouraged positive lifestyle modifications to help get to or maintain a target BMI. HM needs and immunizations were addressed and ordered. See below for orders. See HM and immunization section for updates. Routine labs and screening tests ordered including cmp, cbc and lipids where appropriate. Discussed recommendations regarding Vit D and calcium supplementation (see AVS)  Chronic disease management visit and/or acute problem visit: Diabetes per endocrinology. Anxiety disorder controlled with sertraline 100 mg refilled today. Hypertension on losartan 25, recheck renal function and electrolytes Hyperlipidemia but intolerant to medications.  Fasting for recheck. Treat sinusitis with doxycycline 100 mg twice daily for 10 days.  Continue albuterol for as needed asthma.  Follow-up if not improving.  Continue allergy medications.  Add Astelin nasal spray Family history of cervical cancer for annual Pap today.  Follow up: 12 months for complete physical Orders Placed This Encounter  Procedures   CBC with Differential/Platelet  Comprehensive metabolic panel   Lipid panel   Hemoglobin A1c   TSH   Microalbumin / creatinine urine ratio   Meds ordered this encounter   Medications   benzonatate (TESSALON) 200 MG capsule    Sig: Take 1 capsule (200 mg total) by mouth 2 (two) times daily as needed for cough.    Dispense:  20 capsule    Refill:  0   sertraline (ZOLOFT) 100 MG tablet    Sig: Take 1 tablet (100 mg total) by mouth daily.    Dispense:  90 tablet    Refill:  3   losartan (COZAAR) 50 MG tablet    Sig: Take 0.5 tablets (25 mg total) by mouth daily. 1/2 tab daily   doxycycline (VIBRA-TABS) 100 MG tablet    Sig: Take 1 tablet (100 mg total) by mouth 2 (two) times daily.    Dispense:  20 tablet    Refill:  0   azelastine (ASTELIN) 0.1 % nasal spray    Sig: Place 2 sprays into both nostrils 2 (two) times daily as needed for rhinitis. Use in each nostril as directed    Dispense:  90 mL    Refill:  1      Body mass index is 21.71 kg/m. 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) 06/10/2017     Priority: High   Combined hyperlipidemia associated with type 2 diabetes mellitus (HCC) 04/11/2017     Priority: High   Family history of early CAD 04/11/2017     Priority: High   Psychophysiological insomnia 09/03/2020     Priority: Medium    Seasonal allergic rhinitis 08/16/2018     Priority: Medium    Extrinsic asthma 01/18/2008     Priority: Medium    Bilateral temporomandibular joint pain 09/15/2021     Priority: Low   Chronic sinusitis 08/16/2018     Priority: Low   Health Maintenance  Topic Date Due   Cervical Cancer Screening (HPV/Pap Cotest)  Never done    Diabetic kidney evaluation - eGFR measurement  09/17/2023   Diabetic kidney evaluation - Urine ACR  09/17/2023   MAMMOGRAM  09/18/2023   OPHTHALMOLOGY EXAM  09/09/2023   HEMOGLOBIN A1C  09/28/2023   FOOT EXAM  09/19/2024   DTaP/Tdap/Td (3 - Td or Tdap) 09/04/2027   Colonoscopy  07/07/2033   INFLUENZA VACCINE  Completed   COVID-19 Vaccine  Completed   Hepatitis C Screening  Completed   Zoster Vaccines- Shingrix  Completed   HPV VACCINES  Aged Out   HIV Screening  Discontinued   Immunization History  Administered Date(s) Administered   Influenza, Seasonal, Injecte, Preservative Fre 07/26/2023   Influenza,inj,Quad PF,6+ Mos 07/25/2017, 07/11/2018, 06/28/2019, 07/04/2022   Influenza-Unspecified 07/03/2020, 07/17/2021   PFIZER Comirnaty(Gray Top)Covid-19 Tri-Sucrose Vaccine 02/04/2021   PFIZER(Purple Top)SARS-COV-2 Vaccination 12/06/2019, 12/27/2019, 07/03/2020   Pfizer(Comirnaty)Fall Seasonal Vaccine 12 years and older 08/19/2022, 07/26/2023   Pneumococcal Conjugate-13 08/15/2016, 08/01/2018   Pneumococcal Polysaccharide-23 10/04/2006, 10/06/2015, 09/03/2020  Respiratory Syncytial Virus Vaccine,Recomb Aduvanted(Arexvy) 09/16/2022   Tdap 04/22/2015, 09/03/2017   Unspecified SARS-COV-2 Vaccination 12/07/2019, 12/22/2019, 06/04/2020   Zoster Recombinant(Shingrix) 07/01/2019, 03/04/2021   Zoster, Live 06/04/2009   We updated and reviewed the patient's past history in detail and it is documented below. Allergies: Patient is allergic to humalog [insulin lispro], prednisone, and statins. Past Medical History Patient  has a past medical history of Anxiety, Arthritis, Asthma, Deviated septum (08/16/2018), Diabetes mellitus without complication (HCC), Family history of early CAD (04/11/2017), Hyperlipidemia (04/11/2017), Hypertension, and Seasonal allergies. Past Surgical History Patient  has a past surgical history that includes Cholecystectomy; Cesarean section; Colonoscopy (2014); and Eye  surgery. Family History: Patient family history includes Alcohol abuse in her brother; Arthritis in her maternal grandmother and mother; Asthma in her brother; COPD in her maternal grandmother; Cancer in her maternal grandmother and mother; Depression in her mother; Diabetes in her brother, brother, and father; Early death in her brother; Heart attack in her brother and father; Heart disease in her brother, brother, and father; Heart failure in her paternal grandmother; Hyperlipidemia in her brother, brother, and father; Hypertension in her brother; Kidney disease in her father; Kidney failure in her father; Ovarian cancer in her maternal grandmother and mother; Parkinson's disease in her maternal grandfather; Sudden Cardiac Death in her brother. Social History:  Patient  reports that she has never smoked. She has never been exposed to tobacco smoke. She has never used smokeless tobacco. She reports current alcohol use. She reports that she does not use drugs.  Review of Systems: Constitutional: negative for fever or malaise Ophthalmic: negative for photophobia, double vision or loss of vision Cardiovascular: negative for chest pain, dyspnea on exertion, or new LE swelling Respiratory: negative for SOB or persistent cough Gastrointestinal: negative for abdominal pain, change in bowel habits or melena Genitourinary: negative for dysuria or gross hematuria, no abnormal uterine bleeding or disharge Musculoskeletal: negative for new gait disturbance or muscular weakness Integumentary: negative for new or persistent rashes, no breast lumps Neurological: negative for TIA or stroke symptoms Psychiatric: negative for SI or delusions Allergic/Immunologic: negative for hives  Patient Care Team    Relationship Specialty Notifications Start End  Willow Ora, MD PCP - General Family Medicine  09/03/20   Chilton Si, MD PCP - Cardiology Cardiology  10/17/20   Silverio Lay, MD Consulting Physician  Obstetrics and Gynecology  09/03/20   Carlus Pavlov, MD Consulting Physician Endocrinology  09/03/20   Chrystie Nose, MD Consulting Physician Cardiology  09/03/20    Comment: lipids clinic  Chilton Si, MD Consulting Physician Cardiology  09/03/20     Objective  Vitals: BP 106/76   Pulse 84   Temp 97.6 F (36.4 C)   Ht 5\' 9"  (1.753 m)   Wt 147 lb (66.7 kg)   SpO2 98%   BMI 21.71 kg/m  General:  Well developed, well nourished, no acute distress, hacking cough present, thin Psych:  Alert and orientedx3,normal mood and affect HEENT:  Normocephalic, atraumatic, non-icteric sclera,  supple neck without adenopathy, mass or thyromegaly, nasal congestion present Cardiovascular:  Normal S1, S2, RRR without gallop, rub or murmur Respiratory:  Good breath sounds bilaterally, CTAB with normal respiratory effort Gastrointestinal: normal bowel sounds, soft, non-tender, no noted masses. No HSM MSK: extremities without edema, joints without erythema or swelling Neurologic:    Mental status is normal.  Gross motor and sensory exams are normal.  No tremor Pelvic Exam: Normal external genitalia, no vulvar or vaginal lesions present.  Clear cervix w/o CMT. Bimanual exam reveals a nontender fundus w/o masses, nl size. No adnexal masses present. No inguinal adenopathy. A PAP smear was performed.    Commons side effects, risks, benefits, and alternatives for medications and treatment plan prescribed today were discussed, and the patient expressed understanding of the given instructions. Patient is instructed to call or message via MyChart if he/she has any questions or concerns regarding our treatment plan. No barriers to understanding were identified. We discussed Red Flag symptoms and signs in detail. Patient expressed understanding regarding what to do in case of urgent or emergency type symptoms.  Medication list was reconciled, printed and provided to the patient in AVS. Patient instructions and  summary information was reviewed with the patient as documented in the AVS. This note was prepared with assistance of Dragon voice recognition software. Occasional wrong-word or sound-a-like substitutions may have occurred due to the inherent limitations of voice recognition software

## 2023-09-21 ENCOUNTER — Other Ambulatory Visit: Payer: Self-pay | Admitting: Allergy & Immunology

## 2023-09-21 LAB — CYTOLOGY - PAP
Comment: NEGATIVE
Diagnosis: NEGATIVE
High risk HPV: NEGATIVE

## 2023-09-22 ENCOUNTER — Encounter: Payer: Self-pay | Admitting: Ophthalmology

## 2023-09-22 LAB — HM DIABETES EYE EXAM

## 2023-09-23 ENCOUNTER — Ambulatory Visit (HOSPITAL_BASED_OUTPATIENT_CLINIC_OR_DEPARTMENT_OTHER)
Admission: RE | Admit: 2023-09-23 | Discharge: 2023-09-23 | Disposition: A | Discharge status: Home / Self Care | Payer: Managed Care, Other (non HMO) | Source: Ambulatory Visit | Attending: Family Medicine | Admitting: Family Medicine

## 2023-09-23 ENCOUNTER — Telehealth: Payer: Self-pay

## 2023-09-23 ENCOUNTER — Encounter (HOSPITAL_BASED_OUTPATIENT_CLINIC_OR_DEPARTMENT_OTHER): Payer: Self-pay | Admitting: Radiology

## 2023-09-23 ENCOUNTER — Other Ambulatory Visit: Payer: Self-pay | Admitting: Family

## 2023-09-23 DIAGNOSIS — Z1231 Encounter for screening mammogram for malignant neoplasm of breast: Secondary | ICD-10-CM | POA: Diagnosis present

## 2023-09-23 DIAGNOSIS — B3731 Acute candidiasis of vulva and vagina: Secondary | ICD-10-CM

## 2023-09-23 MED ORDER — FLUCONAZOLE 150 MG PO TABS: ORAL_TABLET | ORAL | 0 refills | Status: DC

## 2023-09-23 NOTE — Progress Notes (Signed)
Charlotte Horn, please ask lab to look into why her blood tests have not resulted yet. thanks  Nl pap and neg HR HPV screen; mychart note sent. Repeat in 5 years. [name], Thank you for allowing me to care for you at your recent office visit.  I wanted to let you know that I have reviewed your Pap smear and HPV test results and am happy to report that they are normal.  There is no sign of cervical pre-cancer or cancer. We can repeat these test in 5 years!  Some of your blood test have not yet resulted yet so I will look into why, but the blood counts and A1c lookd fine.  Sincerely, Dr. Mardelle Matte

## 2023-09-23 NOTE — Addendum Note (Signed)
Addended by: Asencion Partridge on: 09/23/2023 04:26 PM   Modules accepted: Orders

## 2023-09-23 NOTE — Telephone Encounter (Signed)
Copied from CRM [id number]. Topic: Clinical - Medication Question >> Sep 23, 2023  1:17 PM Deaijah H wrote: Reason for CRM: Patient called in wanting to know if prescription could be ready or approved today

## 2023-09-26 ENCOUNTER — Encounter: Payer: Self-pay | Admitting: Internal Medicine

## 2023-09-26 ENCOUNTER — Telehealth: Payer: Managed Care, Other (non HMO) | Admitting: Internal Medicine

## 2023-09-26 DIAGNOSIS — E1142 Type 2 diabetes mellitus with diabetic polyneuropathy: Secondary | ICD-10-CM

## 2023-09-26 DIAGNOSIS — Z7984 Long term (current) use of oral hypoglycemic drugs: Secondary | ICD-10-CM | POA: Diagnosis not present

## 2023-09-26 DIAGNOSIS — E7800 Pure hypercholesterolemia, unspecified: Secondary | ICD-10-CM | POA: Diagnosis not present

## 2023-09-26 DIAGNOSIS — E1165 Type 2 diabetes mellitus with hyperglycemia: Secondary | ICD-10-CM | POA: Diagnosis not present

## 2023-09-26 DIAGNOSIS — Z7985 Long-term (current) use of injectable non-insulin antidiabetic drugs: Secondary | ICD-10-CM | POA: Diagnosis not present

## 2023-09-26 MED ORDER — SEMAGLUTIDE(0.25 OR 0.5MG/DOS) 2 MG/3ML ~~LOC~~ SOPN: 0.5000 mg | PEN_INJECTOR | SUBCUTANEOUS | 3 refills | Status: DC

## 2023-09-26 NOTE — Patient Instructions (Addendum)
Please continue: - Metformin ER 500 mg with breakfast and lunch - Farxiga 10 mg before b'fast - Glipizide 10 mg before b'fast and 5 mg before lunch and dinner  Try to change from Trulicity to Ozempic 0.5 mg weekly.  Please return in 4-6 months.

## 2023-09-26 NOTE — Progress Notes (Signed)
Patient ID: Charlotte Horn, female   DOB: May 10, 1961, 62 y.o.   MRN: [id number]   Patient location: Home My location: Office Persons participating in the virtual visit: patient, provider  Referring Provider: Willow Ora, MD  I connected with the patient on 09/26/23 at  62 PM EST by a video enabled telemedicine application and verified that I am speaking with the correct person.   I discussed the limitations of evaluation and management by telemedicine and the availability of in person appointments. The patient expressed understanding and agreed to proceed.   Details of the encounter are shown below.  HPI: Charlotte Horn is a 62 y.o.-year-old female,  returning for follow-up for DM2, dx in 2004, non-insulin-dependent, uncontrolled, without long-term complications.  Last visit 6 months ago.  Interim history: No increased urination, blurry vision, nausea, chest pain. She had a URI recently and was on Sudafed.  She noticed that this was greatly increasing her blood sugars, up to 280s.  She stopped this approximately a week ago.  She is currently on antibiotics.  Reviewed HbA1c levels: Lab Results  Component Value Date   HGBA1C 6.6 (H) 09/20/2023   HGBA1C 6.2 (A) 03/29/2023   HGBA1C 6.2 03/28/2023  02/15/2018: HbA1c 7.0% 05/12/2017: HbA1c 9.1% 02/01/2017: HbA1c 7.0%  She is on: - Metformin ER 1000 >> 500 mg 2x a day (decreased 2/2 stomach pbs) >> .Marland KitchenMarland Kitchen1000 mg with breakfast >> 500 mg in am and 500 mg in am  >> stopped 03/2023 - Glipizide 5 >> 10 mg before b'fast (+/- 5 mg before a larger lunch) >> 10 mg before breakfast and 5 mg before lunch and 5 mg before dinner - Farxiga 5 mg in am-added 05/2020 >> 10 mg daily - Trulicity 1.5 >> 3 mg weekly  She tried Byetta, Bydureon (1 year) - but had diarrhea. She had very low CBGs with regular Glipizide.  Also, she had hot flashes from the glipizide especially after taking a hot shower.  A freestyle libre 2 PA was denied  by insurance on 09/17/2021. She is paying for it out of pocket.  The interpretation of her CGM is not covered by insurance. She checks her sugars more than 4 times a day with her CGM:   Prev.:     Lowest sugar was upper 60s >> 40s >>; she has hypoglycemia awareness in the 60s. Highest sugar was 350 (eating out) >> .Marland KitchenMarland Kitchen200s >> 282.  Glucometer: One Touch Ultra mini  Pt's meals are: - Breakfast: b'fast bar (<15g carbs), almonds, cereal >> yoghurt + granola, nuts or skips - Lunch:  veggies, popcorn - Dinner: meat + veggies + rarely starch Diet sodas usually lower her sugars.  No CKD: Lab Results  Component Value Date   BUN WILL FOLLOW 09/20/2023   BUN 17 09/16/2022   CREATININE WILL FOLLOW 09/20/2023   CREATININE 0.69 09/16/2022   Lab Results  Component Value Date   MICRALBCREAT WILL FOLLOW 09/20/2023   MICRALBCREAT 9 09/16/2022  On losartan.  She has hyperlipidemia: Lab Results  Component Value Date   CHOL WILL FOLLOW 09/20/2023   HDL WILL FOLLOW 09/20/2023   LDLCALC WILL FOLLOW 09/20/2023   TRIG WILL FOLLOW 09/20/2023   CHOLHDL WILL FOLLOW 09/20/2023  Previously:  08/28/2019: 240/159/57/154 02/01/2017: 253/166/59/161  On co-Q10 and fish oil. Nexletol PA was started by the lipid clinic >> denied.  Also, a PCSK9 inhibitor was denied by her insurance. She started to go to the lipid clinic, but not recently.  - last eye  exam was 09/22/2023: No DR. Dr. Hazle Quant.  - +  numbness and tingling in her feet.  She is on B complex and alpha lipoic acid.  Last foot exam 09/20/2023-[facility].  On ASA 81.  Pt has FH of DM in father, PGM, 2 brothers, nephews.  She works at [employer].  ROS: + See HPI  I reviewed pt's medications, allergies, PMH, social hx, family hx, and changes were documented in the history of present illness. Otherwise, unchanged from my initial visit note.  Past Medical History:  Diagnosis Date   Anxiety    Arthritis    on meds   Asthma    uses  inhaler PRN   Deviated septum 08/16/2018   Diabetes mellitus without complication (HCC)    on meds   Family history of early CAD 04/11/2017   Hyperlipidemia 04/11/2017   on meds   Hypertension    on meds   Seasonal allergies    PSx H: - multiple eye Sx's: [id number] - C-section 1996 - Gall Bladder Sx 1984  Social History   Social History   Marital status: Married    Spouse name: N/A   Number of children: 1   Occupational History   Glass blower/designer   Social History Main Topics   Smoking status: Never Smoker   Smokeless tobacco: Never Used   Alcohol use No   Drug use: No   Current Outpatient Medications on File Prior to Visit  Medication Sig Dispense Refill   albuterol (VENTOLIN HFA) 108 (90 Base) MCG/ACT inhaler INHALE 2 PUFFS INTO THE LUNGS EVERY 6 HOURS AS NEEDED FOR WHEEZING OR SHORTNESS OF BREATH 6.7 g 1   Alpha-Lipoic Acid 600 MG CAPS Take 600 capsules by mouth daily.     APPLE CIDER VINEGAR PO Take 2 capsules by mouth 2 (two) times daily with a meal.     azelastine (ASTELIN) 0.1 % nasal spray Place 2 sprays into both nostrils 2 (two) times daily as needed for rhinitis. Use in each nostril as directed 90 mL 1   b complex vitamins capsule Take 1 capsule by mouth daily.     benzonatate (TESSALON) 200 MG capsule Take 1 capsule (200 mg total) by mouth 2 (two) times daily as needed for cough. 20 capsule 0   Berberine Chloride (BERBERINE HCI PO) Take 1 tablet by mouth with breakfast, with lunch, and with evening meal.     CINNAMON PO Take 2 capsules by mouth 2 (two) times daily with a meal.     CO-ENZYME Q-10 PO Take 200 mg by mouth daily.     COLLAGEN PO Take 6 capsules by mouth daily.     Continuous Blood Gluc Sensor (FREESTYLE LIBRE 3 SENSOR) MISC APPLY EVERY 14 (FOURTEEN) DAYS. 6 each 3   Cranberry 125 MG TABS Take 1 tablet by mouth daily.     cyclobenzaprine (FLEXERIL) 5 MG tablet Take 1 tablet (5 mg total) by mouth 3 (three) times daily as needed for muscle  spasms. 60 tablet 2   doxycycline (VIBRA-TABS) 100 MG tablet Take 1 tablet (100 mg total) by mouth 2 (two) times daily. 20 tablet 0   Dulaglutide (TRULICITY) 3 MG/0.5ML SOAJ INJECT THE CONTENTS OF ONE PEN  SUBCUTANEOUSLY WEEKLY AS  DIRECTED 6 mL 3   FARXIGA 10 MG TABS tablet TAKE 1 TABLET(10 MG) BY MOUTH DAILY BEFORE BREAKFAST 90 tablet 3   fluconazole (DIFLUCAN) 150 MG tablet Take one tablet today; may repeat in 3 days if symptoms persist 2 tablet  0   fluticasone (FLONASE) 50 MCG/ACT nasal spray SHAKE LIQUID AND USE 2 SPRAYS IN EACH NOSTRIL IN THE MORNING AND AT BEDTIME 16 g 1   glipiZIDE (GLUCOTROL) 5 MG tablet Take 2 tablets by mouth before breakfast and 2 tablet before larger meal. 360 tablet 2   glucose blood (FREESTYLE TEST STRIPS) test strip Use once a day - for freestyle libre 100 each 3   levocetirizine (XYZAL ALLERGY 24HR) 5 MG tablet Take 5 mg by mouth every evening.     losartan (COZAAR) 50 MG tablet Take 0.5 tablets (25 mg total) by mouth daily. 1/2 tab daily     Magnesium Oxide (MAG-200 PO) Take 1 tablet by mouth daily.     metFORMIN (GLUCOPHAGE-XR) 500 MG 24 hr tablet TAKE 1000 MG BY MOUTH DAILY (Patient taking differently: Take 500 mg by mouth 2 (two) times daily with a meal.) 180 tablet 3   montelukast (SINGULAIR) 10 MG tablet TAKE 1 TABLET(10 MG) BY MOUTH AT BEDTIME 90 tablet 1   Multiple Vitamin (THERA) TABS Take 1 tablet by mouth daily.     Omega-3 Fatty Acids (FISH OIL) 1000 MG CAPS Take 1 capsule by mouth daily.     PREVIDENT 5000 BOOSTER PLUS 1.1 % PSTE SMARTSIG:To Teeth PRN     sertraline (ZOLOFT) 100 MG tablet Take 1 tablet (100 mg total) by mouth daily. 90 tablet 3   traZODone (DESYREL) 100 MG tablet Take 1 tablet (100 mg total) by mouth at bedtime. 90 tablet 3   VITAMIN D, CHOLECALCIFEROL, PO Take 1 tablet by mouth daily at 6 (six) AM.     No current facility-administered medications on file prior to visit.   Allergies  Allergen Reactions   Humalog [Insulin Lispro]  Other (See Comments)    Muscle cramps/aches   Prednisone Other (See Comments)    Increased BS, PT IS DIABETIC AND WILL ALTER HER GLUCOSE READINGS   Statins Other (See Comments)    Muscle cramps/aches   Family History  Problem Relation Age of Onset   Ovarian cancer Mother    Arthritis Mother    Cancer Mother    Depression Mother    Heart attack Father    Kidney failure Father    Heart disease Father    Hyperlipidemia Father    Kidney disease Father    Diabetes Father    Sudden Cardiac Death Brother    Alcohol abuse Brother    Asthma Brother    Diabetes Brother    Early death Brother    Heart disease Brother    Hyperlipidemia Brother    Hypertension Brother    Heart attack Brother    Diabetes Brother    Heart disease Brother    Hyperlipidemia Brother    Ovarian cancer Maternal Grandmother    Arthritis Maternal Grandmother    Cancer Maternal Grandmother    COPD Maternal Grandmother    Parkinson's disease Maternal Grandfather    Heart failure Paternal Grandmother    Breast cancer Neg Hx    Colon polyps Neg Hx    Colon cancer Neg Hx    Esophageal cancer Neg Hx    Stomach cancer Neg Hx    Rectal cancer Neg Hx    PE: There were no vitals taken for this visit.  Wt Readings from Last 3 Encounters:  09/20/23 147 lb (66.7 kg)  07/08/23 150 lb (68 kg)  06/20/23 150 lb (68 kg)   Constitutional:  in NAD  The physical exam was not  performed (virtual visit).  ASSESSMENT: 1. 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 HL  PLAN:  1. Patient with longstanding, fairly well controlled type 2 diabetes on metformin, SGLT2 inhibitor, sulfonylurea and weekly GLP-1 receptor agonist, with improved control at last visit.  At that time sugars were well-controlled, stable and fluctuating within the target range.  She had some higher blood sugars after breakfast and then improving later in the day.  She was also having some lows at  night and she stopped her evening metformin in the week prior to our visit.  She felt that her sugars were higher afterwards.  I advised her about taking 2 tablets of metformin with breakfast but we discussed about stopping glipizide extended release in the morning as she was occasionally dropping her sugars during the day, especially before dinner.  We did discuss that if the sugars increase afterwards, we could add back a low-dose glipizide ER, 2.5 mg weekly.  We continued the rest of the regimen at that time.  HbA1c was 6.2%, at goal.  Since then, earlier this month, she had another HbA1c which was slightly higher, at 6.6%. -Reviewing her CGM tracings for the last 1 or 2 weeks, the sugars appear to be slightly higher than before midday, which is a pattern that she always had.  The sugars start to increase even without her eating or drinking coffee in the morning.  She rearranged her metformin and glipizide doses since last visit.  She was also on Sudafed which increased her blood sugars but she realized this and stopped a week ago.  At today's visit we discussed about possibly switching from Trulicity to Ozempic, for a stronger effect on blood sugars.  We did discuss that after making this change, we may need to back off the glipizide to avoid low blood sugars.  I advised her to let me know. - I suggested to:  Patient Instructions  Please continue: - Metformin ER 500 mg with breakfast and lunch - Farxiga 10 mg before b'fast - Glipizide 10 mg before b'fast and 5 mg before lunch and dinner  Try to change from Trulicity to Ozempic 0.5 mg weekly.  Please return in 4-6 months.   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 HL -I reviewed patient's lipid panel from 09/2022:  LDL above our target of less than 70, otherwise fractions at goal.  She has more recent labs checked in [facility] system, which have not been resulted yet: Lab Results  Component Value Date   CHOL WILL FOLLOW 09/20/2023   HDL WILL FOLLOW 09/20/2023   LDLCALC WILL FOLLOW 09/20/2023   TRIG WILL FOLLOW 09/20/2023   CHOLHDL WILL FOLLOW 09/20/2023  -She had muscle aches and bone pain from statins and muscle pains from Zetia even at the low dose of 5 mg daily. -She started to go to the lipid clinic in the past but did not go recently. I advised her to reestablish care with them, as she could not go previously due to being busy with her husband. -PAs for several cholesterol medications were denied - an appeal was sent with information about her brother dying at 43 from massive  heart attack and his father dying in his 40s from heart disease.   Carlus Pavlov, MD PhD [facility]

## 2023-10-01 LAB — CBC WITH DIFFERENTIAL/PLATELET
Basophils Absolute: 0 10*3/uL (ref 0.0–0.2)
Basos: 0 %
EOS (ABSOLUTE): 0.8 10*3/uL — ABNORMAL HIGH (ref 0.0–0.4)
Eos: 17 %
Hematocrit: 39.9 % (ref 34.0–46.6)
Hemoglobin: 12.7 g/dL (ref 11.1–15.9)
Immature Grans (Abs): 0 10*3/uL (ref 0.0–0.1)
Immature Granulocytes: 0 %
Lymphocytes Absolute: 1.5 10*3/uL (ref 0.7–3.1)
Lymphs: 29 %
MCH: 27.3 pg (ref 26.6–33.0)
MCHC: 31.8 g/dL (ref 31.5–35.7)
MCV: 86 fL (ref 79–97)
Monocytes Absolute: 0.3 10*3/uL (ref 0.1–0.9)
Monocytes: 7 %
Neutrophils Absolute: 2.4 10*3/uL (ref 1.4–7.0)
Neutrophils: 47 %
Platelets: 306 10*3/uL (ref 150–450)
RBC: 4.65 x10E6/uL (ref 3.77–5.28)
RDW: 13.4 % (ref 11.7–15.4)
WBC: 5.1 10*3/uL (ref 3.4–10.8)

## 2023-10-01 LAB — HEMOGLOBIN A1C
Est. average glucose Bld gHb Est-mCnc: 143 mg/dL
Hgb A1c MFr Bld: 6.6 % — ABNORMAL HIGH (ref 4.8–5.6)

## 2023-10-01 LAB — MICROALBUMIN / CREATININE URINE RATIO

## 2023-10-01 LAB — LIPID PANEL

## 2023-10-01 LAB — COMPREHENSIVE METABOLIC PANEL

## 2023-10-01 LAB — TSH

## 2023-10-10 ENCOUNTER — Other Ambulatory Visit: Payer: Self-pay

## 2023-10-10 ENCOUNTER — Telehealth: Payer: Self-pay | Admitting: Family Medicine

## 2023-10-10 DIAGNOSIS — E1169 Type 2 diabetes mellitus with other specified complication: Secondary | ICD-10-CM

## 2023-10-10 DIAGNOSIS — E1142 Type 2 diabetes mellitus with diabetic polyneuropathy: Secondary | ICD-10-CM

## 2023-10-10 NOTE — Telephone Encounter (Signed)
 Called pt and informed her that new lab draws are needed due to a lab error. I was informed of this by the phlebotomist. Melissa.

## 2023-10-12 ENCOUNTER — Ambulatory Visit: Payer: Managed Care, Other (non HMO) | Admitting: Family

## 2023-10-13 ENCOUNTER — Encounter: Payer: Self-pay | Admitting: Family Medicine

## 2023-10-13 ENCOUNTER — Ambulatory Visit: Payer: Managed Care, Other (non HMO) | Admitting: Family Medicine

## 2023-10-13 VITALS — BP 120/68 | HR 80 | Temp 97.8°F | Ht 69.0 in | Wt 145.5 lb

## 2023-10-13 DIAGNOSIS — R051 Acute cough: Secondary | ICD-10-CM | POA: Diagnosis not present

## 2023-10-13 MED ORDER — AZITHROMYCIN 250 MG PO TABS: ORAL_TABLET | ORAL | 0 refills | Status: DC

## 2023-10-13 MED ORDER — PREDNISONE 20 MG PO TABS: 40.0000 mg | ORAL_TABLET | Freq: Every day | ORAL | 0 refills | Status: AC

## 2023-10-13 MED ORDER — PROMETHAZINE-DM 6.25-15 MG/5ML PO SYRP: 5.0000 mL | ORAL_SOLUTION | Freq: Four times a day (QID) | ORAL | 0 refills | Status: DC | PRN

## 2023-10-13 NOTE — Progress Notes (Signed)
   Subjective:    Patient ID: Charlotte Horn, female    DOB: 10-24-1960, 63 y.o.   MRN: [id number]  HPI Cough- pt reports she was tx'ing sxs w/ sudafed starting in September. Pt was tx'd w/ Doxycycline  on 12/17.  Has albuterol  to use prn due to hx of asthma but does not have a controller inhaler.  She is on Singulair .  Pt reports cough is productive.  No fevers.  + sick contacts.  Reports some SOB and wheezing.   Review of Systems For ROS see HPI     Objective:   Physical Exam Vitals reviewed.  Constitutional:      General: She is not in acute distress.    Appearance: Normal appearance. She is not ill-appearing.  HENT:     Head: Normocephalic and atraumatic.     Right Ear: Tympanic membrane and ear canal normal.     Left Ear: Tympanic membrane and ear canal normal.     Nose: Congestion present. No rhinorrhea.  Eyes:     Extraocular Movements: Extraocular movements intact.     Conjunctiva/sclera: Conjunctivae normal.  Cardiovascular:     Rate and Rhythm: Normal rate and regular rhythm.  Pulmonary:     Effort: Pulmonary effort is normal.     Breath sounds: No wheezing or rhonchi.     Comments: Hacking cough to the point of gagging throughout visit, pt speaks clearly and without SOB between coughing fits Musculoskeletal:     Cervical back: Neck supple.  Lymphadenopathy:     Cervical: No cervical adenopathy.  Skin:    General: Skin is warm and dry.  Neurological:     Mental Status: She is alert.           Assessment & Plan:  Cough- new to provider, ongoing for pt.  She reports that she has been coughing since September.  Was tx'd in December w/ Doxycycline  which improved her sinuses and the color of her sputum but she continues to cough.  She has a hx of asthma but is not on a ICS.  No crackles or wheezes heard on exam.  Given her hx of asthma, will start steroid burst (discussed this w/ her since it is on her allergy list).  Will also start Zpack for both atypical  coverage and its anti-inflammatory properties.  Pt reports that 'Zpacks, augmentin , amoxicillin  don't work for me.  They don't treat my sinuses'.  Discussed that we're not treating a sinus infxn today but rather her cough/bronchitis.  Cough syrup prn.  Reviewed supportive care and red flags that should prompt return.  Pt expressed understanding and is in agreement w/ plan.

## 2023-10-13 NOTE — Patient Instructions (Signed)
 Follow up as needed or as scheduled START the Zpack as directed to cover for possible mycoplasma TAKE the Prednisone  40mg  (2 tabs) daily x5 days Make sure to watch your diet as your sugars will go up on the Prednisone  USE the Albuterol  as needed ADD the cough syrup as needed Drink LOTS of water Call with any questions or concerns Hang in there!

## 2023-10-17 ENCOUNTER — Other Ambulatory Visit: Payer: Self-pay | Admitting: Internal Medicine

## 2023-11-04 ENCOUNTER — Telehealth: Payer: Self-pay | Admitting: Gastroenterology

## 2023-11-04 NOTE — Telephone Encounter (Signed)
Inbound call from patient returning phone call. Wanted to inform of 11:00 am on Wednesday 2/5. Please advise, thank you

## 2023-11-04 NOTE — Telephone Encounter (Signed)
Pt was calling back regarding an appt for her father.

## 2023-12-12 ENCOUNTER — Other Ambulatory Visit (HOSPITAL_BASED_OUTPATIENT_CLINIC_OR_DEPARTMENT_OTHER): Payer: Self-pay

## 2023-12-23 ENCOUNTER — Encounter: Payer: Self-pay | Admitting: Internal Medicine

## 2023-12-23 ENCOUNTER — Ambulatory Visit: Admitting: Internal Medicine

## 2023-12-23 VITALS — BP 120/60 | HR 90 | Ht 69.0 in | Wt 143.6 lb

## 2023-12-23 DIAGNOSIS — Z7984 Long term (current) use of oral hypoglycemic drugs: Secondary | ICD-10-CM | POA: Diagnosis not present

## 2023-12-23 DIAGNOSIS — E7800 Pure hypercholesterolemia, unspecified: Secondary | ICD-10-CM

## 2023-12-23 DIAGNOSIS — E1142 Type 2 diabetes mellitus with diabetic polyneuropathy: Secondary | ICD-10-CM

## 2023-12-23 DIAGNOSIS — E1165 Type 2 diabetes mellitus with hyperglycemia: Secondary | ICD-10-CM

## 2023-12-23 DIAGNOSIS — Z7985 Long-term (current) use of injectable non-insulin antidiabetic drugs: Secondary | ICD-10-CM | POA: Diagnosis not present

## 2023-12-23 LAB — POCT GLYCOSYLATED HEMOGLOBIN (HGB A1C): Hemoglobin A1C: 6.7 % — AB (ref 4.0–5.6)

## 2023-12-23 MED ORDER — OZEMPIC (1 MG/DOSE) 4 MG/3ML ~~LOC~~ SOPN: 1.0000 mg | PEN_INJECTOR | SUBCUTANEOUS | 3 refills | Status: DC

## 2023-12-23 NOTE — Progress Notes (Addendum)
 Patient ID: Charlotte Horn, female   DOB: 03/13/1961, 63 y.o.   MRN: [id number]   HPI: Charlotte Horn is a 63 y.o.-year-old female,   returning for follow-up for DM2, dx in 2004, non-insulin-dependent, uncontrolled, without long-term complications.  Last visit 3 months ago (virtual).  Interim history: + increased urination - drinks a lot of water, + blurry vision - works on computer, no nausea, chest pain. She has a lot of stress  - changed jobs. 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   Reviewed HbA1c levels: Lab Results  Component Value Date   HGBA1C 6.6 (H) 09/20/2023   HGBA1C 6.2 (A) 03/29/2023   HGBA1C 6.2 03/28/2023  02/15/2018: HbA1c 7.0% 05/12/2017: HbA1c 9.1% 02/01/2017: HbA1c 7.0%  She is on: - Metformin ER 1000 >> 500 mg 2x a day (decreased 2/2 stomach pbs) >> .Marland KitchenMarland Kitchen1000 mg with breakfast >> 500 mg in am and 500 mg in am  >> stopped 03/2023 - Glipizide 5 >> 10 mg before b'fast (+/- 5 mg before a larger lunch) >> 10 mg before breakfast and 5 mg before lunch and 5 mg before dinner - Farxiga 5 mg in am-added 05/2020 >> 10 mg daily - Trulicity 1.5 >> 3 mg weekly >> Ozempic 0.5 mg weekly (changed 09/2023) She tried Byetta, Bydureon (1 year) - but had diarrhea. She had very low CBGs with regular Glipizide.  Also, she had hot flashes from the glipizide especially after taking a hot shower.  A freestyle libre 2 PA was denied by insurance on 09/17/2021. She is paying for it out of pocket.  The interpretation of her CGM is not covered by insurance. She checks her sugars more than 4 times a day with her CGM:  Previously:    Lowest sugar was upper 60s >> 40s >> 70s; she has hypoglycemia awareness in the 60s. Highest sugar was 350 (eating out) >> .Marland KitchenMarland Kitchen200s >> 282 >> 250s.  Glucometer: One Touch Ultra mini  Pt's meals are: - Breakfast: b'fast bar (<15g carbs), almonds, cereal >> yoghurt + granola, nuts or skips - Lunch:  veggies, popcorn - Dinner: meat +  veggies + rarely starch Diet sodas usually lower her sugars.  No CKD: Lab Results  Component Value Date   BUN 17 09/16/2022   BUN 13 09/22/2021   CREATININE 0.69 09/16/2022   CREATININE 0.82 09/22/2021   Lab Results  Component Value Date   MICRALBCREAT 9 09/16/2022  On losartan.  She has hyperlipidemia: Lab Results  Component Value Date   CHOL CANCELED 09/20/2023   HDL 64 09/16/2022   LDLCALC 90 09/16/2022   TRIG 93 09/16/2022   CHOLHDL 2.7 09/16/2022  Previously:  08/28/2019: 240/159/57/154 02/01/2017: 253/166/59/161  On co-Q10 and fish oil. Nexletol PA was started by the lipid clinic >> denied.  Also, a PCSK9 inhibitor was denied by her insurance. She started to go to the lipid clinic, but not recently.  - last eye exam was 09/22/2023: No DR. Dr. Hazle Quant.  She has mild cataracts.  - +  numbness and tingling in her feet.  She is on B complex and alpha lipoic acid.  Last foot exam 09/20/2023-[facility].  On ASA 81.  Pt has FH of DM in father, PGM, 2 brothers, nephews.  She works at [employer].  ROS: + See HPI  I reviewed pt's medications, allergies, PMH, social hx, family hx, and changes were documented in the history of present illness. Otherwise, unchanged from my initial visit note.  Past Medical History:  Diagnosis Date   Anxiety    Arthritis    on meds   Asthma    uses inhaler PRN   Deviated septum 08/16/2018   Diabetes mellitus without complication (HCC)    on meds   Family history of early CAD 04/11/2017   Hyperlipidemia 04/11/2017   on meds   Hypertension    on meds   Seasonal allergies    PSx H: - multiple eye Sx's: [id number] - C-section 1996 - Gall Bladder Sx 1984  Social History   Social History   Marital status: Married    Spouse name: N/A   Number of children: 1   Occupational History   Glass blower/designer   Social History Main Topics   Smoking status: Never Smoker   Smokeless tobacco: Never Used   Alcohol use No   Drug  use: No   Current Outpatient Medications on File Prior to Visit  Medication Sig Dispense Refill   albuterol (VENTOLIN HFA) 108 (90 Base) MCG/ACT inhaler INHALE 2 PUFFS INTO THE LUNGS EVERY 6 HOURS AS NEEDED FOR WHEEZING OR SHORTNESS OF BREATH 6.7 g 1   Alpha-Lipoic Acid 600 MG CAPS Take 600 capsules by mouth daily.     APPLE CIDER VINEGAR PO Take 2 capsules by mouth 2 (two) times daily with a meal.     azelastine (ASTELIN) 0.1 % nasal spray Place 2 sprays into both nostrils 2 (two) times daily as needed for rhinitis. Use in each nostril as directed 90 mL 1   azithromycin (ZITHROMAX) 250 MG tablet 2 tabs on day 1, 1 tab on day 2-5 6 tablet 0   b complex vitamins capsule Take 1 capsule by mouth daily.     benzonatate (TESSALON) 200 MG capsule Take 1 capsule (200 mg total) by mouth 2 (two) times daily as needed for cough. 20 capsule 0   Berberine Chloride (BERBERINE HCI PO) Take 1 tablet by mouth with breakfast, with lunch, and with evening meal.     CINNAMON PO Take 2 capsules by mouth 2 (two) times daily with a meal.     CO-ENZYME Q-10 PO Take 200 mg by mouth daily.     COLLAGEN PO Take 6 capsules by mouth daily.     Continuous Glucose Sensor (FREESTYLE LIBRE 3 SENSOR) MISC APPLY EVERY 14 (FOURTEEN) DAYS. 6 each 3   Cranberry 125 MG TABS Take 1 tablet by mouth daily.     cyclobenzaprine (FLEXERIL) 5 MG tablet Take 1 tablet (5 mg total) by mouth 3 (three) times daily as needed for muscle spasms. 60 tablet 2   doxycycline (VIBRA-TABS) 100 MG tablet Take 1 tablet (100 mg total) by mouth 2 (two) times daily. 20 tablet 0   Dulaglutide (TRULICITY) 3 MG/0.5ML SOAJ INJECT THE CONTENTS OF ONE PEN  SUBCUTANEOUSLY WEEKLY AS  DIRECTED 6 mL 3   FARXIGA 10 MG TABS tablet TAKE 1 TABLET(10 MG) BY MOUTH DAILY BEFORE BREAKFAST 90 tablet 3   fluconazole (DIFLUCAN) 150 MG tablet Take one tablet today; may repeat in 3 days if symptoms persist 2 tablet 0   fluticasone (FLONASE) 50 MCG/ACT nasal spray SHAKE LIQUID AND  USE 2 SPRAYS IN EACH NOSTRIL IN THE MORNING AND AT BEDTIME 16 g 1   glipiZIDE (GLUCOTROL) 5 MG tablet Take 2 tablets by mouth before breakfast and 2 tablet before larger meal. 360 tablet 2   glucose blood (FREESTYLE TEST STRIPS) test strip Use once a day - for freestyle libre 100 each 3   levocetirizine (  XYZAL ALLERGY 24HR) 5 MG tablet Take 5 mg by mouth every evening.     losartan (COZAAR) 50 MG tablet Take 0.5 tablets (25 mg total) by mouth daily. 1/2 tab daily     Magnesium Oxide (MAG-200 PO) Take 1 tablet by mouth daily.     metFORMIN (GLUCOPHAGE-XR) 500 MG 24 hr tablet TAKE 1000 MG BY MOUTH DAILY (Patient taking differently: Take 500 mg by mouth 2 (two) times daily with a meal.) 180 tablet 3   montelukast (SINGULAIR) 10 MG tablet TAKE 1 TABLET(10 MG) BY MOUTH AT BEDTIME 90 tablet 1   Multiple Vitamin (THERA) TABS Take 1 tablet by mouth daily.     Omega-3 Fatty Acids (FISH OIL) 1000 MG CAPS Take 1 capsule by mouth daily.     PREVIDENT 5000 BOOSTER PLUS 1.1 % PSTE SMARTSIG:To Teeth PRN     promethazine-dextromethorphan (PROMETHAZINE-DM) 6.25-15 MG/5ML syrup Take 5 mLs by mouth 4 (four) times daily as needed. 180 mL 0   Semaglutide,0.25 or 0.5MG /DOS, 2 MG/3ML SOPN Inject 0.5 mg into the skin once a week. 9 mL 3   sertraline (ZOLOFT) 100 MG tablet Take 1 tablet (100 mg total) by mouth daily. 90 tablet 3   traZODone (DESYREL) 100 MG tablet Take 1 tablet (100 mg total) by mouth at bedtime. 90 tablet 3   VITAMIN D, CHOLECALCIFEROL, PO Take 1 tablet by mouth daily at 6 (six) AM.     No current facility-administered medications on file prior to visit.   Allergies  Allergen Reactions   Humalog [Insulin Lispro] Other (See Comments)    Muscle cramps/aches   Prednisone Other (See Comments)    Increased BS, PT IS DIABETIC AND WILL ALTER HER GLUCOSE READINGS   Statins Other (See Comments)    Muscle cramps/aches   Family History  Problem Relation Age of Onset   Ovarian cancer Mother    Arthritis  Mother    Cancer Mother    Depression Mother    Heart attack Father    Kidney failure Father    Heart disease Father    Hyperlipidemia Father    Kidney disease Father    Diabetes Father    Sudden Cardiac Death Brother    Alcohol abuse Brother    Asthma Brother    Diabetes Brother    Early death Brother    Heart disease Brother    Hyperlipidemia Brother    Hypertension Brother    Heart attack Brother    Diabetes Brother    Heart disease Brother    Hyperlipidemia Brother    Ovarian cancer Maternal Grandmother    Arthritis Maternal Grandmother    Cancer Maternal Grandmother    COPD Maternal Grandmother    Parkinson's disease Maternal Grandfather    Heart failure Paternal Grandmother    Breast cancer Neg Hx    Colon polyps Neg Hx    Colon cancer Neg Hx    Esophageal cancer Neg Hx    Stomach cancer Neg Hx    Rectal cancer Neg Hx    PE: BP 120/60   Pulse 90   Ht 5\' 9"  (1.753 m)   Wt 143 lb 9.6 oz (65.1 kg)   SpO2 94%   BMI 21.21 kg/m   Wt Readings from Last 10 Encounters:  12/23/23 143 lb 9.6 oz (65.1 kg)  10/13/23 145 lb 8 oz (66 kg)  09/20/23 147 lb (66.7 kg)  07/08/23 150 lb (68 kg)  06/20/23 150 lb (68 kg)  04/18/23 149 lb  3.2 oz (67.7 kg)  03/29/23 151 lb 6.4 oz (68.7 kg)  12/24/22 153 lb 2 oz (69.5 kg)  09/16/22 147 lb 6.4 oz (66.9 kg)  09/14/22 150 lb 12.8 oz (68.4 kg)   Constitutional: normal weight, in NAD Eyes:  EOMI, no exophthalmos ENT: no neck masses, no cervical lymphadenopathy Cardiovascular: RRR, No MRG Respiratory: CTA B Musculoskeletal: no deformities Skin:no rashes Neurological: no tremor with outstretched hands  ASSESSMENT: 1. 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 HL  PLAN:  1. Patient with longstanding, fairly well-controlled type 2 diabetes, on metformin, SGLT2 inhibitor, sulfonylurea, and weekly GLP-1 receptor agonist, with fair control.  At last visit, HbA1c was 6.6%.   At that time, she still had higher blood sugars midday, a pattern that was consistent for her.  I suggested to switch from Trulicity to Ozempic but did not change the rest of the regimen. -At today's visit, despite switching from Trulicity to Ozempic, she has the same pattern in the blood sugars, at goal during the night, increasing in the middle of the day and improving at night.  This pattern is present all days of the week, including weekends.  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  At today's visit we discussed about possibly increasing the Ozempic dose to 1 mg weekly but I did advise her to try her best to have a good food intake so that she does not lose any more weight.  I am hoping that after increasing the dose of Ozempic, we can decrease and even stop her glipizide.  I advised her to start decreasing the doses if sugars improve.  Will continue the rest of the regimen for now. - I suggested to:  Patient Instructions  Please continue: - Metformin ER 500 mg with breakfast and lunch - Farxiga 10 mg before b'fast - Glipizide 10 mg before b'fast and 5 mg before lunch and dinner  Try to increase: - Ozempic 1 mg weekly  If sugars improve, try to come off Glipizide.  Please return in 6 months.   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 HL -I reviewed patient's lipid panel from 09/2022: LDL above our target of less than 70, otherwise fractions at goal.  She has more recent labs checked in [place] system, which have not been resulted yet: Lab Results  Component Value Date   CHOL CANCELED  09/20/2023   HDL 64 09/16/2022   LDLCALC 90 09/16/2022   TRIG 93 09/16/2022   CHOLHDL 2.7 09/16/2022  -She had muscle aches and bone pain from statins and muscle pains from Zetia even at the low dose of 5 mg daily -She started to go to the lipid clinic in the past but did not go recently. I advised her to reestablish care with them, as she could not go previously due to being busy with her husband. -PAs for several cholesterol medications were denied - an appeal was sent with information about her brother dying at 68  from massive heart attack and his father dying in his 25s from heart disease.  -Will check a lipid panel-ordered this to [facility] per her preference  Orders Placed This Encounter  Procedures   Microalbumin / creatinine urine ratio   Lipid panel   Comprehensive metabolic panel   POCT glycosylated hemoglobin (Hb A1C)   Component     Latest Ref Rng 12/30/2023  Cholesterol, Total     100 - 199 mg/dL 478   Triglycerides     0 - 149 mg/dL 295   HDL Cholesterol     >39 mg/dL 61   VLDL Cholesterol Cal     5 - 40 mg/dL 19   LDL Chol Calc (NIH)     0 - 99 mg/dL 92   Total CHOL/HDL Ratio     0.0 - 4.4 ratio 2.8   Glucose     70 - 99 mg/dL 78   BUN     8 - 27 mg/dL 12   Creatinine     6.21 - 1.00 mg/dL 3.08   BUN/Creatinine Ratio     12 - 28  18   Sodium     134 - 144 mmol/L 136   Potassium     3.5 - 5.2 mmol/L 4.0   Chloride     96 - 106 mmol/L 97   CO2     20 - 29 mmol/L 23   Calcium     8.7 - 10.3 mg/dL 9.2   Total Protein     6.0 - 8.5 g/dL 6.8   Albumin     3.9 - 4.9 g/dL 4.5   Globulin, Total     1.5 - 4.5 g/dL 2.3   Total Bilirubin     0.0 - 1.2 mg/dL <6.5   Alkaline Phosphatase     44 - 121 IU/L 83   AST     0 - 40 IU/L 14   ALT     0 - 32 IU/L 12   eGFR     >59 mL/min/1.73 99   Creatinine, Urine     Not Estab. mg/dL 78.4   Microalbumin, Urine     Not Estab. ug/mL 6.4   MICROALB/CREAT RATIO     0 - 29 mg/g creat 20   Labs are at goal with  the exception of a slightly higher LDL, above our target of less than 70.  Discussed about seeing the lipid clinic.  Carlus Pavlov, MD PhD [facility] Endocrinology

## 2023-12-23 NOTE — Patient Instructions (Addendum)
 Please continue: - Metformin ER 500 mg with breakfast and lunch - Farxiga 10 mg before b'fast - Glipizide 10 mg before b'fast and 5 mg before lunch and dinner  Try to increase: - Ozempic 1 mg weekly  If sugars improve, try to come off Glipizide.  Please return in 6 months.

## 2023-12-30 ENCOUNTER — Other Ambulatory Visit: Payer: Self-pay | Admitting: Internal Medicine

## 2023-12-31 LAB — LIPID PANEL
Chol/HDL Ratio: 2.8 ratio (ref 0.0–4.4)
Cholesterol, Total: 172 mg/dL (ref 100–199)
HDL: 61 mg/dL (ref >39)
LDL Chol Calc (NIH): 92 mg/dL (ref 0–99)
Triglycerides: 103 mg/dL (ref 0–149)
VLDL Cholesterol Cal: 19 mg/dL (ref 5–40)

## 2023-12-31 LAB — MICROALBUMIN / CREATININE URINE RATIO
Creatinine, Urine: 31.7 mg/dL
Microalb/Creat Ratio: 20 mg/g{creat} (ref 0–29)
Microalbumin, Urine: 6.4 ug/mL

## 2023-12-31 LAB — COMPREHENSIVE METABOLIC PANEL WITH GFR
ALT: 12 IU/L (ref 0–32)
AST: 14 IU/L (ref 0–40)
Albumin: 4.5 g/dL (ref 3.9–4.9)
Alkaline Phosphatase: 83 IU/L (ref 44–121)
BUN/Creatinine Ratio: 18 (ref 12–28)
BUN: 12 mg/dL (ref 8–27)
Bilirubin Total: <0.2 mg/dL (ref 0.0–1.2)
CO2: 23 mmol/L (ref 20–29)
Calcium: 9.2 mg/dL (ref 8.7–10.3)
Chloride: 97 mmol/L (ref 96–106)
Creatinine, Ser: 0.66 mg/dL (ref 0.57–1.00)
Globulin, Total: 2.3 g/dL (ref 1.5–4.5)
Glucose: 78 mg/dL (ref 70–99)
Potassium: 4 mmol/L (ref 3.5–5.2)
Sodium: 136 mmol/L (ref 134–144)
Total Protein: 6.8 g/dL (ref 6.0–8.5)
eGFR: 99 mL/min/{1.73_m2} (ref >59)

## 2024-01-03 ENCOUNTER — Encounter: Payer: Self-pay | Admitting: Internal Medicine

## 2024-02-04 ENCOUNTER — Encounter: Payer: Self-pay | Admitting: Internal Medicine

## 2024-02-20 ENCOUNTER — Other Ambulatory Visit (HOSPITAL_BASED_OUTPATIENT_CLINIC_OR_DEPARTMENT_OTHER): Payer: Self-pay

## 2024-02-26 ENCOUNTER — Other Ambulatory Visit: Payer: Self-pay | Admitting: Allergy & Immunology

## 2024-03-24 ENCOUNTER — Other Ambulatory Visit (HOSPITAL_BASED_OUTPATIENT_CLINIC_OR_DEPARTMENT_OTHER): Payer: Self-pay

## 2024-05-14 ENCOUNTER — Encounter: Payer: Self-pay | Admitting: Internal Medicine

## 2024-05-14 DIAGNOSIS — E1165 Type 2 diabetes mellitus with hyperglycemia: Secondary | ICD-10-CM

## 2024-05-14 MED ORDER — METFORMIN HCL ER 500 MG PO TB24: 500.0000 mg | ORAL_TABLET | Freq: Three times a day (TID) | ORAL | 3 refills | Status: DC

## 2024-05-15 MED ORDER — METFORMIN HCL ER 500 MG PO TB24: 500.0000 mg | ORAL_TABLET | Freq: Three times a day (TID) | ORAL | 2 refills | Status: DC

## 2024-05-15 NOTE — Addendum Note (Signed)
 Addended by: CLEOTILDE ROLIN RAMAN on: 05/15/2024 04:39 PM   Modules accepted: Orders

## 2024-05-16 MED ORDER — METFORMIN HCL ER 500 MG PO TB24: 500.0000 mg | ORAL_TABLET | Freq: Two times a day (BID) | ORAL | 3 refills | Status: AC

## 2024-05-16 NOTE — Addendum Note (Signed)
 Addended by: CLEOTILDE ROLIN RAMAN on: 05/16/2024 10:41 AM   Modules accepted: Orders

## 2024-05-16 NOTE — Telephone Encounter (Signed)
 We received a fax from [facility] asking for clarification and so I called and spoke with the patient and she states she is now taking 1000 mg a day.   Refill sent.   Requested Prescriptions   Signed Prescriptions Disp Refills   metFORMIN  (GLUCOPHAGE -XR) 500 MG 24 hr tablet 180 tablet 3    Sig: Take 1 tablet (500 mg total) by mouth 2 (two) times daily with a meal. TAKE 1000 MG BY MOUTH DAILY    Authorizing Provider: TRIXIE FILE    Ordering User: CLEOTILDE ROLIN RAMAN

## 2024-05-24 ENCOUNTER — Other Ambulatory Visit: Payer: Self-pay | Admitting: Internal Medicine

## 2024-05-25 ENCOUNTER — Other Ambulatory Visit (HOSPITAL_BASED_OUTPATIENT_CLINIC_OR_DEPARTMENT_OTHER): Payer: Self-pay

## 2024-05-25 MED ORDER — GLIPIZIDE 5 MG PO TABS
ORAL_TABLET | ORAL | 2 refills | Status: DC
  Filled 2024-05-25: qty 120, 30d supply, fill #0
  Filled 2024-06-19: qty 120, 30d supply, fill #1
  Filled 2024-07-24: qty 120, 30d supply, fill #2
  Filled 2024-10-01: qty 120, 30d supply, fill #3

## 2024-06-19 ENCOUNTER — Other Ambulatory Visit (HOSPITAL_BASED_OUTPATIENT_CLINIC_OR_DEPARTMENT_OTHER): Payer: Self-pay

## 2024-06-29 ENCOUNTER — Ambulatory Visit: Admitting: Internal Medicine

## 2024-07-05 ENCOUNTER — Encounter: Payer: Self-pay | Admitting: Internal Medicine

## 2024-07-05 ENCOUNTER — Other Ambulatory Visit (HOSPITAL_BASED_OUTPATIENT_CLINIC_OR_DEPARTMENT_OTHER): Payer: Self-pay

## 2024-07-05 ENCOUNTER — Ambulatory Visit: Admitting: Internal Medicine

## 2024-07-05 VITALS — BP 120/60 | HR 78 | Ht 69.0 in | Wt 137.0 lb

## 2024-07-05 DIAGNOSIS — E1142 Type 2 diabetes mellitus with diabetic polyneuropathy: Secondary | ICD-10-CM

## 2024-07-05 DIAGNOSIS — E1165 Type 2 diabetes mellitus with hyperglycemia: Secondary | ICD-10-CM | POA: Diagnosis not present

## 2024-07-05 DIAGNOSIS — E7800 Pure hypercholesterolemia, unspecified: Secondary | ICD-10-CM | POA: Diagnosis not present

## 2024-07-05 DIAGNOSIS — Z7984 Long term (current) use of oral hypoglycemic drugs: Secondary | ICD-10-CM | POA: Diagnosis not present

## 2024-07-05 DIAGNOSIS — Z7985 Long-term (current) use of injectable non-insulin antidiabetic drugs: Secondary | ICD-10-CM

## 2024-07-05 LAB — POCT GLYCOSYLATED HEMOGLOBIN (HGB A1C): Hemoglobin A1C: 6.3 % — AB (ref 4.0–5.6)

## 2024-07-05 MED ORDER — FLUZONE 0.5 ML IM SUSY
0.5000 mL | PREFILLED_SYRINGE | Freq: Once | INTRAMUSCULAR | 0 refills | Status: AC
  Filled 2024-07-05: qty 0.5, 1d supply, fill #0

## 2024-07-05 MED ORDER — DAPAGLIFLOZIN PROPANEDIOL 10 MG PO TABS: 10.0000 mg | ORAL_TABLET | Freq: Every day | ORAL | 3 refills | Status: AC

## 2024-07-05 MED ORDER — COMIRNATY 30 MCG/0.3ML IM SUSY
0.3000 mL | PREFILLED_SYRINGE | Freq: Once | INTRAMUSCULAR | 0 refills | Status: AC
  Filled 2024-07-05: qty 0.3, 1d supply, fill #0

## 2024-07-05 MED ORDER — FREESTYLE LIBRE 3 PLUS SENSOR MISC: 1.0000 | 3 refills | Status: AC

## 2024-07-05 NOTE — Addendum Note (Signed)
 Addended by: CLEOTILDE ROLIN RAMAN on: 07/05/2024 04:35 PM   Modules accepted: Orders

## 2024-07-05 NOTE — Patient Instructions (Addendum)
 Please continue: - Metformin  ER 500 mg with breakfast and lunch - Farxiga  10 mg before b'fast - Glipizide  10 mg before b'fast and 5 mg before lunch and dinner - Ozempic  1 mg weekly  Please return in 6 months.

## 2024-07-05 NOTE — Progress Notes (Addendum)
 Patient ID: Charlotte Horn, female   DOB: 27-Mar-1961, 63 y.o.   MRN: [id number]   HPI: Charlotte Horn is a 63 y.o.-year-old female,  returning for follow-up for DM2, dx in 2004, non-insulin-dependent, uncontrolled, without long-term complications.  Last visit 6 months ago.  Interim history: No increased urination.  + blurry vision - dry eyes (uses drops), no nausea, chest pain.  Reviewed HbA1c levels: Lab Results  Component Value Date   HGBA1C 6.7 (A) 12/23/2023   HGBA1C 6.6 (H) 09/20/2023   HGBA1C 6.2 (A) 03/29/2023  02/15/2018: HbA1c 7.0% 05/12/2017: HbA1c 9.1% 02/01/2017: HbA1c 7.0%  She is on: - Metformin  ER 1000 >> 500 mg 2x a day (decreased 2/2 stomach pbs) >> .SABRASABRA1000 mg with breakfast >> 500 mg in am and 500 mg in am  >> stopped 03/2023 - Glipizide  5 >> 10 mg before b'fast (+/- 5 mg before a larger lunch) >> 10 mg before breakfast and 5 mg before lunch and 5 mg before dinner - Farxiga  5 mg in am-added 05/2020 >> 10 mg daily - Trulicity  1.5 >> 3 mg weekly >> Ozempic  0.5 mg weekly (changed 09/2023) She tried Byetta, Bydureon (1 year) - but had diarrhea. She had very low CBGs with regular Glipizide .  Also, she had hot flashes from the glipizide  especially after taking a hot shower.  A freestyle libre 2 PA was denied by insurance on 09/17/2021. She is paying for it out of pocket.  The interpretation of her CGM is not covered by insurance.  She checks her sugars more than 4 times a day with her CGM: Fasting: 82, 95-120 Post meal: Usually <180, but occasionally 260 after b'fast    Previously:  Previously:   Lowest sugar was upper 60s >> 40s >> 70s >> <70; she has hypoglycemia awareness in the 60s. Highest sugar was 350 (eating out) >> .SABRASABRA282 >> 250s >> 260s.  Glucometer: One Touch Ultra mini  Pt's meals are: - Breakfast: b'fast bar (<15g carbs), almonds, cereal >> yoghurt + granola, nuts or skips - Lunch:  veggies, popcorn - Dinner: meat + veggies +  rarely starch Diet sodas usually lower her sugars.  No CKD: Lab Results  Component Value Date   BUN 12 12/30/2023   BUN 17 09/16/2022   CREATININE 0.66 12/30/2023   CREATININE 0.69 09/16/2022   Lab Results  Component Value Date   MICRALBCREAT 20 12/30/2023   MICRALBCREAT 9 09/16/2022  On losartan .  She has hyperlipidemia: Lab Results  Component Value Date   CHOL 172 12/30/2023   HDL 61 12/30/2023   LDLCALC 92 12/30/2023   TRIG 103 12/30/2023   CHOLHDL 2.8 12/30/2023  Previously:  08/28/2019: 240/159/57/154 02/01/2017: 253/166/59/161  On co-Q10 and fish oil. Nexletol  PA was started by the lipid clinic >> denied.  Also, a PCSK9 inhibitor was denied by her insurance. She was going to the lipid clinic, but not recently.  - last eye exam was 09/22/2023: No DR. Dr. Camillo.  She has mild cataracts.  - +  numbness and tingling in her feet.  She is on B complex and alpha lipoic acid.  Last foot exam 09/20/2023-[facility].  On ASA 81.  Pt has FH of DM in father, PGM, 2 brothers, nephews.  She works at [employer].  ROS: + See HPI  I reviewed pt's medications, allergies, PMH, social hx, family hx, and changes were documented in the history of present illness. Otherwise, unchanged from my initial visit note.  Past Medical History:  Diagnosis Date  Anxiety    Arthritis    on meds   Asthma    uses inhaler PRN   Deviated septum 08/16/2018   Diabetes mellitus without complication (HCC)    on meds   Family history of early CAD 04/11/2017   Hyperlipidemia 04/11/2017   on meds   Hypertension    on meds   Seasonal allergies    PSx H: - multiple eye Sx's: [id number] - C-section 1996 - Gall Bladder Sx 1984  Social History   Social History   Marital status: Married    Spouse name: N/A   Number of children: 1   Occupational History   Glass blower/designer   Social History Main Topics   Smoking status: Never Smoker   Smokeless tobacco: Never Used   Alcohol use  No   Drug use: No   Current Outpatient Medications on File Prior to Visit  Medication Sig Dispense Refill   albuterol  (VENTOLIN  HFA) 108 (90 Base) MCG/ACT inhaler INHALE 2 PUFFS INTO THE LUNGS EVERY 6 HOURS AS NEEDED FOR WHEEZING OR SHORTNESS OF BREATH 6.7 g 1   Alpha-Lipoic Acid 600 MG CAPS Take 600 capsules by mouth daily.     APPLE CIDER VINEGAR PO Take 2 capsules by mouth 2 (two) times daily with a meal.     azelastine  (ASTELIN ) 0.1 % nasal spray Place 2 sprays into both nostrils 2 (two) times daily as needed for rhinitis. Use in each nostril as directed 90 mL 1   b complex vitamins capsule Take 1 capsule by mouth daily.     Berberine Chloride (BERBERINE HCI PO) Take 1 tablet by mouth with breakfast, with lunch, and with evening meal.     CINNAMON PO Take 2 capsules by mouth 2 (two) times daily with a meal.     CO-ENZYME Q-10 PO Take 200 mg by mouth daily.     COLLAGEN PO Take 6 capsules by mouth daily.     Continuous Glucose Sensor (FREESTYLE LIBRE 3 SENSOR) MISC APPLY EVERY 14 (FOURTEEN) DAYS. 6 each 3   Cranberry 125 MG TABS Take 1 tablet by mouth daily.     cyclobenzaprine  (FLEXERIL ) 5 MG tablet Take 1 tablet (5 mg total) by mouth 3 (three) times daily as needed for muscle spasms. 60 tablet 2   Dulaglutide  (TRULICITY ) 3 MG/0.5ML SOAJ INJECT THE CONTENTS OF ONE PEN  SUBCUTANEOUSLY WEEKLY AS  DIRECTED 6 mL 3   FARXIGA  10 MG TABS tablet TAKE 1 TABLET(10 MG) BY MOUTH DAILY BEFORE BREAKFAST 90 tablet 3   fluconazole  (DIFLUCAN ) 150 MG tablet Take one tablet today; may repeat in 3 days if symptoms persist 2 tablet 0   fluticasone  (FLONASE ) 50 MCG/ACT nasal spray SHAKE LIQUID AND USE 2 SPRAYS IN EACH NOSTRIL IN THE MORNING AND AT BEDTIME 16 g 1   glipiZIDE  (GLUCOTROL ) 5 MG tablet Take 2 tablets by mouth before breakfast and 2 tablet before larger meal. 360 tablet 2   glucose blood (FREESTYLE TEST STRIPS) test strip Use once a day - for freestyle libre 100 each 3   levocetirizine (XYZAL ALLERGY  24HR) 5 MG tablet Take 5 mg by mouth every evening.     losartan  (COZAAR ) 50 MG tablet Take 0.5 tablets (25 mg total) by mouth daily. 1/2 tab daily     Magnesium Oxide (MAG-200 PO) Take 1 tablet by mouth daily.     metFORMIN  (GLUCOPHAGE -XR) 500 MG 24 hr tablet Take 1 tablet (500 mg total) by mouth 2 (two) times daily with  a meal. TAKE 1000 MG BY MOUTH DAILY 180 tablet 3   montelukast  (SINGULAIR ) 10 MG tablet TAKE 1 TABLET(10 MG) BY MOUTH AT BEDTIME 90 tablet 1   Multiple Vitamin (THERA) TABS Take 1 tablet by mouth daily.     Omega-3 Fatty Acids (FISH OIL) 1000 MG CAPS Take 1 capsule by mouth daily.     PREVIDENT 5000 BOOSTER PLUS 1.1 % PSTE SMARTSIG:To Teeth PRN     Semaglutide , 1 MG/DOSE, (OZEMPIC , 1 MG/DOSE,) 4 MG/3ML SOPN Inject 1 mg into the skin once a week. 9 mL 3   sertraline  (ZOLOFT ) 100 MG tablet Take 1 tablet (100 mg total) by mouth daily. 90 tablet 3   traZODone  (DESYREL ) 100 MG tablet Take 1 tablet (100 mg total) by mouth at bedtime. 90 tablet 3   VITAMIN D , CHOLECALCIFEROL, PO Take 1 tablet by mouth daily at 6 (six) AM.     No current facility-administered medications on file prior to visit.   Allergies  Allergen Reactions   Humalog [Insulin Lispro] Other (See Comments)    Muscle cramps/aches   Prednisone  Other (See Comments)    Increased BS, PT IS DIABETIC AND WILL ALTER HER GLUCOSE READINGS   Statins Other (See Comments)    Muscle cramps/aches   Family History  Problem Relation Age of Onset   Ovarian cancer Mother    Arthritis Mother    Cancer Mother    Depression Mother    Heart attack Father    Kidney failure Father    Heart disease Father    Hyperlipidemia Father    Kidney disease Father    Diabetes Father    Sudden Cardiac Death Brother    Alcohol abuse Brother    Asthma Brother    Diabetes Brother    Early death Brother    Heart disease Brother    Hyperlipidemia Brother    Hypertension Brother    Heart attack Brother    Diabetes Brother    Heart  disease Brother    Hyperlipidemia Brother    Ovarian cancer Maternal Grandmother    Arthritis Maternal Grandmother    Cancer Maternal Grandmother    COPD Maternal Grandmother    Parkinson's disease Maternal Grandfather    Heart failure Paternal Grandmother    Breast cancer Neg Hx    Colon polyps Neg Hx    Colon cancer Neg Hx    Esophageal cancer Neg Hx    Stomach cancer Neg Hx    Rectal cancer Neg Hx    PE: BP 120/60   Pulse 78   Ht 5' 9 (1.753 m)   Wt 137 lb (62.1 kg)   SpO2 91%   BMI 20.23 kg/m   Wt Readings from Last 10 Encounters:  07/05/24 137 lb (62.1 kg)  12/23/23 143 lb 9.6 oz (65.1 kg)  10/13/23 145 lb 8 oz (66 kg)  09/20/23 147 lb (66.7 kg)  07/08/23 150 lb (68 kg)  06/20/23 150 lb (68 kg)  04/18/23 149 lb 3.2 oz (67.7 kg)  03/29/23 151 lb 6.4 oz (68.7 kg)  12/24/22 153 lb 2 oz (69.5 kg)  09/16/22 147 lb 6.4 oz (66.9 kg)   Constitutional: normal weight, in NAD Eyes:  EOMI, no exophthalmos ENT: no neck masses, no cervical lymphadenopathy Cardiovascular: RRR, No MRG Respiratory: CTA B Musculoskeletal: no deformities Skin:no rashes Neurological: no tremor with outstretched hands Diabetic Foot Exam - Simple   Simple Foot Form Diabetic Foot exam was performed with the following findings: Yes 07/05/2024  3:57 PM  Visual Inspection No deformities, no ulcerations, no other skin breakdown bilaterally: Yes Sensation Testing Intact to touch and monofilament testing bilaterally: Yes Pulse Check Posterior Tibialis and Dorsalis pulse intact bilaterally: Yes Comments    ASSESSMENT: 1. 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 HL  PLAN:  1. Patient with longstanding, fairly well-controlled type 2 diabetes, on metformin , SGLT inhibitor, sulfonylurea, and weekly GLP-1 receptor agonist, with fair control.  At last visit, HbA1c was slightly higher, at 6.7%, still at goal.  Despite switching from Trulicity  to  Ozempic , she had the same pattern in the blood sugars, at goal during the night, and increasing midday and with an improvement in the evening.  The pattern was present all days of the week including weekends.  We discussed about increasing the dose of Ozempic  to 1 mg weekly and possibly stopping glipizide  if the sugars improved. - At today's visit, her sugars appear to be mostly at goal before meals, but they increase up to almost 180s after meals.  She was not able to decrease the dose of glipizide  since last visit, despite increasing the Ozempic  dose, but she was previously taking 3 doses of metformin  a day, now stopped dose at night due to low blood sugars during the night.  We discussed that these could be related to glipizide , but she did see an improvement in blood sugars after stopping the metformin  at night.  As of now, most of the sugars are fluctuating within the target range per review of her CGM tracings on her phone, but she does have occasional hyperglycemic spikes especially related to increased stress.  We discussed about continuing the same regimen for now. - I suggested to:  Patient Instructions  Please continue: - Metformin  ER 500 mg with breakfast and lunch - Farxiga  10 mg before b'fast - Glipizide  10 mg before b'fast and 5 mg before lunch and dinner - Ozempic  1 mg weekly  Please return in 6 months.   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 HL -Latest lipid panel was reviewed from 12/2023: LDL above our target of less than 70, otherwise fractions at goal: Lab Results  Component Value Date   CHOL 172 12/30/2023   HDL 61 12/30/2023   LDLCALC 92 12/30/2023   TRIG 103 12/30/2023   CHOLHDL 2.8 12/30/2023  -She  had muscle aches and bone pain from statins and muscle pains from Zetia  even at the low dose of 5 mg daily -She started to go to the lipid clinic in the past but did not go recently. I advised her to reestablish care with them, as she could not go previously due to being busy with her husband. -PAs for several cholesterol medications were denied - an appeal was sent with information about her brother dying at 46 from massive heart attack and his father dying in his 62s from heart disease.   Lela Fendt, MD PhD [facility]

## 2024-07-24 ENCOUNTER — Encounter: Payer: Self-pay | Admitting: Family Medicine

## 2024-07-24 ENCOUNTER — Other Ambulatory Visit (HOSPITAL_BASED_OUTPATIENT_CLINIC_OR_DEPARTMENT_OTHER): Payer: Self-pay

## 2024-07-24 ENCOUNTER — Other Ambulatory Visit: Payer: Self-pay

## 2024-07-24 MED ORDER — TRAZODONE HCL 100 MG PO TABS
100.0000 mg | ORAL_TABLET | Freq: Every day | ORAL | 3 refills | Status: AC
Start: 2024-07-24 — End: ?

## 2024-07-24 MED ORDER — FLUTICASONE PROPIONATE 50 MCG/ACT NA SUSP: 1.0000 | Freq: Every day | NASAL | 1 refills | Status: DC

## 2024-09-11 ENCOUNTER — Other Ambulatory Visit: Payer: Self-pay | Admitting: Family Medicine

## 2024-09-11 NOTE — Telephone Encounter (Signed)
 Last OV: 09/20/2023  Next OV: 09/20/2024  Last Refill: 09/20/2023  Dispense: 90/3

## 2024-09-20 ENCOUNTER — Ambulatory Visit: Payer: Managed Care, Other (non HMO) | Admitting: Family Medicine

## 2024-09-20 ENCOUNTER — Encounter: Payer: Self-pay | Admitting: Family Medicine

## 2024-09-20 VITALS — BP 108/76 | HR 88 | Temp 97.2°F | Ht 69.0 in | Wt 139.6 lb

## 2024-09-20 DIAGNOSIS — I152 Hypertension secondary to endocrine disorders: Secondary | ICD-10-CM

## 2024-09-20 DIAGNOSIS — F411 Generalized anxiety disorder: Secondary | ICD-10-CM

## 2024-09-20 DIAGNOSIS — E782 Mixed hyperlipidemia: Secondary | ICD-10-CM

## 2024-09-20 DIAGNOSIS — Z0001 Encounter for general adult medical examination with abnormal findings: Secondary | ICD-10-CM | POA: Diagnosis not present

## 2024-09-20 DIAGNOSIS — Z7985 Long-term (current) use of injectable non-insulin antidiabetic drugs: Secondary | ICD-10-CM | POA: Diagnosis not present

## 2024-09-20 DIAGNOSIS — E1159 Type 2 diabetes mellitus with other circulatory complications: Secondary | ICD-10-CM | POA: Diagnosis not present

## 2024-09-20 DIAGNOSIS — E1142 Type 2 diabetes mellitus with diabetic polyneuropathy: Secondary | ICD-10-CM | POA: Diagnosis not present

## 2024-09-20 DIAGNOSIS — J301 Allergic rhinitis due to pollen: Secondary | ICD-10-CM

## 2024-09-20 DIAGNOSIS — F5104 Psychophysiologic insomnia: Secondary | ICD-10-CM | POA: Diagnosis not present

## 2024-09-20 DIAGNOSIS — E1169 Type 2 diabetes mellitus with other specified complication: Secondary | ICD-10-CM

## 2024-09-20 MED ORDER — FLUTICASONE PROPIONATE 50 MCG/ACT NA SUSP: 1.0000 | Freq: Every day | NASAL | 11 refills | Status: AC

## 2024-09-20 MED ORDER — AZELASTINE HCL 0.1 % NA SOLN: 2.0000 | Freq: Two times a day (BID) | NASAL | 1 refills | Status: AC | PRN

## 2024-09-20 MED ORDER — SERTRALINE HCL 100 MG PO TABS: 150.0000 mg | ORAL_TABLET | Freq: Every day | ORAL | Status: DC

## 2024-09-20 NOTE — Patient Instructions (Signed)
 Please return in 6 weeks to recheck sleep and mood  I will release your lab results to you on your MyChart account with further instructions. You may see the results before I do, but when I review them I will send you a message with my report or have my assistant call you if things need to be discussed. Please reply to my message with any questions. Thank you!   If you have any questions or concerns, please don't hesitate to send me a message via MyChart or call the office at [phone]. Thank you for visiting with us  today! It's our pleasure caring for you.    VISIT SUMMARY: During your visit, we discussed your increased stress and sleep disturbances, which are being affected by your caregiving responsibilities and your husband's work-related stress. We reviewed your current medications and made some adjustments to help manage your symptoms better.  YOUR PLAN: -GENERALIZED ANXIETY DISORDER: Generalized anxiety disorder is a condition characterized by chronic anxiety and excessive worry. We have increased your sertraline  dose to 150 mg daily for the next 6-12 weeks to see if it helps with your increased anxiety symptoms. Additionally, we recommend taking 1-2 capsules of magnesium glycinate at night and practicing guided meditation and deep breathing exercises to help manage your anxiety.  -PSYCHOPHYSIOLOGIC INSOMNIA: Psychophysiologic insomnia is a type of sleep disorder where stress and anxiety make it difficult to stay asleep. You should continue taking trazodone  for sleep and start taking 1-2 capsules of magnesium glycinate at night. We also encourage you to practice guided meditation and deep breathing exercises to improve your sleep hygiene.  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 Combined  hyperlipidemia means you have high levels of fats in your blood. Your diabetes management appears well-controlled, but we need to do lab work to check your current diabetes and lipid levels. Continue with your current diabetes management plan.  INSTRUCTIONS: Please follow up with the lab work we ordered to assess your current diabetes and lipid levels. Continue with your current medications and the new adjustments we discussed. Practice guided meditation and deep breathing exercises regularly to help manage your anxiety and improve your sleep.                      Contains text generated by [employer].                                 Contains text generated by [employer].

## 2024-09-20 NOTE — Progress Notes (Signed)
 Subjective  Chief Complaint  Patient presents with   Annual Exam    Here for annual exam. Has fasted for labs. Feeling more depressed lately and her blood sugars are up.     HPI: Charlotte Horn is a 63 y.o. female who presents to [facility] [place] today for a Female Wellness Visit. She also has the concerns and/or needs as listed above in the chief complaint. These will be addressed in addition to the Health Maintenance Visit.   Wellness Visit: annual visit with health maintenance review and exam  HM: mammogram due soon. Pap and CRC screens are current. Imms are up to date.   Chronic disease f/u and/or acute problem visit: (deemed necessary to be done in addition to the wellness visit): Discussed the use of AI scribe software for clinical note transcription with the patient, who gave verbal consent to proceed.  History of Present Illness Charlotte Horn is a 63 year old female who presents with increased stress and sleep disturbances.  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, following her husband's sister's relocation - Increased caregiving responsibilities and household demands - Husband experiencing work-related stress and considering retirement, contributing to her stress - Daily life is very busy with little time for herself due to work and caregiving - Difficulty finding time for household chores and personal activities - Recently engaged in a puzzle and ordered a book for relaxation  Mood disturbances - Increased irritability and frequent episodes of snapping, attributed to current stress levels - Attempted to identify medication-related mood changes; discontinued steroid nasal spray and azelastine , resulting in slight improvement in irritability but increased nasal symptoms - On sertraline  (Zoloft ) since 2004, currently at 100 mg daily - Uncertain if sertraline  remains effective for mood  management given current stressors - Recalls previously taking a higher dose of sertraline   Sleep disturbances - Difficulty staying asleep, with frequent nighttime awakenings and trouble returning to sleep - Uses trazodone  for sleep; increasing dose previously caused excessive daytime drowsiness - Takes magnesium complex at night, which provides slight benefit  HLD on statin with good control due for recheck. Tolerates well healthy diet  HTN is well controlled. No cp sob or edema  DM managed by endocrine and notes reviewed. Last A1c 6.3 07/05/2024. Reports stress has her fastings up a bit now. But has had good control overall. No sxs of hyperglycemia. Longterm neuropathy is stable. No foot sores. Eye exam is current.   GAD on zoloft  : see above.   Allergies are active with rhinorrhea since stopping astelin  and flonase . Would like to restart.     Assessment  1. Encounter for well adult exam with abnormal findings   2. Combined hyperlipidemia associated with type 2 diabetes mellitus (HCC)   3. Hypertension associated with diabetes (HCC)   4. Diabetic peripheral neuropathy associated with type 2 diabetes mellitus (HCC)   5. Well controlled type 2 diabetes mellitus with peripheral neuropathy (HCC)   6. Long-term (current) use of injectable non-insulin antidiabetic drugs   7. GAD (generalized anxiety disorder)   8. Psychophysiological insomnia      Plan  Female Wellness Visit: Age appropriate Health Maintenance and Prevention measures were discussed with patient. Included topics are cancer screening recommendations, ways to keep healthy (see AVS) including dietary and exercise recommendations, regular eye and dental care, use of seat belts, and avoidance of moderate alcohol use and tobacco use. Mammogram upcoming BMI: discussed patient's BMI and  encouraged positive lifestyle modifications to help get to or maintain a target BMI. HM needs and immunizations were addressed and ordered. See  below for orders. See HM and immunization section for updates. Routine labs and screening tests ordered including cmp, cbc and lipids where appropriate. Discussed recommendations regarding Vit D and calcium supplementation (see AVS)  Chronic disease management visit and/or acute problem visit: Assessment and Plan Assessment & Plan Generalized anxiety disorder Chronic anxiety exacerbated by recent life stressors, including caregiving responsibilities and husband's job issues. Current medication includes sertraline  100 mg daily, which has been effective long-term. Considering increasing sertraline  to 150 mg daily for short-term management of increased anxiety symptoms. Discussed potential benefits of magnesium glycinate for sleep and brain health. Encouraged lifestyle modifications such as guided meditation and deep breathing exercises to manage anxiety. - Increased sertraline  to 150 mg daily for 6-12 weeks to assess efficacy. - Recommended magnesium glycinate supplementation, 1-2 capsules at night. - Encouraged use of guided meditation and deep breathing exercises for anxiety management.  Psychophysiologic insomnia Chronic insomnia with difficulty maintaining sleep, exacerbated by stress and anxiety. Current treatment includes trazodone  for sleep. Discussed potential benefits of magnesium glycinate for improving sleep quality. Encouraged lifestyle modifications such as guided meditation and deep breathing exercises to improve sleep hygiene. - Continue trazodone  for sleep. - Recommended magnesium glycinate supplementation, 1-2 capsules at night. - Encouraged use of guided meditation and deep breathing exercises to improve sleep hygiene.  Type 2 diabetes mellitus with diabetic polyneuropathy and combined hyperlipidemia Diabetes management appears well-controlled based on recent A1c levels, though stress may impact blood glucose levels. Hyperlipidemia management requires follow-up with lab work to  assess current status. - Ordered lab work to assess current diabetes and lipid control. - Continue current diabetes management plan.  HTN is controlled. Check renal function. No change in meds.   Restart allergy medications.     Follow up: 6  week to recheck mood/stress  Orders Placed This Encounter  Procedures   CBC with Differential/Platelet   Comprehensive metabolic panel with GFR   Lipid panel   TSH   Microalbumin / creatinine urine ratio   Meds ordered this encounter  Medications   azelastine  (ASTELIN ) 0.1 % nasal spray    Sig: Place 2 sprays into both nostrils 2 (two) times daily as needed for rhinitis. Use in each nostril as directed    Dispense:  90 mL    Refill:  1   fluticasone  (FLONASE ) 50 MCG/ACT nasal spray    Sig: Place 1 spray into both nostrils daily.    Dispense:  16 g    Refill:  11      Body mass index is 20.62 kg/m. Wt Readings from Last 3 Encounters:  09/20/24 139 lb 9.6 oz (63.3 kg)  07/05/24 137 lb (62.1 kg)  12/23/23 143 lb 9.6 oz (65.1 kg)     Patient Active Problem List   Diagnosis Date Noted Date Diagnosed   Diabetic peripheral neuropathy associated with type 2 diabetes mellitus (HCC) 06/01/2021     Priority: High   GAD (generalized anxiety disorder) 09/03/2020     Priority: High    zoloft     Hypertension associated with diabetes (HCC) 08/28/2019     Priority: High   Well controlled type 2 diabetes mellitus with peripheral neuropathy (HCC) 06/10/2017     Priority: High   Combined hyperlipidemia associated with type 2 diabetes mellitus (HCC) 04/11/2017     Priority: High   Family history of early CAD 04/11/2017  Priority: High   Psychophysiological insomnia 09/03/2020     Priority: Medium     trazadone    Seasonal allergic rhinitis 08/16/2018     Priority: Medium    Extrinsic asthma 01/18/2008     Priority: Medium    Bilateral temporomandibular joint pain 09/15/2021     Priority: Low   Chronic sinusitis 08/16/2018      Priority: Low   Health Maintenance  Topic Date Due   Mammogram  09/22/2024   OPHTHALMOLOGY EXAM  09/21/2024   Diabetic kidney evaluation - eGFR measurement  12/29/2024   Diabetic kidney evaluation - Urine ACR  12/29/2024   HEMOGLOBIN A1C  01/03/2025   FOOT EXAM  07/05/2025   Pneumococcal Vaccine: 50+ Years (3 of 3 - PCV20 or PCV21) 09/03/2025   DTaP/Tdap/Td (3 - Td or Tdap) 09/04/2027   Cervical Cancer Screening (HPV/Pap Cotest)  09/19/2028   Colonoscopy  07/07/2033   Influenza Vaccine  Completed   COVID-19 Vaccine  Completed   Hepatitis C Screening  Completed   Zoster Vaccines- Shingrix  Completed   Hepatitis B Vaccines 19-59 Average Risk  Aged Out   HPV VACCINES  Aged Out   Meningococcal B Vaccine  Aged Out   HIV Screening  Discontinued   Immunization History  Administered Date(s) Administered   Influenza, Seasonal, Injecte, Preservative Fre 07/26/2023, 07/05/2024   Influenza,inj,Quad PF,6+ Mos 07/25/2017, 07/11/2018, 06/28/2019, 07/04/2022   Influenza-Unspecified 07/03/2020, 07/17/2021   PFIZER Comirnaty ETTERGray Top)Covid-19 Tri-Sucrose Vaccine 02/04/2021   PFIZER(Purple Top)SARS-COV-2 Vaccination 12/06/2019, 12/27/2019, 07/03/2020   Pfizer(Comirnaty )Fall Seasonal Vaccine 12 years and older 08/19/2022, 07/26/2023, 07/05/2024   Pneumococcal Conjugate-13 08/15/2016, 08/01/2018   Pneumococcal Polysaccharide-23 10/04/2006, 10/06/2015, 09/03/2020   Respiratory Syncytial Virus Vaccine ,Recomb Aduvanted(Arexvy ) 09/16/2022   Tdap 04/22/2015, 09/03/2017   Unspecified SARS-COV-2 Vaccination 12/07/2019, 12/22/2019, 06/04/2020   Zoster Recombinant(Shingrix) 07/01/2019, 03/04/2021   Zoster, Live 06/04/2009   We updated and reviewed the patient's past history in detail and it is documented below. Allergies: Patient is allergic to insulin lispro, prednisone , statins, atorvastatin, and cetirizine  hcl. Past Medical History Patient  has a past medical history of Anxiety, Arthritis, Asthma,  Deviated septum (08/16/2018), Diabetes mellitus without complication (HCC), Family history of early CAD (04/11/2017), Hyperlipidemia (04/11/2017), Hypertension, and Seasonal allergies. Past Surgical History Patient  has a past surgical history that includes Cholecystectomy; Cesarean section; Colonoscopy (2014); and Eye surgery. Family History: Patient family history includes Alcohol abuse in her brother; Arthritis in her maternal grandmother and mother; Asthma in her brother; COPD in her maternal grandmother; Cancer in her maternal grandmother and mother; Depression in her mother; Diabetes in her brother, brother, and father; Early death in her brother; Heart attack in her brother and father; Heart disease in her brother, brother, and father; Heart failure in her paternal grandmother; Hyperlipidemia in her brother, brother, and father; Hypertension in her brother; Kidney disease in her father; Kidney failure in her father; Ovarian cancer in her maternal grandmother and mother; Parkinson's disease in her maternal grandfather; Sudden Cardiac Death in her brother. Social History:  Patient  reports that she has never smoked. She has never been exposed to tobacco smoke. She has never used smokeless tobacco. She reports current alcohol use. She reports that she does not use drugs.  Review of Systems: Constitutional: negative for fever or malaise Ophthalmic: negative for photophobia, double vision or loss of vision Cardiovascular: negative for chest pain, dyspnea on exertion, or new LE swelling Respiratory: negative for SOB or persistent cough Gastrointestinal: negative for abdominal pain, change in  bowel habits or melena Genitourinary: negative for dysuria or gross hematuria, no abnormal uterine bleeding or disharge Musculoskeletal: negative for new gait disturbance or muscular weakness Integumentary: negative for new or persistent rashes, no breast lumps Neurological: negative for TIA or stroke  symptoms Psychiatric: negative for SI or delusions Allergic/Immunologic: negative for hives  Patient Care Team    Relationship Specialty Notifications Start End  Jodie Lavern CROME, MD PCP - General Family Medicine  09/03/20   Raford Riggs, MD PCP - Cardiology Cardiology  10/17/20   Darcel Pool, MD Consulting Physician Obstetrics and Gynecology  09/03/20   Trixie File, MD Consulting Physician Endocrinology  09/03/20   Mona Vinie BROCKS, MD Consulting Physician Cardiology  09/03/20    Comment: lipids clinic  Raford Riggs, MD Consulting Physician Cardiology  09/03/20     Objective  Vitals: BP 108/76 (BP Location: Left Arm, Patient Position: Sitting, Cuff Size: Normal)   Pulse 88   Temp (!) 97.2 F (36.2 C) (Temporal)   Ht 5' 9 (1.753 m)   Wt 139 lb 9.6 oz (63.3 kg)   SpO2 98%   BMI 20.62 kg/m  General:  Well developed, well nourished, no acute distress  Psych:  Alert and orientedx3,anxious mood and affect HEENT:  Normocephalic, atraumatic, non-icteric sclera,  supple neck without adenopathy, mass or thyromegaly + rhinorrhea Cardiovascular:  Normal S1, S2, RRR without gallop, rub or murmur Respiratory:  Good breath sounds bilaterally, CTAB with normal respiratory effort Gastrointestinal: normal bowel sounds, soft, non-tender, no noted masses. No HSM MSK: extremities without edema, joints without erythema or swelling Neurologic:    Mental status is normal.  Gross motor and sensory exams are normal.  No tremor  Commons side effects, risks, benefits, and alternatives for medications and treatment plan prescribed today were discussed, and the patient expressed understanding of the given instructions. Patient is instructed to call or message via MyChart if he/she has any questions or concerns regarding our treatment plan. No barriers to understanding were identified. We discussed Red Flag symptoms and signs in detail. Patient expressed understanding regarding what to do in case of  urgent or emergency type symptoms.  Medication list was reconciled, printed and provided to the patient in AVS. Patient instructions and summary information was reviewed with the patient as documented in the AVS. This note was prepared with assistance of Dragon voice recognition software. Occasional wrong-word or sound-a-like substitutions may have occurred due to the inherent limitations of voice recognition software

## 2024-09-21 LAB — COMPREHENSIVE METABOLIC PANEL WITH GFR
AG Ratio: 1.6 (calc) (ref 1.0–2.5)
ALT: 14 U/L (ref 6–29)
AST: 12 U/L (ref 10–35)
Albumin: 4.2 g/dL (ref 3.6–5.1)
Alkaline phosphatase (APISO): 76 U/L (ref 37–153)
BUN: 17 mg/dL (ref 7–25)
CO2: 25 mmol/L (ref 20–32)
Calcium: 9 mg/dL (ref 8.6–10.4)
Chloride: 102 mmol/L (ref 98–110)
Creat: 0.67 mg/dL (ref 0.50–1.05)
Globulin: 2.6 g/dL (ref 1.9–3.7)
Glucose, Bld: 127 mg/dL — ABNORMAL HIGH (ref 65–99)
Potassium: 4.1 mmol/L (ref 3.5–5.3)
Sodium: 138 mmol/L (ref 135–146)
Total Bilirubin: 0.3 mg/dL (ref 0.2–1.2)
Total Protein: 6.8 g/dL (ref 6.1–8.1)
eGFR: 98 mL/min/1.73m2

## 2024-09-21 LAB — CBC WITH DIFFERENTIAL/PLATELET
Absolute Lymphocytes: 1670 {cells}/uL (ref 850–3900)
Absolute Monocytes: 410 {cells}/uL (ref 200–950)
Basophils Absolute: 43 {cells}/uL (ref 0–200)
Basophils Relative: 0.6 %
Eosinophils Absolute: 734 {cells}/uL — ABNORMAL HIGH (ref 15–500)
Eosinophils Relative: 10.2 %
HCT: 40.8 % (ref 35.9–46.0)
Hemoglobin: 12.9 g/dL (ref 11.7–15.5)
MCH: 27 pg (ref 27.0–33.0)
MCHC: 31.6 g/dL (ref 31.6–35.4)
MCV: 85.5 fL (ref 81.4–101.7)
MPV: 10.2 fL (ref 7.5–12.5)
Monocytes Relative: 5.7 %
Neutro Abs: 4342 {cells}/uL (ref 1500–7800)
Neutrophils Relative %: 60.3 %
Platelets: 315 Thousand/uL (ref 140–400)
RBC: 4.77 Million/uL (ref 3.80–5.10)
RDW: 13.4 % (ref 11.0–15.0)
Total Lymphocyte: 23.2 %
WBC: 7.2 Thousand/uL (ref 3.8–10.8)

## 2024-09-21 LAB — LIPID PANEL
Cholesterol: 197 mg/dL
HDL: 69 mg/dL
LDL Cholesterol (Calc): 112 mg/dL — ABNORMAL HIGH
Non-HDL Cholesterol (Calc): 128 mg/dL
Total CHOL/HDL Ratio: 2.9 (calc)
Triglycerides: 69 mg/dL

## 2024-09-21 LAB — MICROALBUMIN / CREATININE URINE RATIO
Creatinine, Urine: 50 mg/dL (ref 20–275)
Microalb, Ur: <0.2 mg/dL

## 2024-09-21 LAB — TSH: TSH: 2.7 m[IU]/L (ref 0.40–4.50)

## 2024-09-28 ENCOUNTER — Ambulatory Visit: Payer: Self-pay | Admitting: Family Medicine

## 2024-09-28 NOTE — Progress Notes (Signed)
 See mychart note LDL 112; intolerant to statins and zetia . Has been seen by Dr. Mona in the past

## 2024-10-01 ENCOUNTER — Other Ambulatory Visit (HOSPITAL_BASED_OUTPATIENT_CLINIC_OR_DEPARTMENT_OTHER): Payer: Self-pay

## 2024-10-21 ENCOUNTER — Other Ambulatory Visit: Payer: Self-pay | Admitting: Internal Medicine

## 2024-10-31 ENCOUNTER — Other Ambulatory Visit (HOSPITAL_BASED_OUTPATIENT_CLINIC_OR_DEPARTMENT_OTHER): Payer: Self-pay | Admitting: Family Medicine

## 2024-10-31 DIAGNOSIS — Z1231 Encounter for screening mammogram for malignant neoplasm of breast: Secondary | ICD-10-CM

## 2024-11-02 ENCOUNTER — Ambulatory Visit (HOSPITAL_BASED_OUTPATIENT_CLINIC_OR_DEPARTMENT_OTHER): Admission: RE | Admit: 2024-11-02 | Discharge: 2024-11-02 | Disposition: A | Source: Ambulatory Visit

## 2024-11-02 ENCOUNTER — Encounter (HOSPITAL_BASED_OUTPATIENT_CLINIC_OR_DEPARTMENT_OTHER): Payer: Self-pay | Admitting: Radiology

## 2024-11-02 DIAGNOSIS — Z1231 Encounter for screening mammogram for malignant neoplasm of breast: Secondary | ICD-10-CM | POA: Diagnosis present

## 2024-11-04 ENCOUNTER — Encounter: Payer: Self-pay | Admitting: Family Medicine

## 2024-11-05 ENCOUNTER — Encounter: Payer: Self-pay | Admitting: Internal Medicine

## 2024-11-05 MED ORDER — SERTRALINE HCL 100 MG PO TABS: 150.0000 mg | ORAL_TABLET | Freq: Every day | ORAL | 3 refills | Status: AC

## 2024-11-05 MED ORDER — LOSARTAN POTASSIUM 50 MG PO TABS: 25.0000 mg | ORAL_TABLET | Freq: Every day | ORAL | 3 refills | Status: AC

## 2024-11-06 MED ORDER — GLIPIZIDE 5 MG PO TABS: ORAL_TABLET | ORAL | 2 refills | Status: AC

## 2025-01-11 ENCOUNTER — Ambulatory Visit: Admitting: Internal Medicine
